# Patient Record
Sex: Female | Born: 1950
Health system: Southern US, Community
[De-identification: ages and names within clinical notes are randomized; demographics above are authoritative.]

## PROBLEM LIST (undated history)

## (undated) DIAGNOSIS — M1990 Unspecified osteoarthritis, unspecified site: Secondary | ICD-10-CM

## (undated) DIAGNOSIS — I2510 Atherosclerotic heart disease of native coronary artery without angina pectoris: Secondary | ICD-10-CM

## (undated) DIAGNOSIS — K219 Gastro-esophageal reflux disease without esophagitis: Secondary | ICD-10-CM

## (undated) DIAGNOSIS — E119 Type 2 diabetes mellitus without complications: Secondary | ICD-10-CM

## (undated) DIAGNOSIS — Z9889 Other specified postprocedural states: Secondary | ICD-10-CM

## (undated) DIAGNOSIS — E782 Mixed hyperlipidemia: Secondary | ICD-10-CM

## (undated) DIAGNOSIS — G4700 Insomnia, unspecified: Secondary | ICD-10-CM

## (undated) DIAGNOSIS — R51 Headache: Secondary | ICD-10-CM

## (undated) DIAGNOSIS — R112 Nausea with vomiting, unspecified: Secondary | ICD-10-CM

## (undated) DIAGNOSIS — R519 Headache, unspecified: Secondary | ICD-10-CM

## (undated) DIAGNOSIS — I499 Cardiac arrhythmia, unspecified: Secondary | ICD-10-CM

## (undated) DIAGNOSIS — Z8659 Personal history of other mental and behavioral disorders: Secondary | ICD-10-CM

## (undated) DIAGNOSIS — J189 Pneumonia, unspecified organism: Secondary | ICD-10-CM

## (undated) DIAGNOSIS — T8859XA Other complications of anesthesia, initial encounter: Secondary | ICD-10-CM

## (undated) DIAGNOSIS — T4145XA Adverse effect of unspecified anesthetic, initial encounter: Secondary | ICD-10-CM

## (undated) DIAGNOSIS — K7581 Nonalcoholic steatohepatitis (NASH): Secondary | ICD-10-CM

## (undated) DIAGNOSIS — I6529 Occlusion and stenosis of unspecified carotid artery: Secondary | ICD-10-CM

## (undated) DIAGNOSIS — I10 Essential (primary) hypertension: Secondary | ICD-10-CM

## (undated) HISTORY — PX: BUNIONECTOMY: SHX129

## (undated) HISTORY — PX: CHOLECYSTECTOMY: SHX55

## (undated) HISTORY — PX: CARDIAC CATHETERIZATION: SHX172

## (undated) HISTORY — PX: TUBAL LIGATION: SHX77

## (undated) HISTORY — PX: GANGLION CYST EXCISION: SHX1691

## (undated) HISTORY — PX: SHOULDER ARTHROSCOPY W/ ROTATOR CUFF REPAIR: SHX2400

## (undated) HISTORY — PX: CARPAL TUNNEL RELEASE: SHX101

## (undated) HISTORY — PX: MYELOGRAM: SHX5347

## (undated) HISTORY — PX: BACK SURGERY: SHX140

---

## 2014-01-30 ENCOUNTER — Other Ambulatory Visit: Payer: Self-pay

## 2014-01-30 DIAGNOSIS — Z1231 Encounter for screening mammogram for malignant neoplasm of breast: Secondary | ICD-10-CM

## 2014-03-02 ENCOUNTER — Ambulatory Visit
Admission: RE | Admit: 2014-03-02 | Discharge: 2014-03-02 | Disposition: A | Discharge status: Home / Self Care | Payer: BC Managed Care – PPO | Source: Ambulatory Visit

## 2014-03-02 ENCOUNTER — Other Ambulatory Visit: Payer: Self-pay | Admitting: Neurosurgery

## 2014-03-02 DIAGNOSIS — Z1231 Encounter for screening mammogram for malignant neoplasm of breast: Secondary | ICD-10-CM

## 2014-03-02 DIAGNOSIS — M545 Low back pain: Secondary | ICD-10-CM

## 2014-03-02 DIAGNOSIS — M549 Dorsalgia, unspecified: Secondary | ICD-10-CM

## 2014-03-13 ENCOUNTER — Ambulatory Visit
Admission: RE | Admit: 2014-03-13 | Discharge: 2014-03-13 | Disposition: A | Discharge status: Home / Self Care | Payer: BC Managed Care – PPO | Source: Ambulatory Visit | Attending: Neurosurgery | Admitting: Neurosurgery

## 2014-03-13 VITALS — BP 146/66

## 2014-03-13 DIAGNOSIS — M545 Low back pain: Secondary | ICD-10-CM

## 2014-03-13 DIAGNOSIS — M549 Dorsalgia, unspecified: Secondary | ICD-10-CM

## 2014-03-13 MED ORDER — IOHEXOL 300 MG/ML  SOLN
10.0000 mL | Freq: Once | INTRAMUSCULAR | Status: AC | PRN
  Administered 2014-03-13: 10 mL via INTRATHECAL

## 2014-03-13 MED ORDER — MEPERIDINE HCL 100 MG/ML IJ SOLN
100.0000 mg | Freq: Once | INTRAMUSCULAR | Status: AC
  Administered 2014-03-13: 100 mg via INTRAMUSCULAR

## 2014-03-13 MED ORDER — DIAZEPAM 5 MG PO TABS
10.0000 mg | ORAL_TABLET | Freq: Once | ORAL | Status: AC
  Administered 2014-03-13: 10 mg via ORAL

## 2014-03-13 MED ORDER — ONDANSETRON HCL 4 MG/2ML IJ SOLN
4.0000 mg | Freq: Once | INTRAMUSCULAR | Status: AC
  Administered 2014-03-13: 4 mg via INTRAMUSCULAR

## 2014-03-13 NOTE — Progress Notes (Signed)
Pt states she has been off Sertraline for the past several days.

## 2014-03-13 NOTE — Discharge Instructions (Signed)
Myelogram Discharge Instructions  1. Go home and rest quietly for the next 24 hours.  It is important to lie flat for the next 24 hours.  Get up only to go to the restroom.  You may lie in the bed or on a couch on your back, your stomach, your left side or your right side.  You may have one pillow under your head.  You may have pillows between your knees while you are on your side or under your knees while you are on your back.  2. DO NOT drive today.  Recline the seat as far back as it will go, while still wearing your seat belt, on the way home.  3. You may get up to go to the bathroom as needed.  You may sit up for 10 minutes to eat.  You may resume your normal diet and medications unless otherwise indicated.  Drink lots of extra fluids today and tomorrow.  4. The incidence of headache, nausea, or vomiting is about 5% (one in 20 patients).  If you develop a headache, lie flat and drink plenty of fluids until the headache goes away.  Caffeinated beverages may be helpful.  If you develop severe nausea and vomiting or a headache that does not go away with flat bed rest, call [phone].  5. You may resume normal activities after your 24 hours of bed rest is over; however, do not exert yourself strongly or do any heavy lifting tomorrow. If when you get up you have a headache when standing, go back to bed and force fluids for another 24 hours.  6. Call your physician for a follow-up appointment.  The results of your myelogram will be sent directly to your physician by the following day.  7. If you have any questions or if complications develop after you arrive home, please call [phone].  Discharge instructions have been explained to the patient.  The patient, or the person responsible for the patient, fully understands these instructions.    Myelogram Discharge Instructions  8. Go home and rest quietly for the next 24 hours.  It is important to lie flat for the next 24 hours.  Get up only to  go to the restroom.  You may lie in the bed or on a couch on your back, your stomach, your left side or your right side.  You may have one pillow under your head.  You may have pillows between your knees while you are on your side or under your knees while you are on your back.  9. DO NOT drive today.  Recline the seat as far back as it will go, while still wearing your seat belt, on the way home.  10. You may get up to go to the bathroom as needed.  You may sit up for 10 minutes to eat.  You may resume your normal diet and medications unless otherwise indicated.  Drink lots of extra fluids today and tomorrow.  11. The incidence of headache, nausea, or vomiting is about 5% (one in 20 patients).  If you develop a headache, lie flat and drink plenty of fluids until the headache goes away.  Caffeinated beverages may be helpful.  If you develop severe nausea and vomiting or a headache that does not go away with flat bed rest, call [phone].  12. You may resume normal activities after your 24 hours of bed rest is over; however, do not exert yourself strongly or do any heavy lifting tomorrow. If  when you get up you have a headache when standing, go back to bed and force fluids for another 24 hours.  13. Call your physician for a follow-up appointment.  The results of your myelogram will be sent directly to your physician by the following day.  14. If you have any questions or if complications develop after you arrive home, please call [phone].  Discharge instructions have been explained to the patient.  The patient, or the person responsible for the patient, fully understands these instructions.    Myelogram Discharge Instructions  15. Go home and rest quietly for the next 24 hours.  It is important to lie flat for the next 24 hours.  Get up only to go to the restroom.  You may lie in the bed or on a couch on your back, your stomach, your left side or your right side.  You may have one pillow  under your head.  You may have pillows between your knees while you are on your side or under your knees while you are on your back.  16. DO NOT drive today.  Recline the seat as far back as it will go, while still wearing your seat belt, on the way home.  17. You may get up to go to the bathroom as needed.  You may sit up for 10 minutes to eat.  You may resume your normal diet and medications unless otherwise indicated.  Drink lots of extra fluids today and tomorrow.  18. The incidence of headache, nausea, or vomiting is about 5% (one in 20 patients).  If you develop a headache, lie flat and drink plenty of fluids until the headache goes away.  Caffeinated beverages may be helpful.  If you develop severe nausea and vomiting or a headache that does not go away with flat bed rest, call [phone].  19. You may resume normal activities after your 24 hours of bed rest is over; however, do not exert yourself strongly or do any heavy lifting tomorrow. If when you get up you have a headache when standing, go back to bed and force fluids for another 24 hours.  20. Call your physician for a follow-up appointment.  The results of your myelogram will be sent directly to your physician by the following day.  21. If you have any questions or if complications develop after you arrive home, please call [phone].  Discharge instructions have been explained to the patient.  The patient, or the person responsible for the patient, fully understands these instructions.      May resume Sertraline on Sept. 30, 2015, after 11:00 am.

## 2014-03-20 DIAGNOSIS — M5416 Radiculopathy, lumbar region: Secondary | ICD-10-CM | POA: Insufficient documentation

## 2014-03-21 ENCOUNTER — Other Ambulatory Visit (HOSPITAL_COMMUNITY): Payer: Self-pay | Admitting: Neurosurgery

## 2014-03-23 ENCOUNTER — Encounter (HOSPITAL_COMMUNITY): Payer: Self-pay | Admitting: Pharmacy Technician

## 2014-03-29 NOTE — Pre-Procedure Instructions (Signed)
Elaine Roach  03/29/2014   Your procedure is scheduled on:  Wednesday, Oct. 21st   Report to [facility] Admitting at  10:00 AM, (this is per your Surgeon's request.)               Call this number if you have problems the morning of surgery: [phone]   Remember:   Do not eat food or drink liquids after midnight Tuesday.     Take these medicines the morning of surgery with A SIP OF WATER: Prilosec, Zoloft.              DO NOT TAKE ANY DIABETES MEDICATION THE MORNING OF SURGERY.   Do not wear jewelry, make-up or nail polish.  Do not wear lotions, powders, or perfumes. You may not wear deodorant the morning of surgery.  Do not shave underarms & legs 48 hours prior to surgery.    Do not bring valuables to the hospital.  [facility] is not responsible for any belongings or valuables.               Contacts, dentures or bridgework may not be worn into surgery.  Leave suitcase in the car. After surgery it may be brought to your room.  For patients admitted to the hospital, discharge time is determined by your treatment team.               Name and phone number of your driver:    Special Instructions: "Preparing for Surgery" instruction sheet.   Please read over the following fact sheets that you were given: Pain Booklet, Coughing and Deep Breathing, Blood Transfusion Information, MRSA Information and Surgical Site Infection Prevention

## 2014-03-30 ENCOUNTER — Encounter (HOSPITAL_COMMUNITY)
Admission: RE | Admit: 2014-03-30 | Discharge: 2014-03-30 | Disposition: A | Discharge status: Home / Self Care | Payer: BC Managed Care – PPO | Source: Ambulatory Visit | Attending: Neurosurgery | Admitting: Neurosurgery

## 2014-03-30 ENCOUNTER — Ambulatory Visit (HOSPITAL_COMMUNITY)
Admission: RE | Admit: 2014-03-30 | Discharge: 2014-03-30 | Disposition: A | Discharge status: Home / Self Care | Payer: BC Managed Care – PPO | Source: Ambulatory Visit | Attending: Anesthesiology | Admitting: Anesthesiology

## 2014-03-30 ENCOUNTER — Encounter (HOSPITAL_COMMUNITY): Payer: Self-pay

## 2014-03-30 DIAGNOSIS — M5126 Other intervertebral disc displacement, lumbar region: Secondary | ICD-10-CM | POA: Diagnosis not present

## 2014-03-30 DIAGNOSIS — Z01811 Encounter for preprocedural respiratory examination: Secondary | ICD-10-CM | POA: Diagnosis present

## 2014-03-30 HISTORY — DX: Adverse effect of unspecified anesthetic, initial encounter: T41.45XA

## 2014-03-30 HISTORY — DX: Nausea with vomiting, unspecified: R11.2

## 2014-03-30 HISTORY — DX: Essential (primary) hypertension: I10

## 2014-03-30 HISTORY — DX: Cardiac arrhythmia, unspecified: I49.9

## 2014-03-30 HISTORY — DX: Gastro-esophageal reflux disease without esophagitis: K21.9

## 2014-03-30 HISTORY — DX: Unspecified osteoarthritis, unspecified site: M19.90

## 2014-03-30 HISTORY — DX: Type 2 diabetes mellitus without complications: E11.9

## 2014-03-30 HISTORY — DX: Other complications of anesthesia, initial encounter: T88.59XA

## 2014-03-30 HISTORY — DX: Personal history of other mental and behavioral disorders: Z86.59

## 2014-03-30 HISTORY — DX: Nonalcoholic steatohepatitis (NASH): K75.81

## 2014-03-30 HISTORY — DX: Other specified postprocedural states: Z98.890

## 2014-03-30 LAB — CBC
HCT: 40.7 % (ref 36.0–46.0)
Hemoglobin: 13.3 g/dL (ref 12.0–15.0)
MCH: 28.1 pg (ref 26.0–34.0)
MCHC: 32.7 g/dL (ref 30.0–36.0)
MCV: 86 fL (ref 78.0–100.0)
Platelets: 277 10*3/uL (ref 150–400)
RBC: 4.73 MIL/uL (ref 3.87–5.11)
RDW: 14 % (ref 11.5–15.5)
WBC: 5.5 10*3/uL (ref 4.0–10.5)

## 2014-03-30 LAB — SURGICAL PCR SCREEN
MRSA, PCR: NEGATIVE
Staphylococcus aureus: NEGATIVE

## 2014-03-30 LAB — BASIC METABOLIC PANEL
Anion gap: 15 (ref 5–15)
BUN: 22 mg/dL (ref 6–23)
CO2: 23 mEq/L (ref 19–32)
Calcium: 10.4 mg/dL (ref 8.4–10.5)
Chloride: 100 mEq/L (ref 96–112)
Creatinine, Ser: 0.81 mg/dL (ref 0.50–1.10)
GFR calc Af Amer: 88 mL/min — ABNORMAL LOW (ref >90)
GFR calc non Af Amer: 76 mL/min — ABNORMAL LOW (ref >90)
Glucose, Bld: 109 mg/dL — ABNORMAL HIGH (ref 70–99)
Potassium: 4.3 mEq/L (ref 3.7–5.3)
Sodium: 138 mEq/L (ref 137–147)

## 2014-03-30 LAB — ABO/RH: ABO/RH(D): A POS

## 2014-03-30 LAB — TYPE AND SCREEN
ABO/RH(D): A POS
Antibody Screen: NEGATIVE

## 2014-03-30 NOTE — Progress Notes (Signed)
Anesthesia Chart Review:   Pt is 63 year old female scheduled for L1-2 PLIF with interbody prosthesis, posterior lateral arthrodesis and posterior segmental instrumentation with exploration of fusion on 04/04/2014 with Dr. Arnoldo Morale.   PMH: HTN, DM, nonalcoholic steatohepatitis, dysrhythmia (PVCs), GERD, depression. Heavy smoker.   Medications include: lisinopril/hctz, metformin, pravastatin, prilosec, zoloft  Preoperative labs reviewed.    Chest x-ray reviewed. No acute abnormalities.   EKG: NSR. Cannot rule out anterior infarct, age undetermined.   No sx documented at PAT.  If no changes, I anticipate pt can proceed with surgery as scheduled.   Willeen Cass, FNP-BC [facility]/Anesthesiology Phone: [phone] 03/30/2014 3:55 PM

## 2014-03-30 NOTE — Progress Notes (Addendum)
Patient was having some "beats" was tested 15-18 yrs ago in [place]...wore a holter monitor and it showed benign PVC's.....Marland Kitchenstill has them every now and then.   Gets 'flutters'  2-3 times a week.....Marland Kitchenthen they go away.   DA Today's EKG 'abnormal'.Marland KitchenMarland KitchenShe did not have an old one to compare.  DA

## 2014-04-03 MED ORDER — CEFAZOLIN SODIUM-DEXTROSE 2-3 GM-% IV SOLR
2.0000 g | INTRAVENOUS | Status: AC
  Administered 2014-04-04: 2 g via INTRAVENOUS
  Filled 2014-04-03: qty 50

## 2014-04-04 ENCOUNTER — Inpatient Hospital Stay (HOSPITAL_COMMUNITY)
Admission: RE | Admit: 2014-04-04 | Discharge: 2014-04-05 | DRG: 460 | Disposition: A | Discharge status: Home / Self Care | Payer: BC Managed Care – PPO | Source: Ambulatory Visit | Attending: Neurosurgery | Admitting: Neurosurgery

## 2014-04-04 ENCOUNTER — Encounter (HOSPITAL_COMMUNITY): Payer: BC Managed Care – PPO | Admitting: Emergency Medicine

## 2014-04-04 ENCOUNTER — Inpatient Hospital Stay (HOSPITAL_COMMUNITY): Payer: BC Managed Care – PPO | Admitting: Anesthesiology

## 2014-04-04 ENCOUNTER — Encounter (HOSPITAL_COMMUNITY): Payer: Self-pay | Admitting: *Deleted

## 2014-04-04 ENCOUNTER — Encounter (HOSPITAL_COMMUNITY)
Admission: RE | Disposition: A | Discharge status: Home / Self Care | Payer: Self-pay | Source: Ambulatory Visit | Attending: Neurosurgery

## 2014-04-04 ENCOUNTER — Inpatient Hospital Stay (HOSPITAL_COMMUNITY): Payer: BC Managed Care – PPO

## 2014-04-04 DIAGNOSIS — M4806 Spinal stenosis, lumbar region: Secondary | ICD-10-CM | POA: Diagnosis present

## 2014-04-04 DIAGNOSIS — M5136 Other intervertebral disc degeneration, lumbar region: Secondary | ICD-10-CM | POA: Diagnosis present

## 2014-04-04 DIAGNOSIS — M129 Arthropathy, unspecified: Secondary | ICD-10-CM | POA: Diagnosis present

## 2014-04-04 DIAGNOSIS — M5416 Radiculopathy, lumbar region: Secondary | ICD-10-CM | POA: Diagnosis present

## 2014-04-04 DIAGNOSIS — M48062 Spinal stenosis, lumbar region with neurogenic claudication: Secondary | ICD-10-CM | POA: Diagnosis present

## 2014-04-04 DIAGNOSIS — Z981 Arthrodesis status: Secondary | ICD-10-CM | POA: Diagnosis not present

## 2014-04-04 DIAGNOSIS — K219 Gastro-esophageal reflux disease without esophagitis: Secondary | ICD-10-CM | POA: Diagnosis present

## 2014-04-04 DIAGNOSIS — M1990 Unspecified osteoarthritis, unspecified site: Secondary | ICD-10-CM | POA: Diagnosis present

## 2014-04-04 DIAGNOSIS — E669 Obesity, unspecified: Secondary | ICD-10-CM | POA: Diagnosis present

## 2014-04-04 DIAGNOSIS — M48061 Spinal stenosis, lumbar region without neurogenic claudication: Secondary | ICD-10-CM

## 2014-04-04 DIAGNOSIS — F42 Obsessive-compulsive disorder: Secondary | ICD-10-CM | POA: Diagnosis present

## 2014-04-04 DIAGNOSIS — F17210 Nicotine dependence, cigarettes, uncomplicated: Secondary | ICD-10-CM | POA: Diagnosis present

## 2014-04-04 DIAGNOSIS — F329 Major depressive disorder, single episode, unspecified: Secondary | ICD-10-CM | POA: Diagnosis present

## 2014-04-04 DIAGNOSIS — K7581 Nonalcoholic steatohepatitis (NASH): Secondary | ICD-10-CM | POA: Diagnosis present

## 2014-04-04 DIAGNOSIS — I10 Essential (primary) hypertension: Secondary | ICD-10-CM | POA: Diagnosis present

## 2014-04-04 DIAGNOSIS — E119 Type 2 diabetes mellitus without complications: Secondary | ICD-10-CM | POA: Diagnosis present

## 2014-04-04 LAB — GLUCOSE, CAPILLARY
Glucose-Capillary: 118 mg/dL — ABNORMAL HIGH (ref 70–99)
Glucose-Capillary: 153 mg/dL — ABNORMAL HIGH (ref 70–99)
Glucose-Capillary: 185 mg/dL — ABNORMAL HIGH (ref 70–99)

## 2014-04-04 SURGERY — POSTERIOR LUMBAR FUSION 1 WITH HARDWARE REMOVAL
Anesthesia: General

## 2014-04-04 MED ORDER — FENTANYL CITRATE 0.05 MG/ML IJ SOLN
INTRAMUSCULAR | Status: AC
  Filled 2014-04-04: qty 5

## 2014-04-04 MED ORDER — DIPHENHYDRAMINE HCL 50 MG/ML IJ SOLN: 10.0000 mg | Freq: Once | INTRAMUSCULAR | Status: DC

## 2014-04-04 MED ORDER — ONDANSETRON HCL 4 MG/2ML IJ SOLN
INTRAMUSCULAR | Status: DC | PRN
  Administered 2014-04-04: 4 mg via INTRAVENOUS

## 2014-04-04 MED ORDER — ROCURONIUM BROMIDE 50 MG/5ML IV SOLN
INTRAVENOUS | Status: AC
  Filled 2014-04-04: qty 1

## 2014-04-04 MED ORDER — ALUM & MAG HYDROXIDE-SIMETH 200-200-20 MG/5ML PO SUSP: 30.0000 mL | Freq: Four times a day (QID) | ORAL | Status: DC | PRN

## 2014-04-04 MED ORDER — HYDROCHLOROTHIAZIDE 12.5 MG PO CAPS
12.5000 mg | ORAL_CAPSULE | Freq: Every day | ORAL | Status: DC
  Administered 2014-04-05: 12.5 mg via ORAL
  Filled 2014-04-04: qty 1

## 2014-04-04 MED ORDER — OXYCODONE HCL 5 MG/5ML PO SOLN: 5.0000 mg | Freq: Once | ORAL | Status: AC | PRN

## 2014-04-04 MED ORDER — PROPOFOL 10 MG/ML IV BOLUS
INTRAVENOUS | Status: DC | PRN
  Administered 2014-04-04: 100 mg via INTRAVENOUS

## 2014-04-04 MED ORDER — BUPIVACAINE LIPOSOME 1.3 % IJ SUSP
20.0000 mL | INTRAMUSCULAR | Status: AC
  Filled 2014-04-04: qty 20

## 2014-04-04 MED ORDER — GLYCOPYRROLATE 0.2 MG/ML IJ SOLN
INTRAMUSCULAR | Status: AC
  Filled 2014-04-04: qty 3

## 2014-04-04 MED ORDER — SODIUM CHLORIDE 0.9 % IR SOLN
Status: DC | PRN
  Administered 2014-04-04: 15:00:00

## 2014-04-04 MED ORDER — PANTOPRAZOLE SODIUM 40 MG PO TBEC
80.0000 mg | DELAYED_RELEASE_TABLET | Freq: Every day | ORAL | Status: DC
  Administered 2014-04-05: 80 mg via ORAL
  Filled 2014-04-04: qty 2

## 2014-04-04 MED ORDER — DEXAMETHASONE SODIUM PHOSPHATE 4 MG/ML IJ SOLN
INTRAMUSCULAR | Status: AC
  Filled 2014-04-04: qty 2

## 2014-04-04 MED ORDER — METFORMIN HCL 500 MG PO TABS
500.0000 mg | ORAL_TABLET | Freq: Two times a day (BID) | ORAL | Status: DC
  Administered 2014-04-05: 500 mg via ORAL
  Filled 2014-04-04 (×3): qty 1

## 2014-04-04 MED ORDER — DEXAMETHASONE SODIUM PHOSPHATE 4 MG/ML IJ SOLN
INTRAMUSCULAR | Status: DC | PRN
  Administered 2014-04-04: 8 mg via INTRAVENOUS

## 2014-04-04 MED ORDER — BUPIVACAINE-EPINEPHRINE (PF) 0.5% -1:200000 IJ SOLN
INTRAMUSCULAR | Status: DC | PRN
  Administered 2014-04-04: 10 mL via PERINEURAL

## 2014-04-04 MED ORDER — LACTATED RINGERS IV SOLN: INTRAVENOUS | Status: DC

## 2014-04-04 MED ORDER — BUPIVACAINE LIPOSOME 1.3 % IJ SUSP
INTRAMUSCULAR | Status: DC | PRN
  Administered 2014-04-04: 20 mL

## 2014-04-04 MED ORDER — MIDAZOLAM HCL 2 MG/2ML IJ SOLN: 0.5000 mg | Freq: Once | INTRAMUSCULAR | Status: DC | PRN

## 2014-04-04 MED ORDER — INSULIN ASPART 100 UNIT/ML ~~LOC~~ SOLN
0.0000 [IU] | SUBCUTANEOUS | Status: DC
Start: 2014-04-04 — End: 2014-04-04

## 2014-04-04 MED ORDER — NEOSTIGMINE METHYLSULFATE 10 MG/10ML IV SOLN
INTRAVENOUS | Status: AC
  Filled 2014-04-04: qty 1

## 2014-04-04 MED ORDER — ACETAMINOPHEN 325 MG PO TABS: 650.0000 mg | ORAL_TABLET | ORAL | Status: DC | PRN

## 2014-04-04 MED ORDER — ONDANSETRON HCL 4 MG/2ML IJ SOLN
INTRAMUSCULAR | Status: AC
  Filled 2014-04-04: qty 2

## 2014-04-04 MED ORDER — LIDOCAINE HCL (CARDIAC) 20 MG/ML IV SOLN
INTRAVENOUS | Status: AC
  Filled 2014-04-04: qty 5

## 2014-04-04 MED ORDER — THROMBIN 20000 UNITS EX SOLR
CUTANEOUS | Status: DC | PRN
  Administered 2014-04-04: 15:00:00 via TOPICAL

## 2014-04-04 MED ORDER — PROPOFOL 10 MG/ML IV BOLUS
INTRAVENOUS | Status: AC
  Filled 2014-04-04: qty 20

## 2014-04-04 MED ORDER — MENTHOL 3 MG MT LOZG: 1.0000 | LOZENGE | OROMUCOSAL | Status: DC | PRN

## 2014-04-04 MED ORDER — INSULIN ASPART 100 UNIT/ML ~~LOC~~ SOLN: 0.0000 [IU] | Freq: Every day | SUBCUTANEOUS | Status: DC

## 2014-04-04 MED ORDER — ARTIFICIAL TEARS OP OINT
TOPICAL_OINTMENT | OPHTHALMIC | Status: DC | PRN
  Administered 2014-04-04: 1 via OPHTHALMIC

## 2014-04-04 MED ORDER — CEFAZOLIN SODIUM-DEXTROSE 2-3 GM-% IV SOLR
2.0000 g | Freq: Three times a day (TID) | INTRAVENOUS | Status: AC
Start: 2014-04-04 — End: 2014-04-05
  Administered 2014-04-04: 2 g via INTRAVENOUS
  Filled 2014-04-04 (×2): qty 50

## 2014-04-04 MED ORDER — INSULIN ASPART 100 UNIT/ML ~~LOC~~ SOLN: 0.0000 [IU] | Freq: Three times a day (TID) | SUBCUTANEOUS | Status: DC

## 2014-04-04 MED ORDER — OXYCODONE HCL 5 MG PO TABS
5.0000 mg | ORAL_TABLET | Freq: Once | ORAL | Status: AC | PRN
  Administered 2014-04-04: 5 mg via ORAL

## 2014-04-04 MED ORDER — LACTATED RINGERS IV SOLN
INTRAVENOUS | Status: DC | PRN
  Administered 2014-04-04 (×3): via INTRAVENOUS

## 2014-04-04 MED ORDER — HYDROMORPHONE HCL 1 MG/ML IJ SOLN
INTRAMUSCULAR | Status: AC
  Filled 2014-04-04: qty 1

## 2014-04-04 MED ORDER — BACITRACIN ZINC 500 UNIT/GM EX OINT
TOPICAL_OINTMENT | CUTANEOUS | Status: DC | PRN
  Administered 2014-04-04: 1 via TOPICAL

## 2014-04-04 MED ORDER — MORPHINE SULFATE 2 MG/ML IJ SOLN: 1.0000 mg | INTRAMUSCULAR | Status: DC | PRN

## 2014-04-04 MED ORDER — ROCURONIUM BROMIDE 100 MG/10ML IV SOLN
INTRAVENOUS | Status: DC | PRN
  Administered 2014-04-04: 10 mg via INTRAVENOUS
  Administered 2014-04-04: 40 mg via INTRAVENOUS
  Administered 2014-04-04: 20 mg via INTRAVENOUS

## 2014-04-04 MED ORDER — 0.9 % SODIUM CHLORIDE (POUR BTL) OPTIME
TOPICAL | Status: DC | PRN
  Administered 2014-04-04: 1000 mL

## 2014-04-04 MED ORDER — INFLUENZA VAC SPLIT QUAD 0.5 ML IM SUSY
0.5000 mL | PREFILLED_SYRINGE | INTRAMUSCULAR | Status: AC
  Administered 2014-04-05: 0.5 mL via INTRAMUSCULAR
  Filled 2014-04-04: qty 0.5

## 2014-04-04 MED ORDER — OXYCODONE-ACETAMINOPHEN 5-325 MG PO TABS
1.0000 | ORAL_TABLET | ORAL | Status: DC | PRN
  Administered 2014-04-04 – 2014-04-05 (×3): 2 via ORAL
  Filled 2014-04-04 (×3): qty 2

## 2014-04-04 MED ORDER — PROMETHAZINE HCL 25 MG/ML IJ SOLN: 6.2500 mg | INTRAMUSCULAR | Status: DC | PRN

## 2014-04-04 MED ORDER — PHENOL 1.4 % MT LIQD: 1.0000 | OROMUCOSAL | Status: DC | PRN

## 2014-04-04 MED ORDER — HYDROMORPHONE HCL 1 MG/ML IJ SOLN
0.2500 mg | INTRAMUSCULAR | Status: DC | PRN
Start: 2014-04-04 — End: 2014-04-04
  Administered 2014-04-04 (×4): 0.5 mg via INTRAVENOUS

## 2014-04-04 MED ORDER — EPHEDRINE SULFATE 50 MG/ML IJ SOLN
INTRAMUSCULAR | Status: DC | PRN
  Administered 2014-04-04: 5 mg via INTRAVENOUS

## 2014-04-04 MED ORDER — SCOPOLAMINE 1 MG/3DAYS TD PT72: 1.0000 | MEDICATED_PATCH | Freq: Once | TRANSDERMAL | Status: DC

## 2014-04-04 MED ORDER — SERTRALINE HCL 100 MG PO TABS
100.0000 mg | ORAL_TABLET | Freq: Every day | ORAL | Status: DC
  Administered 2014-04-05: 100 mg via ORAL
  Filled 2014-04-04: qty 1

## 2014-04-04 MED ORDER — DIPHENHYDRAMINE HCL 50 MG/ML IJ SOLN
INTRAMUSCULAR | Status: DC | PRN
  Administered 2014-04-04: 12.5 mg via INTRAVENOUS

## 2014-04-04 MED ORDER — PHENYLEPHRINE HCL 10 MG/ML IJ SOLN
INTRAMUSCULAR | Status: DC | PRN
  Administered 2014-04-04: 40 ug via INTRAVENOUS

## 2014-04-04 MED ORDER — MEPERIDINE HCL 25 MG/ML IJ SOLN: 6.2500 mg | INTRAMUSCULAR | Status: DC | PRN

## 2014-04-04 MED ORDER — PRAVASTATIN SODIUM 20 MG PO TABS
20.0000 mg | ORAL_TABLET | Freq: Every day | ORAL | Status: DC
  Administered 2014-04-04: 20 mg via ORAL
  Filled 2014-04-04 (×2): qty 1

## 2014-04-04 MED ORDER — ADULT MULTIVITAMIN W/MINERALS CH
1.0000 | ORAL_TABLET | Freq: Every day | ORAL | Status: DC
  Administered 2014-04-05: 1 via ORAL
  Filled 2014-04-04: qty 1

## 2014-04-04 MED ORDER — DIPHENHYDRAMINE HCL 50 MG/ML IJ SOLN
INTRAMUSCULAR | Status: AC
  Filled 2014-04-04: qty 1

## 2014-04-04 MED ORDER — LISINOPRIL-HYDROCHLOROTHIAZIDE 20-12.5 MG PO TABS: 1.0000 | ORAL_TABLET | Freq: Every day | ORAL | Status: DC

## 2014-04-04 MED ORDER — MIDAZOLAM HCL 2 MG/2ML IJ SOLN
INTRAMUSCULAR | Status: AC
  Filled 2014-04-04: qty 2

## 2014-04-04 MED ORDER — LISINOPRIL 20 MG PO TABS
20.0000 mg | ORAL_TABLET | Freq: Every day | ORAL | Status: DC
  Administered 2014-04-05: 20 mg via ORAL
  Filled 2014-04-04: qty 1

## 2014-04-04 MED ORDER — ONDANSETRON HCL 4 MG/2ML IJ SOLN: 4.0000 mg | INTRAMUSCULAR | Status: DC | PRN

## 2014-04-04 MED ORDER — LACTATED RINGERS IV SOLN
INTRAVENOUS | Status: DC
  Administered 2014-04-04: 12:00:00 via INTRAVENOUS

## 2014-04-04 MED ORDER — SUCCINYLCHOLINE CHLORIDE 20 MG/ML IJ SOLN
INTRAMUSCULAR | Status: AC
  Filled 2014-04-04: qty 1

## 2014-04-04 MED ORDER — DOCUSATE SODIUM 100 MG PO CAPS
100.0000 mg | ORAL_CAPSULE | Freq: Two times a day (BID) | ORAL | Status: DC
  Administered 2014-04-04 – 2014-04-05 (×2): 100 mg via ORAL
  Filled 2014-04-04 (×3): qty 1

## 2014-04-04 MED ORDER — FENTANYL CITRATE 0.05 MG/ML IJ SOLN
INTRAMUSCULAR | Status: DC | PRN
  Administered 2014-04-04: 50 ug via INTRAVENOUS
  Administered 2014-04-04: 25 ug via INTRAVENOUS
  Administered 2014-04-04: 50 ug via INTRAVENOUS
  Administered 2014-04-04: 25 ug via INTRAVENOUS
  Administered 2014-04-04: 50 ug via INTRAVENOUS
  Administered 2014-04-04: 200 ug via INTRAVENOUS
  Administered 2014-04-04 (×2): 50 ug via INTRAVENOUS

## 2014-04-04 MED ORDER — SCOPOLAMINE 1 MG/3DAYS TD PT72
MEDICATED_PATCH | TRANSDERMAL | Status: AC
  Filled 2014-04-04: qty 1

## 2014-04-04 MED ORDER — HYDROCODONE-ACETAMINOPHEN 5-325 MG PO TABS: 1.0000 | ORAL_TABLET | ORAL | Status: DC | PRN

## 2014-04-04 MED ORDER — ACETAMINOPHEN 650 MG RE SUPP: 650.0000 mg | RECTAL | Status: DC | PRN

## 2014-04-04 MED ORDER — MIDAZOLAM HCL 5 MG/5ML IJ SOLN
INTRAMUSCULAR | Status: DC | PRN
  Administered 2014-04-04: 2 mg via INTRAVENOUS

## 2014-04-04 MED ORDER — DIAZEPAM 5 MG PO TABS
ORAL_TABLET | ORAL | Status: AC
  Filled 2014-04-04: qty 1

## 2014-04-04 MED ORDER — DIAZEPAM 5 MG PO TABS
5.0000 mg | ORAL_TABLET | Freq: Four times a day (QID) | ORAL | Status: DC | PRN
  Administered 2014-04-04: 5 mg via ORAL

## 2014-04-04 MED ORDER — OXYCODONE HCL 5 MG PO TABS
ORAL_TABLET | ORAL | Status: AC
  Filled 2014-04-04: qty 1

## 2014-04-04 SURGICAL SUPPLY — 70 items
BAG DECANTER FOR FLEXI CONT (MISCELLANEOUS) ×2 IMPLANT
BENZOIN TINCTURE PRP APPL 2/3 (GAUZE/BANDAGES/DRESSINGS) ×2 IMPLANT
BLADE CLIPPER SURG (BLADE) IMPLANT
BRUSH SCRUB EZ PLAIN DRY (MISCELLANEOUS) ×2 IMPLANT
BUR MATCHSTICK NEURO 3.0 LAGG (BURR) ×2 IMPLANT
BUR PRECISION FLUTE 6.0 (BURR) ×2 IMPLANT
CANISTER SUCT 3000ML (MISCELLANEOUS) ×2 IMPLANT
CAP LOCKING REVERE (Cap) ×12 IMPLANT
CONT SPEC 4OZ CLIKSEAL STRL BL (MISCELLANEOUS) ×2 IMPLANT
COVER BACK TABLE 24X17X13 BIG (DRAPES) IMPLANT
DRAPE C-ARM 42X72 X-RAY (DRAPES) ×4 IMPLANT
DRAPE LAPAROTOMY 100X72X124 (DRAPES) ×2 IMPLANT
DRAPE POUCH INSTRU U-SHP 10X18 (DRAPES) ×2 IMPLANT
DRAPE PROXIMA HALF (DRAPES) ×2 IMPLANT
DRAPE SURG 17X23 STRL (DRAPES) ×8 IMPLANT
ELECT BLADE 4.0 EZ CLEAN MEGAD (MISCELLANEOUS) ×2
ELECT REM PT RETURN 9FT ADLT (ELECTROSURGICAL) ×2
ELECTRODE BLDE 4.0 EZ CLN MEGD (MISCELLANEOUS) ×1 IMPLANT
ELECTRODE REM PT RTRN 9FT ADLT (ELECTROSURGICAL) ×1 IMPLANT
EVACUATOR 1/8 PVC DRAIN (DRAIN) ×2 IMPLANT
GAUZE SPONGE 4X4 12PLY STRL (GAUZE/BANDAGES/DRESSINGS) ×2 IMPLANT
GAUZE SPONGE 4X4 16PLY XRAY LF (GAUZE/BANDAGES/DRESSINGS) ×2 IMPLANT
GLOVE BIO SURGEON STRL SZ8 (GLOVE) ×2 IMPLANT
GLOVE BIO SURGEON STRL SZ8.5 (GLOVE) ×4 IMPLANT
GLOVE BIOGEL M 8.0 STRL (GLOVE) ×2 IMPLANT
GLOVE BIOGEL PI IND STRL 7.5 (GLOVE) ×3 IMPLANT
GLOVE BIOGEL PI INDICATOR 7.5 (GLOVE) ×3
GLOVE ECLIPSE 7.5 STRL STRAW (GLOVE) ×8 IMPLANT
GLOVE ECLIPSE 8.0 STRL XLNG CF (GLOVE) ×2 IMPLANT
GLOVE EXAM NITRILE LRG STRL (GLOVE) IMPLANT
GLOVE EXAM NITRILE MD LF STRL (GLOVE) IMPLANT
GLOVE EXAM NITRILE XL STR (GLOVE) IMPLANT
GLOVE EXAM NITRILE XS STR PU (GLOVE) IMPLANT
GLOVE INDICATOR 8.5 STRL (GLOVE) ×2 IMPLANT
GLOVE SS BIOGEL STRL SZ 8 (GLOVE) ×2 IMPLANT
GLOVE SUPERSENSE BIOGEL SZ 8 (GLOVE) ×2
GOWN STRL REUS W/ TWL LRG LVL3 (GOWN DISPOSABLE) IMPLANT
GOWN STRL REUS W/ TWL XL LVL3 (GOWN DISPOSABLE) ×8 IMPLANT
GOWN STRL REUS W/TWL 2XL LVL3 (GOWN DISPOSABLE) IMPLANT
GOWN STRL REUS W/TWL LRG LVL3 (GOWN DISPOSABLE)
GOWN STRL REUS W/TWL XL LVL3 (GOWN DISPOSABLE) ×8
KIT BASIN OR (CUSTOM PROCEDURE TRAY) ×2 IMPLANT
KIT ROOM TURNOVER OR (KITS) ×2 IMPLANT
NEEDLE HYPO 21X1.5 SAFETY (NEEDLE) ×2 IMPLANT
NEEDLE HYPO 22GX1.5 SAFETY (NEEDLE) ×2 IMPLANT
NS IRRIG 1000ML POUR BTL (IV SOLUTION) ×2 IMPLANT
PACK LAMINECTOMY NEURO (CUSTOM PROCEDURE TRAY) ×2 IMPLANT
PAD ARMBOARD 7.5X6 YLW CONV (MISCELLANEOUS) ×6 IMPLANT
PATTIES SURGICAL .5 X.5 (GAUZE/BANDAGES/DRESSINGS) ×2 IMPLANT
PATTIES SURGICAL .5 X1 (DISPOSABLE) IMPLANT
ROD CURVED 5.5MMX75MM (Rod) ×4 IMPLANT
SCREW PEDICLE 6.5MMX50MM (Screw) ×4 IMPLANT
SPACER SUSTAIN O 10X10X26 (Screw) ×4 IMPLANT
SPONGE LAP 4X18 X RAY DECT (DISPOSABLE) IMPLANT
SPONGE NEURO XRAY DETECT 1X3 (DISPOSABLE) IMPLANT
SPONGE SURGIFOAM ABS GEL 100 (HEMOSTASIS) ×2 IMPLANT
STRIP BIOACTIVE 10CC 25X100X4 (Miscellaneous) ×2 IMPLANT
STRIP BIOACTIVE 5CC 25X50X4MM (Miscellaneous) ×4 IMPLANT
STRIP CLOSURE SKIN 1/2X4 (GAUZE/BANDAGES/DRESSINGS) ×2 IMPLANT
SUT VIC AB 1 CT1 18XBRD ANBCTR (SUTURE) ×3 IMPLANT
SUT VIC AB 1 CT1 8-18 (SUTURE) ×3
SUT VIC AB 2-0 CP2 18 (SUTURE) ×4 IMPLANT
SYR 20CC LL (SYRINGE) ×2 IMPLANT
SYR 20ML ECCENTRIC (SYRINGE) ×2 IMPLANT
T CONNECTOR ADJ 48MM-61MM (Connector) ×2 IMPLANT
TAPE CLOTH SURG 4X10 WHT LF (GAUZE/BANDAGES/DRESSINGS) ×2 IMPLANT
TOWEL OR 17X24 6PK STRL BLUE (TOWEL DISPOSABLE) ×2 IMPLANT
TOWEL OR 17X26 10 PK STRL BLUE (TOWEL DISPOSABLE) ×2 IMPLANT
TRAY FOLEY CATH 14FRSI W/METER (CATHETERS) ×2 IMPLANT
WATER STERILE IRR 1000ML POUR (IV SOLUTION) ×2 IMPLANT

## 2014-04-04 NOTE — Anesthesia Preprocedure Evaluation (Addendum)
Anesthesia Evaluation  Patient identified by MRN, date of birth, ID band Patient awake    Reviewed: Allergy & Precautions, H&P , NPO status , Patient's Chart, lab work & pertinent test results  History of Anesthesia Complications (+) PONV and history of anesthetic complications  Airway Mallampati: II TM Distance: >3 FB Neck ROM: Full    Dental  (+) Caps, Dental Advisory Given, Poor Dentition Lower front teeth worn down:   Pulmonary Current Smoker, former smoker (quit 30 years),  breath sounds clear to auscultation        Cardiovascular hypertension, Pt. on medications - anginaRhythm:Regular Rate:Normal     Neuro/Psych Anxiety Chronic back pain    GI/Hepatic Neg liver ROS, GERD-  Medicated and Controlled,  Endo/Other  diabetes (glu 118), Oral Hypoglycemic Agents  Renal/GU negative Renal ROS     Musculoskeletal  (+) Arthritis -,   Abdominal (+) + obese,   Peds  Hematology negative hematology ROS (+)   Anesthesia Other Findings   Reproductive/Obstetrics                         Anesthesia Physical Anesthesia Plan  ASA: II  Anesthesia Plan: General   Post-op Pain Management:    Induction: Intravenous  Airway Management Planned: Oral ETT  Additional Equipment:   Intra-op Plan:   Post-operative Plan: Extubation in OR  Informed Consent: I have reviewed the patients History and Physical, chart, labs and discussed the procedure including the risks, benefits and alternatives for the proposed anesthesia with the patient or authorized representative who has indicated his/her understanding and acceptance.   Dental advisory given  Plan Discussed with: CRNA and Surgeon  Anesthesia Plan Comments: (Plan routine monitors, GETA)        Anesthesia Quick Evaluation

## 2014-04-04 NOTE — Anesthesia Postprocedure Evaluation (Signed)
  Anesthesia Post-op Note  Patient: Elaine Roach  Procedure(s) Performed: Procedure(s) with comments: Lumbar one-two posterior lumbar interbody fusion with interbody prosthesis posterior lateral arthrodesis and posterior segmental instrumentation with exploration of fusion (N/A) - Lumbar one-two posterior lumbar interbody fusion with interbody prosthesis posterior lateral arthrodesis and posterior segmental instrumentation with exploration of fusion  Patient Location: PACU  Anesthesia Type:General  Level of Consciousness: awake, alert  and oriented  Airway and Oxygen Therapy: Patient Spontanous Breathing  Post-op Pain: mild  Post-op Assessment: Post-op Vital signs reviewed  Post-op Vital Signs: Reviewed  Last Vitals:  Filed Vitals:   04/04/14 1935  BP:   Pulse: 93  Temp:   Resp: 11    Complications: No apparent anesthesia complications

## 2014-04-04 NOTE — Op Note (Signed)
Brief history: The patient is a 63 year old white female who has had several back surgeries [place]. She has developed recurrent back and leg pain consistent with neurogenic claudication. She has failed medical management and was worked up with a lumbar x-rays, a lumbar MRI, and a lumbar myelo CT. These studies demonstrated the patient had severe stenosis at L1-2. I discussed the various treatment options with the patient including surgery. She has weighed the risks, benefits, and alternatives surgery and decided proceed with a L1-2 decompression, instrumentation, and fusion as well as an exploration of her lumbar fusion..  Preoperative diagnosis: L1-2 Degenerative disc disease, spinal stenosis compressing both the L1 and the L2 nerve roots; lumbago; lumbar radiculopathy,  Postoperative diagnosis: The same and a L2-3 pseudoarthrosis  Procedure: Bilateral L1 Laminotomy/foraminotomies to decompress the bilateral L1 and L2 nerve roots(the work required to do this was in addition to the work required to do the posterior lumbar interbody fusion because of the patient's spinal stenosis, facet arthropathy. Etc. requiring a wide decompression of the nerve roots.); L1 to posterior lumbar interbody fusion with local morselized autograft bone and Kinnex graft extender; insertion of interbody prosthesis at L1 to (globus peek interbody prosthesis); posterior segmental instrumentation from L1 to L3 with globus titanium pedicle screws and rods; posterior lateral arthrodesis at L1-2 and L2-3 with local morselized autograft bone and Kinnex bone graft extender, exploration of lumbar fusion  Surgeon: Dr. Earle Gell  Asst.: Dr. Dominica Severin cram  Anesthesia: Gen. endotracheal  Estimated blood loss: 250 cc  Drains: One medium Hemovac  Complications: None  Description of procedure: The patient was brought to the operating room by the anesthesia team. General endotracheal anesthesia was induced.  The patient was turned to the prone position on the Wilson frame. The patient's lumbosacral region was then prepared with Betadine scrub and Betadine solution. Sterile drapes were applied.  I then injected the area to be incised with Marcaine with epinephrine solution. I then used the scalpel to make a linear midline incision over the L1-2 and L2-3 interspace. I then used electrocautery to perform a bilateral subperiosteal dissection exposing the spinous process and lamina of L1, L2 and the old hardware at L2-3.  We then inserted the Verstrac retractor to provide exposure. I explored the fusion by removing the caps from the screws at L2 and L3. I attempted to move the screws independently. The arthrodesis did not seem solid indicative of an pseudoarthrosis.  I began the decompression by using the high speed drill to perform laminotomies at L1. We then used the Kerrison punches to widen the laminotomy and removed the ligamentum flavum at L1 -2. We used the Kerrison punches to remove the medial facets at L1-2. We performed wide foraminotomies about the bilateral L1 and L2 nerve roots completing the decompression.  We now turned our attention to the posterior lumbar interbody fusion. I used a scalpel to incise the intervertebral disc at L1-2. I then performed a partial intervertebral discectomy at L1-2 using the pituitary forceps. We prepared the vertebral endplates at I6-9 for the fusion by removing the soft tissues with the curettes. We then used the trial spacers to pick the appropriate sized interbody prosthesis. We prefilled his prosthesis with a combination of local morselized autograft bone that we obtained during the decompression as well as Kinnex bone graft extender. We inserted the prefilled prosthesis into the interspace at L1-2. There was a good snug fit of the prosthesis in the interspace. We then filled and  the remainder of the intervertebral disc space with local morselized autograft bone and  Kinnex. This completed the posterior lumbar interbody arthrodesis.  We now turned attention to the instrumentation. Under fluoroscopic guidance we cannulated the bilateral L1 pedicles with the bone probe. We then removed the bone probe. We then tapped the pedicle with a 5.5 millimeter tap. We then removed the tap. We probed inside the tapped pedicle with a ball probe to rule out cortical breaches. We then inserted a 6.5 x 50 millimeter pedicle screw into the L1 pedicles bilaterally under fluoroscopic guidance. We then palpated along the medial aspect of the pedicles to rule out cortical breaches. There were none. The nerve roots were not injured. We then connected the unilateral pedicle screws from L1-L3 with a lordotic rod. We compressed the construct and secured the rod in place with the caps. We then tightened the caps appropriately. He placed a cross connector between the rods .This completed the instrumentation from L1-L3.  We now turned our attention to the posterior lateral arthrodesis at L1-2 and L2-3. We used the high-speed drill to decorticate the remainder of the facets, pars, transverse process at L1-2 and L2-3. We then applied a combination of local morselized autograft bone and Vitoss bone graft extender over these decorticated posterior lateral structures. This completed the posterior lateral arthrodesis.  We then obtained hemostasis using bipolar electrocautery. We irrigated the wound out with bacitracin solution. We inspected the thecal sac and nerve roots and noted they were well decompressed. We then removed the retractor. We placed a medium Hemovac drain in the epidural space and tunneled out through separate stab wound. We reapproximated patient's thoracolumbar fascia with interrupted #1 Vicryl suture. We reapproximated patient's subcutaneous tissue with interrupted 2-0 Vicryl suture. The reapproximated patient's skin with Steri-Strips and benzoin. The wound was then coated with bacitracin  ointment. A sterile dressing was applied. The drapes were removed. The patient was subsequently returned to the supine position where they were extubated by the anesthesia team. He was then transported to the post anesthesia care unit in stable condition. All sponge instrument and needle counts were reportedly correct at the end of this case.

## 2014-04-04 NOTE — Progress Notes (Signed)
Patient ID: Elaine Roach, female   DOB: September 16, 1950, 63 y.o.   MRN: [id number] Subjective:  The patient is somnolent but easily arousable. She looks well. She is in no apparent distress.  Objective: Vital signs in last 24 hours: Temp:  [97.8 F (36.6 C)] 97.8 F (36.6 C) (10/21 1017) Pulse Rate:  [77] 77 (10/21 1017) Resp:  [18] 18 (10/21 1017) BP: (138)/(63) 138/63 mmHg (10/21 1017) SpO2:  [99 %] 99 % (10/21 1017) Weight:  [76.204 kg (168 lb)] 76.204 kg (168 lb) (10/21 1017)  Intake/Output from previous day:   Intake/Output this shift: Total I/O In: 2000 [I.V.:2000] Out: 325 [Urine:125; Blood:200]  Physical exam the patient is somnolent but easily arousable. She is moving her lower extremities well.  Lab Results: No results found for this basename: WBC, HGB, HCT, PLT,  in the last 72 hours BMET No results found for this basename: NA, K, CL, CO2, GLUCOSE, BUN, CREATININE, CALCIUM,  in the last 72 hours  Studies/Results: Dg Lumbar Spine 2-3 Views  04/04/2014   CLINICAL DATA:  63 year old female undergoing lumbar surgery. Initial encounter.  EXAM: DG C-ARM 61-120 MIN;  LUMBAR SPINE 2-3 VIEWS  TECHNIQUE: Two intraoperative fluoroscopic views of the lumbar spine.  CONTRAST:  None.  FLUOROSCOPY TIME:  0 min 30 seconds  COMPARISON:  Lumbar myelogram 03/13/2014.  FINDINGS: Same numbering system used as on 03/13/2014. These images demonstrate transpedicular hardware placement at L1 and interbody implant placement at L1-L2.  IMPRESSION: Cephalad extension of lumbar fusion to L1-L2.   Electronically Signed   By: Lars Pinks M.D.   On: 04/04/2014 18:02   Dg C-arm 1-60 Min  04/04/2014   CLINICAL DATA:  63 year old female undergoing lumbar surgery. Initial encounter.  EXAM: DG C-ARM 61-120 MIN;  LUMBAR SPINE 2-3 VIEWS  TECHNIQUE: Two intraoperative fluoroscopic views of the lumbar spine.  CONTRAST:  None.  FLUOROSCOPY TIME:  0 min 30 seconds  COMPARISON:  Lumbar myelogram 03/13/2014.  FINDINGS:  Same numbering system used as on 03/13/2014. These images demonstrate transpedicular hardware placement at L1 and interbody implant placement at L1-L2.  IMPRESSION: Cephalad extension of lumbar fusion to L1-L2.   Electronically Signed   By: Lars Pinks M.D.   On: 04/04/2014 18:02    Assessment/Plan: The patient is doing well. I spoke with her sister.  LOS: 0 days     Elaine Roach D 04/04/2014, 6:47 PM

## 2014-04-04 NOTE — H&P (Signed)
Subjective: The patient is a 63 year old white female who's had multiple prior back surgeries by [place]. He has had worsening back and leg pain consistent with neurogenic claudication. She has failed medical management was worked up a lumbar x-rays a lumbar MRI. This demonstrated significant spinal stenosis at L1-2. I discussed the various treatment options with the patient including surgery. She has weighed the risks, benefits, and alternatives surgery and decided proceed with an L1/L2 decompression, instrumentation, and fusion with an exploration of her old fusion .  Past Medical History  Diagnosis Date  . Complication of anesthesia   . PONV (postoperative nausea and vomiting)   . GERD (gastroesophageal reflux disease)   . Diabetes mellitus without complication     type 2    dx 10 yrs ago  . Nonalcoholic steatohepatitis (NASH)     DX IN 1995  . Depression   . History of OCD (obsessive compulsive disorder)     TO A DEGREE  . Hypertension   . Dysrhythmia     BENIGN PVC'S  . Arthritis   . Osteoarthritis     Past Surgical History  Procedure Laterality Date  . Shoulder arthroscopy w/ rotator cuff repair      2 SCOPES.Marland KitchenMarland KitchenALL ON THE RIGHT  . Ganglion cyst excision    . Carpal tunnel release      RIGHT  . Cholecystectomy    . Tubal ligation    . Bunionectomy      RIGHT  . Back surgery      No Known Allergies  History  Substance Use Topics  . Smoking status: Heavy Tobacco Smoker -- 2.00 packs/day for 17 years    Types: Cigarettes  . Smokeless tobacco: Never Used     Comment: quit when she was 13  . Alcohol Use: Yes     Comment: OCCASIONAL  WINE    No family history on file. Prior to Admission medications   Medication Sig Start Date End Date Taking? Authorizing Provider  lisinopril-hydrochlorothiazide (PRINZIDE,ZESTORETIC) 20-12.5 MG per tablet Take 1 tablet by mouth daily.   Yes Historical Provider, MD  metFORMIN (GLUCOPHAGE) 500 MG tablet Take  500 mg by mouth 2 (two) times daily with a meal.   Yes Historical Provider, MD  Multiple Vitamin (MULTIVITAMIN WITH MINERALS) TABS tablet Take 1 tablet by mouth daily.   Yes Historical Provider, MD  omeprazole (PRILOSEC) 40 MG capsule Take 40 mg by mouth daily.   Yes Historical Provider, MD  pravastatin (PRAVACHOL) 20 MG tablet Take 20 mg by mouth at bedtime.   Yes Historical Provider, MD  sertraline (ZOLOFT) 100 MG tablet Take 100 mg by mouth daily.   Yes Historical Provider, MD     Review of Systems  Positive ROS: As above  All other systems have been reviewed and were otherwise negative with the exception of those mentioned in the HPI and as above.  Objective: Vital signs in last 24 hours: Temp:  [97.8 F (36.6 C)] 97.8 F (36.6 C) (10/21 1017) Pulse Rate:  [77] 77 (10/21 1017) Resp:  [18] 18 (10/21 1017) BP: (138)/(63) 138/63 mmHg (10/21 1017) SpO2:  [99 %] 99 % (10/21 1017) Weight:  [76.204 kg (168 lb)] 76.204 kg (168 lb) (10/21 1017)  General Appearance: Alert, cooperative, no distress, Head: Normocephalic, without obvious abnormality, atraumatic Eyes: PERRL, conjunctiva/corneas clear, EOM's intact,    Ears: Normal  Throat: Normal  Neck: Supple, symmetrical, trachea midline, no adenopathy; thyroid: No enlargement/tenderness/nodules; no carotid bruit or JVD  Back: Symmetric, no curvature, ROM normal, no CVA tenderness. The patient's lumbar incision is well-healed Lungs: Clear to auscultation bilaterally, respirations unlabored Heart: Regular rate and rhythm, no murmur, rub or gallop Abdomen: Soft, non-tender,, no masses, no organomegaly Extremities: Extremities normal, atraumatic, no cyanosis or edema Pulses: 2+ and symmetric all extremities Skin: Skin color, texture, turgor normal, no rashes or lesions  NEUROLOGIC:   Mental status: alert and oriented, no aphasia, good attention span, Fund of knowledge/ memory ok Motor Exam - grossly normal Sensory Exam - grossly  normal Reflexes:  Coordination - grossly normal Gait - grossly normal Balance - grossly normal Cranial Nerves: I: smell Not tested  II: visual acuity  OS: Normal  OD: Normal   II: visual fields Full to confrontation  II: pupils Equal, round, reactive to light  III,VII: ptosis None  III,IV,VI: extraocular muscles  Full ROM  V: mastication Normal  V: facial light touch sensation  Normal  V,VII: corneal reflex  Present  VII: facial muscle function - upper  Normal  VII: facial muscle function - lower Normal  VIII: hearing Not tested  IX: soft palate elevation  Normal  IX,X: gag reflex Present  XI: trapezius strength  5/5  XI: sternocleidomastoid strength 5/5  XI: neck flexion strength  5/5  XII: tongue strength  Normal    Data Review Lab Results  Component Value Date   WBC 5.5 03/30/2014   HGB 13.3 03/30/2014   HCT 40.7 03/30/2014   MCV 86.0 03/30/2014   PLT 277 03/30/2014   Lab Results  Component Value Date   NA 138 03/30/2014   K 4.3 03/30/2014   CL 100 03/30/2014   CO2 23 03/30/2014   BUN 22 03/30/2014   CREATININE 0.81 03/30/2014   GLUCOSE 109* 03/30/2014   No results found for this basename: INR, PROTIME    Assessment/Plan: L1-2 disc degeneration, spinal stenosis, lumbago, lumbar radiculopathy, neurogenic claudication: I have discussed situation with the patient. I have reviewed her imaging studies with her and pointed out the abnormalities. We have discussed the various treatment options including an exploration of her lumbar fusion with an L1-2 decompression, instrumentation, and fusion. I have described the surgery to her. I have shown her surgical models. We have discussed the risks, benefits, alternatives, and likelihood of achieving our goals with surgery. I have answered all the patient's questions. She has decided proceed with surgery.   Elaine Roach D 04/04/2014 2:30 PM

## 2014-04-04 NOTE — Transfer of Care (Signed)
Immediate Anesthesia Transfer of Care Note  Patient: Asiana Benninger  Procedure(s) Performed: Procedure(s) with comments: Lumbar one-two posterior lumbar interbody fusion with interbody prosthesis posterior lateral arthrodesis and posterior segmental instrumentation with exploration of fusion (N/A) - Lumbar one-two posterior lumbar interbody fusion with interbody prosthesis posterior lateral arthrodesis and posterior segmental instrumentation with exploration of fusion  Patient Location: PACU  Anesthesia Type:General  Level of Consciousness: sedated  Airway & Oxygen Therapy: Patient Spontanous Breathing and Patient connected to face mask oxygen  Post-op Assessment: Report given to PACU RN and Post -op Vital signs reviewed and stable  Post vital signs: Reviewed and stable  Complications: No apparent anesthesia complications

## 2014-04-05 LAB — CBC
HCT: 30.9 % — ABNORMAL LOW (ref 36.0–46.0)
Hemoglobin: 10.1 g/dL — ABNORMAL LOW (ref 12.0–15.0)
MCH: 27.5 pg (ref 26.0–34.0)
MCHC: 32.7 g/dL (ref 30.0–36.0)
MCV: 84.2 fL (ref 78.0–100.0)
Platelets: 238 10*3/uL (ref 150–400)
RBC: 3.67 MIL/uL — ABNORMAL LOW (ref 3.87–5.11)
RDW: 14.1 % (ref 11.5–15.5)
WBC: 7.3 10*3/uL (ref 4.0–10.5)

## 2014-04-05 LAB — GLUCOSE, CAPILLARY
Glucose-Capillary: 107 mg/dL — ABNORMAL HIGH (ref 70–99)
Glucose-Capillary: 94 mg/dL (ref 70–99)

## 2014-04-05 LAB — BASIC METABOLIC PANEL
Anion gap: 12 (ref 5–15)
BUN: 16 mg/dL (ref 6–23)
CO2: 26 mEq/L (ref 19–32)
Calcium: 9.2 mg/dL (ref 8.4–10.5)
Chloride: 103 mEq/L (ref 96–112)
Creatinine, Ser: 0.9 mg/dL (ref 0.50–1.10)
GFR calc Af Amer: 77 mL/min — ABNORMAL LOW (ref >90)
GFR calc non Af Amer: 67 mL/min — ABNORMAL LOW (ref >90)
Glucose, Bld: 130 mg/dL — ABNORMAL HIGH (ref 70–99)
Potassium: 5.4 mEq/L — ABNORMAL HIGH (ref 3.7–5.3)
Sodium: 141 mEq/L (ref 137–147)

## 2014-04-05 MED ORDER — DIAZEPAM 5 MG PO TABS: 5.0000 mg | ORAL_TABLET | Freq: Four times a day (QID) | ORAL | Status: DC | PRN

## 2014-04-05 MED ORDER — DSS 100 MG PO CAPS: 100.0000 mg | ORAL_CAPSULE | Freq: Two times a day (BID) | ORAL | Status: DC

## 2014-04-05 MED ORDER — OXYCODONE-ACETAMINOPHEN 10-325 MG PO TABS: 1.0000 | ORAL_TABLET | ORAL | Status: DC | PRN

## 2014-04-05 MED FILL — Heparin Sodium (Porcine) Inj 1000 Unit/ML: INTRAMUSCULAR | Qty: 30 | Status: AC

## 2014-04-05 MED FILL — Sodium Chloride IV Soln 0.9%: INTRAVENOUS | Qty: 1000 | Status: AC

## 2014-04-05 NOTE — Progress Notes (Signed)
Patient alert and oriented, mae's well, voiding adequate amount of urine, swallowing without difficulty, no c/o pain. Patient discharged home with family. Script and discharged instructions given to patient. Patient and family stated understanding of d/c instructions given and has an appointment with MD. Aisha Roberto Hlavaty RN 

## 2014-04-05 NOTE — Evaluation (Signed)
Physical Therapy Evaluation and Discharge Patient Details Name: Elaine Roach MRN: [id number] DOB: 1951/01/13 Today's Date: 04/05/2014   History of Present Illness  63 y.o. female s/p Lumbar one-two posterior lumbar interbody fusion  Clinical Impression  Patient evaluated by Physical Therapy with no further acute PT needs identified. All education has been completed and the patient has no further questions. Ambulates up to 290 feet without an assistive device; due to history of falls prior to surgery I have recommended use of cane temporarily for increased stability and pt agrees. Safely completed stair training. Pt reports sisters plan to check in on her several times per day at d/c. See below for any follow-up Physial Therapy or equipment needs. PT is signing off. Thank you for this referral.     Follow Up Recommendations No PT follow up;Supervision - Intermittent    Equipment Recommendations  Cane    Recommendations for Other Services       Precautions / Restrictions Precautions Precautions: Back Precaution Booklet Issued: Yes (comment) Precaution Comments: Reviewed back precautions with patient Required Braces or Orthoses: Spinal Brace Spinal Brace: Lumbar corset Restrictions Weight Bearing Restrictions: No      Mobility  Bed Mobility Overal bed mobility: Modified Independent                Transfers Overall transfer level: Modified independent Equipment used: None             General transfer comment: Supervision for safety. No loss of balance or physical assist required. Performed from lowest bed setting.  Ambulation/Gait Ambulation/Gait assistance: Supervision Ambulation Distance (Feet): 290 Feet Assistive device: None Gait Pattern/deviations: Step-through pattern;Decreased stride length;Wide base of support   Gait velocity interpretation: Below normal speed for age/gender General Gait Details: Supervision for safety. No instances of buckling (Hx  of falls due to buckling). Pt reports great improvement in LE symptoms. No loss of balance noted during bout  Stairs Stairs: Yes Stairs assistance: Supervision Stair Management: Step to pattern;Forwards;One rail Left Number of Stairs: 13 General stair comments: VC for sequencing and use of rail similar to home environment. Safely completed this task and reports she has no further questions.  Wheelchair Mobility    Modified Rankin (Stroke Patients Only)       Balance Overall balance assessment: History of Falls;Modified Independent                                           Pertinent Vitals/Pain Pain Assessment: 0-10 Pain Score: 4  Pain Location: back Pain Descriptors / Indicators:  ("surgical") Pain Intervention(s): Monitored during session;Repositioned    Home Living Family/patient expects to be discharged to:: Private residence Living Arrangements: Alone Available Help at Discharge: Available PRN/intermittently;Family (sisters) Type of Home: House Home Access: Stairs to enter Entrance Stairs-Rails: Left Entrance Stairs-Number of Steps: 3 Home Layout: One level Home Equipment: None      Prior Function Level of Independence: Independent               Hand Dominance        Extremity/Trunk Assessment   Upper Extremity Assessment: Defer to OT evaluation           Lower Extremity Assessment: Overall WFL for tasks assessed         Communication   Communication: No difficulties  Cognition Arousal/Alertness: Awake/alert Behavior During Therapy: WFL for tasks assessed/performed Overall Cognitive Status: Within  Functional Limits for tasks assessed                      General Comments General comments (skin integrity, edema, etc.): Reviewed back precautions. Pt reports she has been familiar with these from previous surgeries. Discussed safety with mobility. Practiced donning/doffing corset.    Exercises         Assessment/Plan    PT Assessment Patent does not need any further PT services  PT Diagnosis Abnormality of gait;Acute pain   PT Problem List    PT Treatment Interventions     PT Goals (Current goals can be found in the Care Plan section) Acute Rehab PT Goals Patient Stated Goal: Go home PT Goal Formulation: All assessment and education complete, DC therapy    Frequency     Barriers to discharge        Co-evaluation               End of Session Equipment Utilized During Treatment: Gait belt;Back brace Activity Tolerance: Patient tolerated treatment well Patient left: in chair;with call bell/phone within reach Nurse Communication: Mobility status         Time: 9242-6834 PT Time Calculation (min): 12 min   Charges:   PT Evaluation $Initial PT Evaluation Tier I: 1 Procedure     PT G Codes:        Elaine Roach, Elaine Roach   Elaine Roach 04/05/2014, 10:18 AM

## 2014-04-05 NOTE — Progress Notes (Signed)
Utilization review completed.  

## 2014-04-05 NOTE — Progress Notes (Signed)
[facility] Progress Note Patient Details:  Elaine Roach 11-22-1950 [id number] Confirmed order with [employer]. Patient ID: Elaine Roach, female   DOB: 1950/09/15, 63 y.o.   MRN: [id number]   Elaine Roach 04/05/2014, 9:28 AM

## 2014-04-05 NOTE — Discharge Summary (Signed)
Physician Discharge Summary  Patient ID: Elaine Roach MRN: [id number] DOB/AGE: 12-08-1950 63 y.o.  Admit date: 04/04/2014 Discharge date: 04/05/2014  Admission Diagnoses: L1-2 disc degeneration, spinal stenosis, lumbago, lumbar pseudarthrosis  Discharge Diagnoses: The same Active Problems:   Lumbar stenosis with neurogenic claudication   Discharged Condition: good  Hospital Course: I performed an exploration the patient's lumbar fusion with an L1-2 and L2-3 decompression, instrumentation, and fusion on the patient on 04/04/2014. The surgery went well.  The patient's postoperative course was unremarkable. On postop day #1 the patient requested discharge to home. The patient was given oral and written discharge instructions. All her questions were answered.  Consults: None Significant Diagnostic Studies: None Treatments: Exploration of lumbar fusion, L1-2 and L2-3 decompression, instrumentation, and fusion Discharge Exam: Blood pressure 103/59, pulse 69, temperature 98.9 F (37.2 C), temperature source Oral, resp. rate 20, weight 76.204 kg (168 lb), SpO2 98.00%. The patient is alert and pleasant. She looks well. Her dressing is clean and dry. Her strength is normal.  Disposition: Home  Discharge Instructions   Call MD for:  difficulty breathing, headache or visual disturbances    Complete by:  As directed      Call MD for:  extreme fatigue    Complete by:  As directed      Call MD for:  hives    Complete by:  As directed      Call MD for:  persistant dizziness or light-headedness    Complete by:  As directed      Call MD for:  persistant nausea and vomiting    Complete by:  As directed      Call MD for:  redness, tenderness, or signs of infection (pain, swelling, redness, odor or green/yellow discharge around incision site)    Complete by:  As directed      Call MD for:  severe uncontrolled pain    Complete by:  As directed      Call MD for:  temperature >100.4     Complete by:  As directed      Diet - low sodium heart healthy    Complete by:  As directed      Discharge instructions    Complete by:  As directed   Call [phone] for a followup appointment. Take a stool softener while you are using pain medications.     Driving Restrictions    Complete by:  As directed   Do not drive for 2 weeks.     Increase activity slowly    Complete by:  As directed      Lifting restrictions    Complete by:  As directed   Do not lift more than 5 pounds. No excessive bending or twisting.     May shower / Bathe    Complete by:  As directed   He may shower after the pain she is removed 3 days after surgery. Leave the incision alone.     Remove dressing in 48 hours    Complete by:  As directed   Your stitches are under the scan and will dissolve by themselves. The Steri-Strips will fall off after you take a few showers. Do not rub back or pick at the wound, Leave the wound alone.            Medication List         diazepam 5 MG tablet  Commonly known as:  VALIUM  Take 1 tablet (5 mg total) by mouth every  6 (six) hours as needed for muscle spasms.     DSS 100 MG Caps  Take 100 mg by mouth 2 (two) times daily.     lisinopril-hydrochlorothiazide 20-12.5 MG per tablet  Commonly known as:  PRINZIDE,ZESTORETIC  Take 1 tablet by mouth daily.     metFORMIN 500 MG tablet  Commonly known as:  GLUCOPHAGE  Take 500 mg by mouth 2 (two) times daily with a meal.     multivitamin with minerals Tabs tablet  Take 1 tablet by mouth daily.     omeprazole 40 MG capsule  Commonly known as:  PRILOSEC  Take 40 mg by mouth daily.     oxyCODONE-acetaminophen 10-325 MG per tablet  Commonly known as:  PERCOCET  Take 1 tablet by mouth every 4 (four) hours as needed for pain.     pravastatin 20 MG tablet  Commonly known as:  PRAVACHOL  Take 20 mg by mouth at bedtime.     sertraline 100 MG tablet  Commonly known as:  ZOLOFT  Take 100 mg by mouth daily.          SignedNewman Pies D 04/05/2014, 3:00 PM

## 2015-01-09 ENCOUNTER — Other Ambulatory Visit: Payer: Self-pay | Admitting: Neurosurgery

## 2015-01-09 DIAGNOSIS — M545 Low back pain, unspecified: Secondary | ICD-10-CM

## 2015-01-09 DIAGNOSIS — G8929 Other chronic pain: Secondary | ICD-10-CM

## 2015-01-09 DIAGNOSIS — M542 Cervicalgia: Secondary | ICD-10-CM

## 2015-01-14 ENCOUNTER — Ambulatory Visit
Admission: RE | Admit: 2015-01-14 | Discharge: 2015-01-14 | Disposition: A | Discharge status: Home / Self Care | Payer: 59 | Source: Ambulatory Visit | Attending: Neurosurgery | Admitting: Neurosurgery

## 2015-01-14 ENCOUNTER — Ambulatory Visit
Admission: RE | Admit: 2015-01-14 | Discharge: 2015-01-14 | Disposition: A | Discharge status: Home / Self Care | Payer: Self-pay | Source: Ambulatory Visit | Attending: Neurosurgery | Admitting: Neurosurgery

## 2015-01-14 VITALS — BP 112/54 | HR 61

## 2015-01-14 DIAGNOSIS — M545 Low back pain, unspecified: Secondary | ICD-10-CM

## 2015-01-14 DIAGNOSIS — M48062 Spinal stenosis, lumbar region with neurogenic claudication: Secondary | ICD-10-CM

## 2015-01-14 DIAGNOSIS — M542 Cervicalgia: Secondary | ICD-10-CM

## 2015-01-14 DIAGNOSIS — G8929 Other chronic pain: Secondary | ICD-10-CM

## 2015-01-14 MED ORDER — IOHEXOL 300 MG/ML  SOLN
10.0000 mL | Freq: Once | INTRAMUSCULAR | Status: AC | PRN
  Administered 2015-01-14: 10 mL via INTRATHECAL

## 2015-01-14 MED ORDER — DIAZEPAM 5 MG PO TABS
10.0000 mg | ORAL_TABLET | Freq: Once | ORAL | Status: AC
  Administered 2015-01-14: 10 mg via ORAL

## 2015-01-14 NOTE — Progress Notes (Signed)
Pt states she has been off Sertraline for the past 2 days.

## 2015-01-14 NOTE — Discharge Instructions (Signed)
Myelogram Discharge Instructions  1. Go home and rest quietly for the next 24 hours.  It is important to lie flat for the next 24 hours.  Get up only to go to the restroom.  You may lie in the bed or on a couch on your back, your stomach, your left side or your right side.  You may have one pillow under your head.  You may have pillows between your knees while you are on your side or under your knees while you are on your back.  2. DO NOT drive today.  Recline the seat as far back as it will go, while still wearing your seat belt, on the way home.  3. You may get up to go to the bathroom as needed.  You may sit up for 10 minutes to eat.  You may resume your normal diet and medications unless otherwise indicated.  Drink lots of extra fluids today and tomorrow.  4. The incidence of headache, nausea, or vomiting is about 5% (one in 20 patients).  If you develop a headache, lie flat and drink plenty of fluids until the headache goes away.  Caffeinated beverages may be helpful.  If you develop severe nausea and vomiting or a headache that does not go away with flat bed rest, call [phone].  5. You may resume normal activities after your 24 hours of bed rest is over; however, do not exert yourself strongly or do any heavy lifting tomorrow. If when you get up you have a headache when standing, go back to bed and force fluids for another 24 hours.  6. Call your physician for a follow-up appointment.  The results of your myelogram will be sent directly to your physician by the following day.  7. If you have any questions or if complications develop after you arrive home, please call [phone].  Discharge instructions have been explained to the patient.  The patient, or the person responsible for the patient, fully understands these instructions.        May resume Sertraline on Aug. 2, 2016, after 11:00 am.

## 2015-02-05 ENCOUNTER — Other Ambulatory Visit: Payer: Self-pay

## 2015-02-05 DIAGNOSIS — Z1231 Encounter for screening mammogram for malignant neoplasm of breast: Secondary | ICD-10-CM

## 2015-03-05 ENCOUNTER — Ambulatory Visit
Admission: RE | Admit: 2015-03-05 | Discharge: 2015-03-05 | Disposition: A | Discharge status: Home / Self Care | Payer: 59 | Source: Ambulatory Visit

## 2015-03-05 DIAGNOSIS — Z1231 Encounter for screening mammogram for malignant neoplasm of breast: Secondary | ICD-10-CM

## 2015-03-19 ENCOUNTER — Encounter (HOSPITAL_COMMUNITY)
Admission: AD | Disposition: A | Discharge status: Home / Self Care | Payer: Self-pay | Source: Ambulatory Visit | Attending: Neurosurgery

## 2015-03-19 ENCOUNTER — Other Ambulatory Visit: Payer: Self-pay | Admitting: Neurosurgery

## 2015-03-19 ENCOUNTER — Inpatient Hospital Stay (HOSPITAL_COMMUNITY): Payer: 59 | Admitting: Anesthesiology

## 2015-03-19 ENCOUNTER — Encounter (HOSPITAL_COMMUNITY): Payer: Self-pay | Admitting: Certified Registered"

## 2015-03-19 ENCOUNTER — Inpatient Hospital Stay (HOSPITAL_COMMUNITY)
Admission: AD | Admit: 2015-03-19 | Discharge: 2015-03-21 | DRG: 909 | Disposition: A | Discharge status: Home / Self Care | Payer: 59 | Source: Ambulatory Visit | Attending: Neurosurgery | Admitting: Neurosurgery

## 2015-03-19 DIAGNOSIS — E119 Type 2 diabetes mellitus without complications: Secondary | ICD-10-CM | POA: Diagnosis present

## 2015-03-19 DIAGNOSIS — R1319 Other dysphagia: Secondary | ICD-10-CM | POA: Diagnosis present

## 2015-03-19 DIAGNOSIS — I10 Essential (primary) hypertension: Secondary | ICD-10-CM | POA: Diagnosis present

## 2015-03-19 DIAGNOSIS — Z7984 Long term (current) use of oral hypoglycemic drugs: Secondary | ICD-10-CM

## 2015-03-19 DIAGNOSIS — K219 Gastro-esophageal reflux disease without esophagitis: Secondary | ICD-10-CM | POA: Diagnosis present

## 2015-03-19 DIAGNOSIS — F17200 Nicotine dependence, unspecified, uncomplicated: Secondary | ICD-10-CM | POA: Diagnosis present

## 2015-03-19 DIAGNOSIS — M96840 Postprocedural hematoma of a musculoskeletal structure following a musculoskeletal system procedure: Principal | ICD-10-CM | POA: Diagnosis present

## 2015-03-19 DIAGNOSIS — M1990 Unspecified osteoarthritis, unspecified site: Secondary | ICD-10-CM | POA: Diagnosis present

## 2015-03-19 DIAGNOSIS — K7581 Nonalcoholic steatohepatitis (NASH): Secondary | ICD-10-CM | POA: Diagnosis present

## 2015-03-19 DIAGNOSIS — Y838 Other surgical procedures as the cause of abnormal reaction of the patient, or of later complication, without mention of misadventure at the time of the procedure: Secondary | ICD-10-CM | POA: Diagnosis present

## 2015-03-19 DIAGNOSIS — Z79899 Other long term (current) drug therapy: Secondary | ICD-10-CM | POA: Diagnosis not present

## 2015-03-19 DIAGNOSIS — F329 Major depressive disorder, single episode, unspecified: Secondary | ICD-10-CM | POA: Diagnosis present

## 2015-03-19 DIAGNOSIS — R1310 Dysphagia, unspecified: Secondary | ICD-10-CM

## 2015-03-19 HISTORY — PX: WOUND EXPLORATION: SHX2669

## 2015-03-19 HISTORY — PX: WOUND EXPLORATION: SHX6188

## 2015-03-19 LAB — GLUCOSE, CAPILLARY: Glucose-Capillary: 94 mg/dL (ref 65–99)

## 2015-03-19 SURGERY — WOUND EXPLORATION
Anesthesia: General | Site: Neck

## 2015-03-19 MED ORDER — PROPOFOL 10 MG/ML IV BOLUS
INTRAVENOUS | Status: DC | PRN
  Administered 2015-03-19: 100 mg via INTRAVENOUS

## 2015-03-19 MED ORDER — DEXAMETHASONE SODIUM PHOSPHATE 10 MG/ML IJ SOLN
INTRAMUSCULAR | Status: AC
  Filled 2015-03-19: qty 1

## 2015-03-19 MED ORDER — THROMBIN 5000 UNITS EX SOLR
CUTANEOUS | Status: DC | PRN
  Administered 2015-03-19 (×2): 5000 [IU] via TOPICAL

## 2015-03-19 MED ORDER — MORPHINE SULFATE (PF) 2 MG/ML IV SOLN
1.0000 mg | INTRAVENOUS | Status: DC | PRN
  Administered 2015-03-19 – 2015-03-21 (×3): 2 mg via INTRAVENOUS
  Filled 2015-03-19 (×3): qty 1

## 2015-03-19 MED ORDER — LACTATED RINGERS IV SOLN
INTRAVENOUS | Status: DC | PRN
  Administered 2015-03-19: 20:00:00 via INTRAVENOUS

## 2015-03-19 MED ORDER — DIAZEPAM 5 MG PO TABS: 5.0000 mg | ORAL_TABLET | Freq: Four times a day (QID) | ORAL | Status: DC | PRN

## 2015-03-19 MED ORDER — SODIUM CHLORIDE 0.9 % IR SOLN
Status: DC | PRN
  Administered 2015-03-19: 500 mL

## 2015-03-19 MED ORDER — PHENOL 1.4 % MT LIQD: 1.0000 | OROMUCOSAL | Status: DC | PRN

## 2015-03-19 MED ORDER — DIAZEPAM 5 MG/ML IJ SOLN: 5.0000 mg | Freq: Four times a day (QID) | INTRAMUSCULAR | Status: DC | PRN

## 2015-03-19 MED ORDER — PRAVASTATIN SODIUM 20 MG PO TABS: 20.0000 mg | ORAL_TABLET | Freq: Every day | ORAL | Status: DC

## 2015-03-19 MED ORDER — ACETAMINOPHEN 650 MG RE SUPP: 650.0000 mg | RECTAL | Status: DC | PRN

## 2015-03-19 MED ORDER — ONDANSETRON HCL 4 MG/2ML IJ SOLN: 4.0000 mg | Freq: Four times a day (QID) | INTRAMUSCULAR | Status: DC | PRN

## 2015-03-19 MED ORDER — CEFAZOLIN SODIUM-DEXTROSE 2-3 GM-% IV SOLR
INTRAVENOUS | Status: DC | PRN
  Administered 2015-03-19: 2 g via INTRAVENOUS

## 2015-03-19 MED ORDER — DEXAMETHASONE SODIUM PHOSPHATE 4 MG/ML IJ SOLN
INTRAMUSCULAR | Status: DC | PRN
  Administered 2015-03-19: 10 mg via INTRAVENOUS

## 2015-03-19 MED ORDER — LIDOCAINE HCL (CARDIAC) 20 MG/ML IV SOLN
INTRAVENOUS | Status: DC | PRN
  Administered 2015-03-19: 100 mg via INTRAVENOUS

## 2015-03-19 MED ORDER — LIDOCAINE HCL (CARDIAC) 20 MG/ML IV SOLN
INTRAVENOUS | Status: AC
  Filled 2015-03-19: qty 5

## 2015-03-19 MED ORDER — LISINOPRIL 10 MG PO TABS: 10.0000 mg | ORAL_TABLET | Freq: Every day | ORAL | Status: DC

## 2015-03-19 MED ORDER — SERTRALINE HCL 100 MG PO TABS
100.0000 mg | ORAL_TABLET | Freq: Every day | ORAL | Status: DC
  Administered 2015-03-20 – 2015-03-21 (×2): 100 mg via ORAL
  Filled 2015-03-19 (×2): qty 1

## 2015-03-19 MED ORDER — SODIUM CHLORIDE 0.9 % IJ SOLN
INTRAMUSCULAR | Status: AC
  Filled 2015-03-19: qty 10

## 2015-03-19 MED ORDER — THROMBIN 5000 UNITS EX SOLR
OROMUCOSAL | Status: DC | PRN
  Administered 2015-03-19: 5 mL via TOPICAL

## 2015-03-19 MED ORDER — SUFENTANIL CITRATE 50 MCG/ML IV SOLN
INTRAVENOUS | Status: DC | PRN
  Administered 2015-03-19: 15 ug via INTRAVENOUS
  Administered 2015-03-19: 5 ug via INTRAVENOUS
  Administered 2015-03-19: 20 ug via INTRAVENOUS
  Administered 2015-03-19: 10 ug via INTRAVENOUS

## 2015-03-19 MED ORDER — METFORMIN HCL 500 MG PO TABS
500.0000 mg | ORAL_TABLET | Freq: Every day | ORAL | Status: DC
  Administered 2015-03-20: 500 mg via ORAL
  Filled 2015-03-19: qty 1

## 2015-03-19 MED ORDER — DEXAMETHASONE 4 MG PO TABS
4.0000 mg | ORAL_TABLET | Freq: Four times a day (QID) | ORAL | Status: AC
  Administered 2015-03-19 – 2015-03-20 (×2): 4 mg via ORAL
  Filled 2015-03-19 (×2): qty 1

## 2015-03-19 MED ORDER — CEFAZOLIN SODIUM-DEXTROSE 2-3 GM-% IV SOLR
2.0000 g | Freq: Three times a day (TID) | INTRAVENOUS | Status: AC
  Administered 2015-03-20 (×2): 2 g via INTRAVENOUS
  Filled 2015-03-19 (×2): qty 50

## 2015-03-19 MED ORDER — MIDAZOLAM HCL 5 MG/5ML IJ SOLN
INTRAMUSCULAR | Status: DC | PRN
  Administered 2015-03-19: 2 mg via INTRAVENOUS

## 2015-03-19 MED ORDER — DEXAMETHASONE SODIUM PHOSPHATE 4 MG/ML IJ SOLN
4.0000 mg | Freq: Four times a day (QID) | INTRAMUSCULAR | Status: AC
  Administered 2015-03-20: 4 mg via INTRAVENOUS
  Filled 2015-03-19: qty 1

## 2015-03-19 MED ORDER — OXYCODONE-ACETAMINOPHEN 5-325 MG PO TABS: 1.0000 | ORAL_TABLET | ORAL | Status: DC | PRN

## 2015-03-19 MED ORDER — MENTHOL 3 MG MT LOZG: 1.0000 | LOZENGE | OROMUCOSAL | Status: DC | PRN

## 2015-03-19 MED ORDER — HEMOSTATIC AGENTS (NO CHARGE) OPTIME
TOPICAL | Status: DC | PRN
  Administered 2015-03-19: 1 via TOPICAL

## 2015-03-19 MED ORDER — CEFAZOLIN SODIUM-DEXTROSE 2-3 GM-% IV SOLR
INTRAVENOUS | Status: AC
  Filled 2015-03-19: qty 50

## 2015-03-19 MED ORDER — ALUM & MAG HYDROXIDE-SIMETH 200-200-20 MG/5ML PO SUSP: 30.0000 mL | Freq: Four times a day (QID) | ORAL | Status: DC | PRN

## 2015-03-19 MED ORDER — PANTOPRAZOLE SODIUM 40 MG IV SOLR
40.0000 mg | Freq: Two times a day (BID) | INTRAVENOUS | Status: DC
  Administered 2015-03-19 – 2015-03-21 (×3): 40 mg via INTRAVENOUS
  Filled 2015-03-19 (×3): qty 40

## 2015-03-19 MED ORDER — SCOPOLAMINE 1 MG/3DAYS TD PT72
MEDICATED_PATCH | TRANSDERMAL | Status: DC | PRN
  Administered 2015-03-19: 1 via TRANSDERMAL

## 2015-03-19 MED ORDER — SUFENTANIL CITRATE 50 MCG/ML IV SOLN
INTRAVENOUS | Status: AC
  Filled 2015-03-19: qty 1

## 2015-03-19 MED ORDER — MORPHINE SULFATE (PF) 2 MG/ML IV SOLN
2.0000 mg | INTRAVENOUS | Status: DC | PRN
  Administered 2015-03-19: 2 mg via INTRAVENOUS
  Filled 2015-03-19: qty 1

## 2015-03-19 MED ORDER — HYDROCODONE-ACETAMINOPHEN 5-325 MG PO TABS
1.0000 | ORAL_TABLET | ORAL | Status: DC | PRN
  Administered 2015-03-20: 2 via ORAL
  Filled 2015-03-19: qty 1
  Filled 2015-03-19: qty 2

## 2015-03-19 MED ORDER — SUCCINYLCHOLINE CHLORIDE 20 MG/ML IJ SOLN
INTRAMUSCULAR | Status: DC | PRN
  Administered 2015-03-19: 100 mg via INTRAVENOUS

## 2015-03-19 MED ORDER — LACTATED RINGERS IV SOLN
INTRAVENOUS | Status: DC
  Administered 2015-03-19 – 2015-03-20 (×2): via INTRAVENOUS

## 2015-03-19 MED ORDER — ROCURONIUM BROMIDE 50 MG/5ML IV SOLN
INTRAVENOUS | Status: AC
  Filled 2015-03-19: qty 1

## 2015-03-19 MED ORDER — SUCCINYLCHOLINE CHLORIDE 20 MG/ML IJ SOLN
INTRAMUSCULAR | Status: AC
  Filled 2015-03-19: qty 1

## 2015-03-19 MED ORDER — LISINOPRIL-HYDROCHLOROTHIAZIDE 20-12.5 MG PO TABS: 1.0000 | ORAL_TABLET | Freq: Every day | ORAL | Status: DC

## 2015-03-19 MED ORDER — ONDANSETRON HCL 4 MG/2ML IJ SOLN: 4.0000 mg | INTRAMUSCULAR | Status: DC | PRN

## 2015-03-19 MED ORDER — PROPOFOL 10 MG/ML IV BOLUS
INTRAVENOUS | Status: AC
  Filled 2015-03-19: qty 20

## 2015-03-19 MED ORDER — ACETAMINOPHEN 325 MG PO TABS: 650.0000 mg | ORAL_TABLET | ORAL | Status: DC | PRN

## 2015-03-19 MED ORDER — LACTATED RINGERS IV SOLN
INTRAVENOUS | Status: DC
  Administered 2015-03-19: 19:00:00 via INTRAVENOUS

## 2015-03-19 MED ORDER — MIDAZOLAM HCL 2 MG/2ML IJ SOLN
INTRAMUSCULAR | Status: AC
  Filled 2015-03-19: qty 4

## 2015-03-19 MED ORDER — HYDROMORPHONE HCL 1 MG/ML IJ SOLN: 0.2500 mg | INTRAMUSCULAR | Status: DC | PRN

## 2015-03-19 MED ORDER — 0.9 % SODIUM CHLORIDE (POUR BTL) OPTIME
TOPICAL | Status: DC | PRN
  Administered 2015-03-19: 1000 mL

## 2015-03-19 SURGICAL SUPPLY — 48 items
BAG DECANTER FOR FLEXI CONT (MISCELLANEOUS) ×2 IMPLANT
BENZOIN TINCTURE PRP APPL 2/3 (GAUZE/BANDAGES/DRESSINGS) ×2 IMPLANT
BLADE CLIPPER SURG (BLADE) IMPLANT
CANISTER SUCT 3000ML PPV (MISCELLANEOUS) ×2 IMPLANT
DERMABOND ADVANCED (GAUZE/BANDAGES/DRESSINGS) ×3
DERMABOND ADVANCED .7 DNX12 (GAUZE/BANDAGES/DRESSINGS) ×3 IMPLANT
DRAPE LAPAROTOMY 100X72 PEDS (DRAPES) IMPLANT
DRAPE LAPAROTOMY 100X72X124 (DRAPES) IMPLANT
DRAPE POUCH INSTRU U-SHP 10X18 (DRAPES) ×2 IMPLANT
DRAPE SURG 17X23 STRL (DRAPES) ×8 IMPLANT
ELECT REM PT RETURN 9FT ADLT (ELECTROSURGICAL) ×2
ELECTRODE REM PT RTRN 9FT ADLT (ELECTROSURGICAL) ×1 IMPLANT
EVACUATOR SILICONE 100CC (DRAIN) ×2 IMPLANT
GAUZE SPONGE 4X4 12PLY STRL (GAUZE/BANDAGES/DRESSINGS) ×2 IMPLANT
GAUZE SPONGE 4X4 16PLY XRAY LF (GAUZE/BANDAGES/DRESSINGS) IMPLANT
GLOVE BIO SURGEON STRL SZ 6.5 (GLOVE) ×4 IMPLANT
GLOVE BIO SURGEON STRL SZ8 (GLOVE) ×4 IMPLANT
GLOVE BIO SURGEON STRL SZ8.5 (GLOVE) ×2 IMPLANT
GLOVE BIOGEL PI IND STRL 6.5 (GLOVE) ×1 IMPLANT
GLOVE BIOGEL PI INDICATOR 6.5 (GLOVE) ×1
GLOVE EXAM NITRILE LRG STRL (GLOVE) IMPLANT
GLOVE EXAM NITRILE MD LF STRL (GLOVE) IMPLANT
GLOVE EXAM NITRILE XL STR (GLOVE) IMPLANT
GLOVE EXAM NITRILE XS STR PU (GLOVE) IMPLANT
GLOVE SS BIOGEL STRL SZ 7 (GLOVE) ×1 IMPLANT
GLOVE SUPERSENSE BIOGEL SZ 7 (GLOVE) ×1
GOWN STRL REUS W/ TWL LRG LVL3 (GOWN DISPOSABLE) IMPLANT
GOWN STRL REUS W/ TWL XL LVL3 (GOWN DISPOSABLE) IMPLANT
GOWN STRL REUS W/TWL LRG LVL3 (GOWN DISPOSABLE)
GOWN STRL REUS W/TWL XL LVL3 (GOWN DISPOSABLE)
KIT BASIN OR (CUSTOM PROCEDURE TRAY) ×2 IMPLANT
KIT ROOM TURNOVER OR (KITS) ×2 IMPLANT
NEEDLE HYPO 22GX1.5 SAFETY (NEEDLE) IMPLANT
NS IRRIG 1000ML POUR BTL (IV SOLUTION) ×2 IMPLANT
PACK LAMINECTOMY NEURO (CUSTOM PROCEDURE TRAY) ×2 IMPLANT
PAD ARMBOARD 7.5X6 YLW CONV (MISCELLANEOUS) ×6 IMPLANT
STAPLER SKIN PROX WIDE 3.9 (STAPLE) ×2 IMPLANT
STRIP CLOSURE SKIN 1/2X4 (GAUZE/BANDAGES/DRESSINGS) ×2 IMPLANT
SUT ETHILON 2 0 FS 18 (SUTURE) ×2 IMPLANT
SUT VIC AB 1 CT1 18XBRD ANBCTR (SUTURE) IMPLANT
SUT VIC AB 1 CT1 8-18 (SUTURE)
SUT VIC AB 2-0 CP2 18 (SUTURE) IMPLANT
SUT VIC AB 3-0 SH 8-18 (SUTURE) ×2 IMPLANT
SWAB COLLECTION DEVICE MRSA (MISCELLANEOUS) IMPLANT
TOWEL OR 17X24 6PK STRL BLUE (TOWEL DISPOSABLE) ×2 IMPLANT
TOWEL OR 17X26 10 PK STRL BLUE (TOWEL DISPOSABLE) ×2 IMPLANT
TUBE ANAEROBIC SPECIMEN COL (MISCELLANEOUS) IMPLANT
WATER STERILE IRR 1000ML POUR (IV SOLUTION) ×2 IMPLANT

## 2015-03-19 NOTE — Transfer of Care (Signed)
Immediate Anesthesia Transfer of Care Note  Patient: Elaine Roach  Procedure(s) Performed: Procedure(s) with comments: CERVICAL WOUND EXPLORATION, EVACUATION OF CERVICAL HEMATOMA (N/A) - cervical wound exploration, evacuation of cervical hematoma  Patient Location: PACU  Anesthesia Type:General  Level of Consciousness: alert , oriented, patient cooperative and responds to stimulation  Airway & Oxygen Therapy: Patient Spontanous Breathing and Patient connected to nasal cannula oxygen  Post-op Assessment: Report given to RN, Post -op Vital signs reviewed and stable and Patient moving all extremities X 4  Post vital signs: Reviewed and stable  Last Vitals:  Filed Vitals:   03/19/15 1742  BP: 153/75  Pulse: 110  Temp:   Resp:     Complications: No apparent anesthesia complications

## 2015-03-19 NOTE — Progress Notes (Signed)
Pt came back from PACU to 5M06. Alert and oriented. Denies any pain. Some soreness. Dressing clean and intact.

## 2015-03-19 NOTE — Anesthesia Preprocedure Evaluation (Addendum)
Anesthesia Evaluation  Patient identified by MRN, date of birth, ID band Patient awake    Reviewed: Allergy & Precautions, H&P , NPO status , Patient's Chart, lab work & pertinent test results  History of Anesthesia Complications (+) PONV  Airway Mallampati: III  TM Distance: >3 FB Neck ROM: Limited    Dental no notable dental hx. (+) Teeth Intact, Dental Advisory Given   Pulmonary Current Smoker,    Pulmonary exam normal breath sounds clear to auscultation       Cardiovascular hypertension, Pt. on medications  Rhythm:Regular Rate:Normal     Neuro/Psych Depression negative neurological ROS     GI/Hepatic Neg liver ROS, GERD  Medicated and Controlled,  Endo/Other  diabetes, Type 2, Oral Hypoglycemic Agents  Renal/GU negative Renal ROS  negative genitourinary   Musculoskeletal  (+) Arthritis , Osteoarthritis,    Abdominal   Peds  Hematology negative hematology ROS (+)   Anesthesia Other Findings   Reproductive/Obstetrics negative OB ROS                            Anesthesia Physical Anesthesia Plan  ASA: II  Anesthesia Plan: General   Post-op Pain Management:    Induction: Intravenous  Airway Management Planned: Oral ETT and Video Laryngoscope Planned  Additional Equipment:   Intra-op Plan:   Post-operative Plan: Extubation in OR  Informed Consent: I have reviewed the patients History and Physical, chart, labs and discussed the procedure including the risks, benefits and alternatives for the proposed anesthesia with the patient or authorized representative who has indicated his/her understanding and acceptance.   Dental advisory given  Plan Discussed with: CRNA  Anesthesia Plan Comments:        Anesthesia Quick Evaluation

## 2015-03-19 NOTE — H&P (Signed)
Subjective: The patient is a 64 year old white female on whom I performed a 2 level anterior cervical discectomy, fusion and plating on 6 days ago. She has developed progressive dysphagia. X-rays demonstrate prevertebral soft tissue swelling consistent with a hematoma. I discussed the situation with the patient. We discussed observation versus surgery. The patient has decided to proceed with surgery.  Past Medical History  Diagnosis Date  . Complication of anesthesia   . PONV (postoperative nausea and vomiting)   . GERD (gastroesophageal reflux disease)   . Diabetes mellitus without complication ([place])     type 2    dx 10 yrs ago  . Nonalcoholic steatohepatitis ([facility])     DX IN 1995  . Depression   . History of OCD (obsessive compulsive disorder)     TO A DEGREE  . Hypertension   . Dysrhythmia     BENIGN PVC'S  . Arthritis   . Osteoarthritis     Past Surgical History  Procedure Laterality Date  . Shoulder arthroscopy w/ rotator cuff repair      2 SCOPES.Marland KitchenMarland KitchenALL ON THE RIGHT  . Ganglion cyst excision    . Carpal tunnel release      RIGHT  . Cholecystectomy    . Tubal ligation    . Bunionectomy      RIGHT  . Back surgery      No Known Allergies  Social History  Substance Use Topics  . Smoking status: Heavy Tobacco Smoker -- 2.00 packs/day for 17 years    Types: Cigarettes  . Smokeless tobacco: Never Used     Comment: quit when she was 54  . Alcohol Use: Yes     Comment: OCCASIONAL  WINE    History reviewed. No pertinent family history. Prior to Admission medications   Medication Sig Start Date End Date Taking? Authorizing Provider  diazepam (VALIUM) 5 MG tablet Take 1 tablet (5 mg total) by mouth every 6 (six) hours as needed for muscle spasms. 04/05/14   Newman Pies, MD  docusate sodium 100 MG CAPS Take 100 mg by mouth 2 (two) times daily. 04/05/14   Newman Pies, MD  lisinopril (PRINIVIL,ZESTRIL) 10 MG tablet Take 10 mg by mouth.    Historical Provider, MD   lisinopril-hydrochlorothiazide (PRINZIDE,ZESTORETIC) 20-12.5 MG per tablet Take 1 tablet by mouth daily.    Historical Provider, MD  metFORMIN (GLUCOPHAGE) 500 MG tablet Take 500 mg by mouth 2 (two) times daily with a meal.    Historical Provider, MD  Multiple Vitamin (MULTIVITAMIN WITH MINERALS) TABS tablet Take 1 tablet by mouth daily.    Historical Provider, MD  omeprazole (PRILOSEC) 40 MG capsule Take 40 mg by mouth daily.    Historical Provider, MD  oxyCODONE-acetaminophen (PERCOCET) 10-325 MG per tablet Take 1 tablet by mouth every 4 (four) hours as needed for pain. 04/05/14   Newman Pies, MD  pravastatin (PRAVACHOL) 20 MG tablet Take 20 mg by mouth at bedtime.    Historical Provider, MD  sertraline (ZOLOFT) 100 MG tablet Take 100 mg by mouth daily.    Historical Provider, MD     Review of Systems  Positive ROS: As above  All other systems have been reviewed and were otherwise negative with the exception of those mentioned in the HPI and as above.  Objective: Vital signs in last 24 hours: Temp:  [99.3 F (37.4 C)-100.4 F (38 C)] 99.3 F (37.4 C) (10/04 1739) Pulse Rate:  [102-110] 110 (10/04 1742) Resp:  [20] 20 (10/04 1739) BP: (  153-178)/(71-78) 153/75 mmHg (10/04 1742) SpO2:  [96 %-99 %] 99 % (10/04 1742) Weight:  [77.111 kg (170 lb)] 77.111 kg (170 lb) (10/04 2007)  General Appearance: Alert, cooperative, no distress, Head: Normocephalic, without obvious abnormality, atraumatic Eyes: PERRL, conjunctiva/corneas clear, EOM's intact,    Ears: Normal  Throat: Normal  Neck: The patient has erythema around her incision from the adhesive on the Steri-Strips. She has moderate swelling with mild midline shift. Back: Symmetric, no curvature, ROM normal, no CVA tenderness Lungs: Clear to auscultation bilaterally, respirations unlabored Heart: Regular rate and rhythm, no murmur, rub or gallop Abdomen: Soft, non-tender,, no masses, no organomegaly Extremities: Extremities  normal, atraumatic, no cyanosis or edema Pulses: 2+ and symmetric all extremities Skin: Skin color, texture, turgor normal, no rashes or lesions  NEUROLOGIC:   Mental status: alert and oriented, no aphasia, good attention span, Fund of knowledge/ memory ok Motor Exam - grossly normal Sensory Exam - grossly normal Reflexes:  Coordination - grossly normal Gait - grossly normal Balance - grossly normal Cranial Nerves: I: smell Not tested  II: visual acuity  OS: Normal  OD: Normal   II: visual fields Full to confrontation  II: pupils Equal, round, reactive to light  III,VII: ptosis None  III,IV,VI: extraocular muscles  Full ROM  V: mastication Normal  V: facial light touch sensation  Normal  V,VII: corneal reflex  Present  VII: facial muscle function - upper  Normal  VII: facial muscle function - lower Normal  VIII: hearing Not tested  IX: soft palate elevation  Normal  IX,X: gag reflex Present  XI: trapezius strength  5/5  XI: sternocleidomastoid strength 5/5  XI: neck flexion strength  5/5  XII: tongue strength  Normal    Data Review Lab Results  Component Value Date   WBC 7.3 04/05/2014   HGB 10.1* 04/05/2014   HCT 30.9* 04/05/2014   MCV 84.2 04/05/2014   PLT 238 04/05/2014   Lab Results  Component Value Date   NA 141 04/05/2014   K 5.4* 04/05/2014   CL 103 04/05/2014   CO2 26 04/05/2014   BUN 16 04/05/2014   CREATININE 0.90 04/05/2014   GLUCOSE 130* 04/05/2014   No results found for: INR, PROTIME  Assessment/Plan: Cervical hematoma, dysphagia: I have discussed the situation with the patient. We have discussed the various treatment options including observation versus surgery. I have described the surgical treatment option of an exploration of her wound with removal of her presumed cervical hematoma. I described the surgery to her. We discussed the risks including risks of anesthesia, hemorrhage, infection, injury to the various Neck structures, medical risk,  etc. I have answered all her questions. She has decided to proceed with surgery   Hiedi Touchton D 03/19/2015 8:36 PM

## 2015-03-19 NOTE — Progress Notes (Signed)
Pt arrived to the unit as a direct admit from MD office at 1430. Pt A&O x4; vitals taken, pt oriented to the unit and room including call light; fall prevention education completed with pt and pt voices understanding. Dr. Arnoldo Morale notified of pt arrival to the unit and vitals. Pt skin intact except for left neck closed incision. Pt in bed with call light within reach. Will closely monitor. Francis Gaines Nkechi Linehan RN.

## 2015-03-19 NOTE — Progress Notes (Signed)
Subjective:  The patient is alert and pleasant. She looks and feels much better.  Objective: Vital signs in last 24 hours: Temp:  [98.7 F (37.1 C)-100.4 F (38 C)] 98.7 F (37.1 C) (10/04 2201) Pulse Rate:  [102-110] 110 (10/04 1742) Resp:  [20] 20 (10/04 1739) BP: (153-178)/(70-78) 162/70 mmHg (10/04 2159) SpO2:  [96 %-99 %] 99 % (10/04 1742) Weight:  [77.111 kg (170 lb)] 77.111 kg (170 lb) (10/04 2007)  Intake/Output from previous day:   Intake/Output this shift: Total I/O In: 900 [I.V.:900] Out: -   Physical exam the patient is alert and pleasant. She is moving all 4 extremities well. Her dressing is clean and dry.  Lab Results: No results for input(s): WBC, HGB, HCT, PLT in the last 72 hours. BMET No results for input(s): NA, K, CL, CO2, GLUCOSE, BUN, CREATININE, CALCIUM in the last 72 hours.  Studies/Results: No results found.  Assessment/Plan: The patient is doing well.  LOS: 0 days     Espen Bethel D 03/19/2015, 10:12 PM

## 2015-03-19 NOTE — Op Note (Signed)
Brief history: The patient is a 64 year old white female on whom I performed a 2 level anterior cervical discectomy, fusion, and plating 6 days ago. She has developed progressive dysphagia. I saw her in the office today and obtain an x-ray which demonstrated prevertebral soft tissue swelling consistent with a hematoma. I discussed the various treatment options with the patient including surgery. She has decided to proceed with a evacuation of her cervical hematoma.  Preop diagnosis: Cervical hematoma, dysphagia  Postoperative diagnosis: The same  Procedure: Evacuation of cervical hematoma  Surgeon: Dr. Earle Gell  Assistant: None  Anesthesia: Gen. endotracheal  Specimens: None  Complications: None  Drains: One 10 mm flat Jackson-Pratt drain in the prevertebral space.  Description of procedure: The patient was brought to the operating room by the anesthesia team. General endotracheal anesthesia was induced. The patient remained in the supine position. A roll was placed under her shoulders to keep her neck in a neutral position. The patient's anterior cervical region was then prepared with Betadine scrub and Betadine solution. Sterile drapes were applied. I then incised the patient's fresh surgical incision with the scalpel. I used the mass mom scissors to divide the sutures and the platysmal muscle. I then dissected medial to the sternocleidomastoid muscle and jugular vein and carotid artery and encountered a semi-liquefied hematoma. I evacuated the hematoma with suction and irrigation. I did not see any active bleeding points. I inspected the esophagus. It appeared normal. I then placed a 10 mm flat Jackson-Pratt drain in the prevertebral space. I tunneled it out through a separate stab wound. I then reapproximated patient's platysmal muscle with interrupted 30 Vicryls suture. I reapproximated the patient's subcutaneous tissue with interrupted 30 Vicryls suture. I then reapproximated the  patient's skin with Dermabond. I placed a figure-of-eight 2-0 nylon suture around the drain exit site as we were having some difficulty maintaining suction on the drain. The wound was then coated with bacitracin ointment. A sterile dressing was applied. The drapes were removed. By report all sponge, instrument, and needle counts were correct at the end of this case.

## 2015-03-19 NOTE — Anesthesia Procedure Notes (Signed)
Procedure Name: Intubation Date/Time: 03/19/2015 9:41 PM Performed by: Elaine Roach Pre-anesthesia Checklist: Patient identified, Emergency Drugs available, Suction available, Patient being monitored and Timeout performed Patient Re-evaluated:Patient Re-evaluated prior to inductionOxygen Delivery Method: Circle system utilized Preoxygenation: Pre-oxygenation with 100% oxygen Intubation Type: IV induction, Rapid sequence and Cricoid Pressure applied Ventilation: Mask ventilation without difficulty Laryngoscope Size: Mac and 3 Grade View: Grade III Tube type: Oral Tube size: 8.0 mm Number of attempts: 1 Airway Equipment and Method: Stylet Placement Confirmation: ETT inserted through vocal cords under direct vision,  positive ETCO2 and breath sounds checked- equal and bilateral Secured at: 24 cm Tube secured with: Tape Dental Injury: Teeth and Oropharynx as per pre-operative assessment

## 2015-03-20 ENCOUNTER — Encounter (HOSPITAL_COMMUNITY): Payer: Self-pay | Admitting: General Practice

## 2015-03-20 LAB — GLUCOSE, CAPILLARY
Glucose-Capillary: 113 mg/dL — ABNORMAL HIGH (ref 65–99)
Glucose-Capillary: 182 mg/dL — ABNORMAL HIGH (ref 65–99)
Glucose-Capillary: 166 mg/dL — ABNORMAL HIGH (ref 65–99)
Glucose-Capillary: 202 mg/dL — ABNORMAL HIGH (ref 65–99)
Glucose-Capillary: 124 mg/dL — ABNORMAL HIGH (ref 65–99)

## 2015-03-20 MED ORDER — INFLUENZA VAC SPLIT QUAD 0.5 ML IM SUSY
0.5000 mL | PREFILLED_SYRINGE | INTRAMUSCULAR | Status: AC
  Administered 2015-03-21: 0.5 mL via INTRAMUSCULAR
  Filled 2015-03-20 (×2): qty 0.5

## 2015-03-20 NOTE — Anesthesia Postprocedure Evaluation (Signed)
  Anesthesia Post-op Note  Patient: Elaine Roach  Procedure(s) Performed: Procedure(s) with comments: CERVICAL WOUND EXPLORATION, EVACUATION OF CERVICAL HEMATOMA (N/A) - cervical wound exploration, evacuation of cervical hematoma  Patient Location: PACU  Anesthesia Type:General  Level of Consciousness: awake and alert   Airway and Oxygen Therapy: Patient Spontanous Breathing  Post-op Pain: Controlled  Post-op Assessment: Post-op Vital signs reviewed, Patient's Cardiovascular Status Stable and Respiratory Function Stable  Post-op Vital Signs: Reviewed  Filed Vitals:   03/19/15 2218  BP:   Pulse:   Temp: 36.5 C  Resp:     Complications: No apparent anesthesia complications

## 2015-03-20 NOTE — Progress Notes (Signed)
Patient ID: Elaine Roach, female   DOB: July 17, 1950, 64 y.o.   MRN: [id number] Subjective:  The patient is alert and pleasant. She looks and feels much better. She has mild dysphagia.  Objective: Vital signs in last 24 hours: Temp:  [97.7 F (36.5 C)-100.4 F (38 C)] 98.2 F (36.8 C) (10/05 0617) Pulse Rate:  [94-110] 94 (10/05 0617) Resp:  [18-20] 18 (10/05 0617) BP: (135-178)/(57-78) 136/72 mmHg (10/05 0617) SpO2:  [95 %-99 %] 98 % (10/05 0617) Weight:  [77.111 kg (170 lb)] 77.111 kg (170 lb) (10/04 2007)  Intake/Output from previous day: 10/04 0701 - 10/05 0700 In: 900 [I.V.:900] Out: -  Intake/Output this shift: Total I/O In: 900 [I.V.:900] Out: -   Physical exam the patient is alert and oriented. She is moving all 4 extremities well. Her wound is healing well. There is no hematoma or shift. The drain had a leak and could not keep suction so I removed it.  Lab Results: No results for input(s): WBC, HGB, HCT, PLT in the last 72 hours. BMET No results for input(s): NA, K, CL, CO2, GLUCOSE, BUN, CREATININE, CALCIUM in the last 72 hours.  Studies/Results: No results found.  Assessment/Plan: Postop day #1 status post removal of cervical hematoma. The patient is doing much better. She will likely go home tomorrow.  LOS: 1 day     Poetry Cerro D 03/20/2015, 6:43 AM

## 2015-03-21 LAB — GLUCOSE, CAPILLARY: Glucose-Capillary: 115 mg/dL — ABNORMAL HIGH (ref 65–99)

## 2015-03-21 NOTE — Progress Notes (Signed)
Patient discharged home. Patient refused wheelchair escort to car. Patient denies any pain, neuro assessment unchanged, new incision covered with gauze clean, dry, intact. Previous incision left open to air, per patient by MD request. Incision is clean, dry, edges approximated, no drainage noted. Patient states she has "very slight" issues with swallowing, patient has been tolerating a regular diet, patient educated on techniques to assist with swallowing including small bites, tucking chin, swallowing twice after each bite. Patient vocalized understanding. RN educated patient on follow up appointment, s/sx infection, symptoms to report to MD. IV removed, patient took belongings.

## 2015-03-21 NOTE — Discharge Summary (Signed)
Physician Discharge Summary  Patient ID: Elaine Roach MRN: [id number] DOB/AGE: March 09, 1951 64 y.o.  Admit date: 03/19/2015 Discharge date: 03/21/2015  Admission Diagnoses: Cervical hematoma, dysphagia  Discharge Diagnoses: The same Active Problems:   Dysphagia   Discharged Condition: good  Hospital Course: I evacuated a cervical hematoma on the patient on 03/19/2015. The patient did well. On 03/21/2015 the patient requested discharge to home. She was given oral and written discharge instructions. All her questions were answered.  Consults: None Significant Diagnostic Studies: None Treatments: Evacuation of cervical hematoma Discharge Exam: Blood pressure 146/56, pulse 77, temperature 98.6 F (37 C), temperature source Oral, resp. rate 19, height 5' 3"  (1.6 m), weight 77.111 kg (170 lb), SpO2 98 %. The patient is alert and pleasant. She looks and feels well. Her dressing is clean and dry. There is no evidence of hematoma or shift. Her strength is normal.  Disposition: Home  Discharge Instructions    Call MD for:  difficulty breathing, headache or visual disturbances    Complete by:  As directed      Call MD for:  extreme fatigue    Complete by:  As directed      Call MD for:  hives    Complete by:  As directed      Call MD for:  persistant dizziness or light-headedness    Complete by:  As directed      Call MD for:  persistant nausea and vomiting    Complete by:  As directed      Call MD for:  redness, tenderness, or signs of infection (pain, swelling, redness, odor or green/yellow discharge around incision site)    Complete by:  As directed      Call MD for:  severe uncontrolled pain    Complete by:  As directed      Call MD for:  temperature >100.4    Complete by:  As directed      Diet - low sodium heart healthy    Complete by:  As directed      Discharge instructions    Complete by:  As directed   Call [phone] for a followup appointment. Take a stool softener  while you are using pain medications.     Driving Restrictions    Complete by:  As directed   Do not drive for 2 weeks.     Increase activity slowly    Complete by:  As directed      Lifting restrictions    Complete by:  As directed   Do not lift more than 5 pounds. No excessive bending or twisting.     May shower / Bathe    Complete by:  As directed   He may shower after the pain she is removed 3 days after surgery. Leave the incision alone.     Remove dressing in 24 hours    Complete by:  As directed             Medication List    TAKE these medications        atorvastatin 20 MG tablet  Commonly known as:  LIPITOR  Take 20 mg by mouth daily.     cetirizine 10 MG tablet  Commonly known as:  ZYRTEC  Take 10 mg by mouth daily.     losartan-hydrochlorothiazide 100-12.5 MG tablet  Commonly known as:  HYZAAR  Take 1 tablet by mouth daily.     metFORMIN 500 MG tablet  Commonly known as:  GLUCOPHAGE  Take 500 mg by mouth 2 (two) times daily with a meal.     multivitamin with minerals Tabs tablet  Take 1 tablet by mouth daily.     omeprazole 40 MG capsule  Commonly known as:  PRILOSEC  Take 40 mg by mouth daily.     sertraline 100 MG tablet  Commonly known as:  ZOLOFT  Take 100 mg by mouth daily.         SignedNewman Pies D 03/21/2015, 7:57 AM

## 2015-03-21 NOTE — Discharge Instructions (Signed)
Anterior Cervical Diskectomy and Fusion Anterior cervical diskectomy and fusion is a surgery that is done on the neck (cervical spine) to take pressure off of the nerves or the spinal cord. It is performed through the front (anterior) part of the neck. During this surgery, the damaged disk that is causing pain, numbness, or weakness is removed. The area where the disk was removed is filled with a plastic spacer implant, a bone graft, or both. These implants and bone grafts take pressure off of the nerves and spinal cord by making more room for the nerves to leave the spine. LET [facility] CARE PROVIDER KNOW ABOUT:  Any allergies you have.  All medicines you are taking, including vitamins, herbs, eye drops, creams, and over-the-counter medicines.  Previous problems you or members of your family have had with the use of anesthetics.  Any blood disorders you have.  Previous surgeries you have had.  Any medical conditions you may have. RISKS AND COMPLICATIONS Generally, this is a safe procedure. However, problems may occur, including:  Infection.  Bleeding with the possible need for blood transfusion.  Injury to surrounding structures, including nerves.  Leakage of fluid from the brain or spinal cord (cerebrospinal fluid).  Blood clots.  Temporary breathing difficulties after surgery. BEFORE THE PROCEDURE  Follow your health care provider's instructions about eating or drinking restrictions.  Ask your health care provider about:  Changing or stopping your regular medicines. This is especially important if you are taking diabetes medicines or blood thinners.  Taking medicines such as aspirin and ibuprofen. These medicines can thin your blood. Do not take these medicines before your procedure if your health care provider instructs you not to.  You may be given antibiotic medicines to help prevent infection.  Your incision site may be marked on your neck. PROCEDURE  An IV tube  will be inserted into one of your veins.  You will be given one or more of the following:  A medicine that helps you relax (sedative).  A medicine that makes you fall asleep (general anesthetic).  A breathing tube will be placed.  Your neck will be cleaned with a germ-killing solution (antiseptic).  Your surgeon will make an incision on the front of your neck, usually within a skinfold line.  Your neck muscles will be spread apart, and the damaged disk and bone spurs will be removed.  The area where the disk was removed will be filled with a small plastic spacer implant, a bone graft, or both.  Your surgeon may put metal plates and screws (hardware) in your neck. This helps to stabilize the surgical site and keep implants and bone grafts in place. The hardware reduces motion at the surgical site so your bones can grow together (fuse). This provides extra support to your neck.  The incision will be closed with stitches (sutures).  A bandage (dressing) will be applied to cover the incision. The procedure may vary among health care providers and hospitals. AFTER THE PROCEDURE  Your blood pressure, heart rate, breathing rate, and blood oxygen level will be monitored often until the medicines you were given have worn off.  You will be monitored for any signs of complications from the procedure, such as:  Too much bleeding from the incision site.  A buildup of blood under your skin at the surgical site.  Difficulty breathing.  You may continue to receive antibiotics.  You can start to eat as soon as you feel comfortable.  You may be given  a neck brace to wear after surgery. This brace limits your neck movement while your bones are fusing together. Follow your health care provider's instructions about how often and how long you need to wear this.   This information is not intended to replace advice given to you by your health care provider. Make sure you discuss any questions you  have with your health care provider.   Document Released: 05/20/2009 Document Revised: 06/22/2014 Document Reviewed: 05/20/2009 Elsevier Interactive Patient Education [employer].

## 2015-07-25 ENCOUNTER — Other Ambulatory Visit: Payer: Self-pay | Admitting: Neurosurgery

## 2015-07-25 DIAGNOSIS — M545 Low back pain: Secondary | ICD-10-CM

## 2015-08-09 ENCOUNTER — Ambulatory Visit
Admission: RE | Admit: 2015-08-09 | Discharge: 2015-08-09 | Disposition: A | Discharge status: Home / Self Care | Payer: BLUE CROSS/BLUE SHIELD | Source: Ambulatory Visit | Attending: Neurosurgery | Admitting: Neurosurgery

## 2015-08-09 DIAGNOSIS — M545 Low back pain: Secondary | ICD-10-CM

## 2015-08-09 MED ORDER — GADOBENATE DIMEGLUMINE 529 MG/ML IV SOLN
17.0000 mL | Freq: Once | INTRAVENOUS | Status: AC | PRN
  Administered 2015-08-09: 17 mL via INTRAVENOUS

## 2015-08-25 DIAGNOSIS — E119 Type 2 diabetes mellitus without complications: Secondary | ICD-10-CM | POA: Insufficient documentation

## 2015-08-25 DIAGNOSIS — I2510 Atherosclerotic heart disease of native coronary artery without angina pectoris: Secondary | ICD-10-CM | POA: Insufficient documentation

## 2015-08-25 DIAGNOSIS — I10 Essential (primary) hypertension: Secondary | ICD-10-CM | POA: Insufficient documentation

## 2015-08-25 DIAGNOSIS — F5101 Primary insomnia: Secondary | ICD-10-CM | POA: Insufficient documentation

## 2015-08-25 DIAGNOSIS — K7581 Nonalcoholic steatohepatitis (NASH): Secondary | ICD-10-CM | POA: Insufficient documentation

## 2015-08-25 DIAGNOSIS — G479 Sleep disorder, unspecified: Secondary | ICD-10-CM | POA: Insufficient documentation

## 2015-08-25 DIAGNOSIS — K219 Gastro-esophageal reflux disease without esophagitis: Secondary | ICD-10-CM | POA: Insufficient documentation

## 2015-08-25 DIAGNOSIS — E782 Mixed hyperlipidemia: Secondary | ICD-10-CM | POA: Insufficient documentation

## 2015-08-25 DIAGNOSIS — F419 Anxiety disorder, unspecified: Secondary | ICD-10-CM | POA: Insufficient documentation

## 2015-08-26 DIAGNOSIS — G894 Chronic pain syndrome: Secondary | ICD-10-CM | POA: Insufficient documentation

## 2015-08-26 DIAGNOSIS — J309 Allergic rhinitis, unspecified: Secondary | ICD-10-CM | POA: Insufficient documentation

## 2015-08-26 DIAGNOSIS — J019 Acute sinusitis, unspecified: Secondary | ICD-10-CM | POA: Insufficient documentation

## 2015-11-13 ENCOUNTER — Other Ambulatory Visit: Payer: Self-pay | Admitting: Neurosurgery

## 2015-11-13 DIAGNOSIS — M545 Low back pain, unspecified: Secondary | ICD-10-CM

## 2015-11-13 DIAGNOSIS — M542 Cervicalgia: Secondary | ICD-10-CM

## 2015-11-13 DIAGNOSIS — G8929 Other chronic pain: Secondary | ICD-10-CM

## 2015-11-20 ENCOUNTER — Ambulatory Visit
Admission: RE | Admit: 2015-11-20 | Discharge: 2015-11-20 | Disposition: A | Discharge status: Home / Self Care | Payer: BLUE CROSS/BLUE SHIELD | Source: Ambulatory Visit | Attending: Neurosurgery | Admitting: Neurosurgery

## 2015-11-20 VITALS — BP 129/72 | HR 87

## 2015-11-20 DIAGNOSIS — M545 Low back pain, unspecified: Secondary | ICD-10-CM

## 2015-11-20 DIAGNOSIS — G8929 Other chronic pain: Secondary | ICD-10-CM

## 2015-11-20 DIAGNOSIS — M542 Cervicalgia: Secondary | ICD-10-CM

## 2015-11-20 DIAGNOSIS — M48061 Spinal stenosis, lumbar region without neurogenic claudication: Secondary | ICD-10-CM

## 2015-11-20 DIAGNOSIS — M48062 Spinal stenosis, lumbar region with neurogenic claudication: Secondary | ICD-10-CM

## 2015-11-20 MED ORDER — MEPERIDINE HCL 100 MG/ML IJ SOLN
75.0000 mg | Freq: Once | INTRAMUSCULAR | Status: AC
  Administered 2015-11-20: 75 mg via INTRAMUSCULAR

## 2015-11-20 MED ORDER — ONDANSETRON HCL 4 MG/2ML IJ SOLN
4.0000 mg | Freq: Once | INTRAMUSCULAR | Status: AC
  Administered 2015-11-20: 4 mg via INTRAMUSCULAR

## 2015-11-20 MED ORDER — IOPAMIDOL (ISOVUE-M 300) INJECTION 61%
10.0000 mL | Freq: Once | INTRAMUSCULAR | Status: AC | PRN
  Administered 2015-11-20: 10 mL via INTRATHECAL

## 2015-11-20 MED ORDER — DIAZEPAM 5 MG PO TABS
10.0000 mg | ORAL_TABLET | Freq: Once | ORAL | Status: AC
  Administered 2015-11-20: 10 mg via ORAL

## 2015-11-20 NOTE — Progress Notes (Signed)
Patient states she has been off Zoloft for at least the past two days.  jkl

## 2015-11-20 NOTE — Discharge Instructions (Signed)
Myelogram Discharge Instructions  1. Go home and rest quietly for the next 24 hours.  It is important to lie flat for the next 24 hours.  Get up only to go to the restroom.  You may lie in the bed or on a couch on your back, your stomach, your left side or your right side.  You may have one pillow under your head.  You may have pillows between your knees while you are on your side or under your knees while you are on your back.  2. DO NOT drive today.  Recline the seat as far back as it will go, while still wearing your seat belt, on the way home.  3. You may get up to go to the bathroom as needed.  You may sit up for 10 minutes to eat.  You may resume your normal diet and medications unless otherwise indicated.  Drink lots of extra fluids today and tomorrow.  4. The incidence of headache, nausea, or vomiting is about 5% (one in 20 patients).  If you develop a headache, lie flat and drink plenty of fluids until the headache goes away.  Caffeinated beverages may be helpful.  If you develop severe nausea and vomiting or a headache that does not go away with flat bed rest, call [phone].  5. You may resume normal activities after your 24 hours of bed rest is over; however, do not exert yourself strongly or do any heavy lifting tomorrow. If when you get up you have a headache when standing, go back to bed and force fluids for another 24 hours.  6. Call your physician for a follow-up appointment.  The results of your myelogram will be sent directly to your physician by the following day.  7. If you have any questions or if complications develop after you arrive home, please call [phone].  Discharge instructions have been explained to the patient.  The patient, or the person responsible for the patient, fully understands these instructions.       May resume Zoloft on November 21, 2015, after 1:00 pm.

## 2016-01-28 ENCOUNTER — Other Ambulatory Visit: Payer: Self-pay | Admitting: Family Medicine

## 2016-01-28 DIAGNOSIS — Z1231 Encounter for screening mammogram for malignant neoplasm of breast: Secondary | ICD-10-CM

## 2016-02-18 DIAGNOSIS — I10 Essential (primary) hypertension: Secondary | ICD-10-CM | POA: Diagnosis not present

## 2016-02-18 DIAGNOSIS — Z6832 Body mass index (BMI) 32.0-32.9, adult: Secondary | ICD-10-CM | POA: Diagnosis not present

## 2016-02-18 DIAGNOSIS — M1288 Other specific arthropathies, not elsewhere classified, other specified site: Secondary | ICD-10-CM | POA: Diagnosis not present

## 2016-02-18 DIAGNOSIS — M542 Cervicalgia: Secondary | ICD-10-CM | POA: Diagnosis not present

## 2016-02-23 DIAGNOSIS — N879 Dysplasia of cervix uteri, unspecified: Secondary | ICD-10-CM | POA: Insufficient documentation

## 2016-02-23 DIAGNOSIS — IMO0001 Reserved for inherently not codable concepts without codable children: Secondary | ICD-10-CM | POA: Insufficient documentation

## 2016-02-24 DIAGNOSIS — K219 Gastro-esophageal reflux disease without esophagitis: Secondary | ICD-10-CM | POA: Diagnosis not present

## 2016-02-24 DIAGNOSIS — K7581 Nonalcoholic steatohepatitis (NASH): Secondary | ICD-10-CM | POA: Diagnosis not present

## 2016-02-24 DIAGNOSIS — F5101 Primary insomnia: Secondary | ICD-10-CM | POA: Diagnosis not present

## 2016-02-24 DIAGNOSIS — F419 Anxiety disorder, unspecified: Secondary | ICD-10-CM | POA: Diagnosis not present

## 2016-02-24 DIAGNOSIS — E119 Type 2 diabetes mellitus without complications: Secondary | ICD-10-CM | POA: Diagnosis not present

## 2016-02-24 DIAGNOSIS — E782 Mixed hyperlipidemia: Secondary | ICD-10-CM | POA: Diagnosis not present

## 2016-02-24 DIAGNOSIS — I2510 Atherosclerotic heart disease of native coronary artery without angina pectoris: Secondary | ICD-10-CM | POA: Diagnosis not present

## 2016-02-24 DIAGNOSIS — I10 Essential (primary) hypertension: Secondary | ICD-10-CM | POA: Diagnosis not present

## 2016-02-28 ENCOUNTER — Ambulatory Visit: Payer: BLUE CROSS/BLUE SHIELD

## 2016-03-06 ENCOUNTER — Ambulatory Visit
Admission: RE | Admit: 2016-03-06 | Discharge: 2016-03-06 | Disposition: A | Discharge status: Home / Self Care | Payer: PPO | Source: Ambulatory Visit | Attending: Family Medicine | Admitting: Family Medicine

## 2016-03-06 DIAGNOSIS — Z1231 Encounter for screening mammogram for malignant neoplasm of breast: Secondary | ICD-10-CM

## 2016-03-10 DIAGNOSIS — M542 Cervicalgia: Secondary | ICD-10-CM | POA: Diagnosis not present

## 2016-03-10 DIAGNOSIS — M47812 Spondylosis without myelopathy or radiculopathy, cervical region: Secondary | ICD-10-CM | POA: Diagnosis not present

## 2016-03-11 ENCOUNTER — Other Ambulatory Visit: Payer: Self-pay | Admitting: Family Medicine

## 2016-03-11 DIAGNOSIS — R928 Other abnormal and inconclusive findings on diagnostic imaging of breast: Secondary | ICD-10-CM

## 2016-03-16 ENCOUNTER — Ambulatory Visit
Admission: RE | Admit: 2016-03-16 | Discharge: 2016-03-16 | Disposition: A | Discharge status: Home / Self Care | Payer: PPO | Source: Ambulatory Visit | Attending: Family Medicine | Admitting: Family Medicine

## 2016-03-16 DIAGNOSIS — R928 Other abnormal and inconclusive findings on diagnostic imaging of breast: Secondary | ICD-10-CM

## 2016-04-06 DIAGNOSIS — M47812 Spondylosis without myelopathy or radiculopathy, cervical region: Secondary | ICD-10-CM | POA: Diagnosis not present

## 2016-04-06 DIAGNOSIS — M542 Cervicalgia: Secondary | ICD-10-CM | POA: Diagnosis not present

## 2016-06-04 DIAGNOSIS — M25511 Pain in right shoulder: Secondary | ICD-10-CM | POA: Diagnosis not present

## 2016-06-06 DIAGNOSIS — M25511 Pain in right shoulder: Secondary | ICD-10-CM | POA: Diagnosis not present

## 2016-06-06 DIAGNOSIS — M19011 Primary osteoarthritis, right shoulder: Secondary | ICD-10-CM | POA: Diagnosis not present

## 2016-06-06 DIAGNOSIS — S4991XA Unspecified injury of right shoulder and upper arm, initial encounter: Secondary | ICD-10-CM | POA: Diagnosis not present

## 2016-06-06 DIAGNOSIS — M7551 Bursitis of right shoulder: Secondary | ICD-10-CM | POA: Diagnosis not present

## 2016-06-18 DIAGNOSIS — M25511 Pain in right shoulder: Secondary | ICD-10-CM | POA: Diagnosis not present

## 2016-06-18 DIAGNOSIS — M19011 Primary osteoarthritis, right shoulder: Secondary | ICD-10-CM | POA: Diagnosis not present

## 2016-06-18 DIAGNOSIS — M67911 Unspecified disorder of synovium and tendon, right shoulder: Secondary | ICD-10-CM | POA: Diagnosis not present

## 2016-07-06 DIAGNOSIS — R87612 Low grade squamous intraepithelial lesion on cytologic smear of cervix (LGSIL): Secondary | ICD-10-CM | POA: Diagnosis not present

## 2016-07-06 DIAGNOSIS — Z124 Encounter for screening for malignant neoplasm of cervix: Secondary | ICD-10-CM | POA: Diagnosis not present

## 2016-07-06 DIAGNOSIS — N879 Dysplasia of cervix uteri, unspecified: Secondary | ICD-10-CM | POA: Diagnosis not present

## 2016-07-09 DIAGNOSIS — M7551 Bursitis of right shoulder: Secondary | ICD-10-CM | POA: Diagnosis not present

## 2016-07-09 DIAGNOSIS — M67911 Unspecified disorder of synovium and tendon, right shoulder: Secondary | ICD-10-CM | POA: Diagnosis not present

## 2016-07-09 DIAGNOSIS — M25511 Pain in right shoulder: Secondary | ICD-10-CM | POA: Diagnosis not present

## 2016-07-09 DIAGNOSIS — M19011 Primary osteoarthritis, right shoulder: Secondary | ICD-10-CM | POA: Diagnosis not present

## 2016-08-05 DIAGNOSIS — H43393 Other vitreous opacities, bilateral: Secondary | ICD-10-CM | POA: Diagnosis not present

## 2016-08-05 DIAGNOSIS — H2513 Age-related nuclear cataract, bilateral: Secondary | ICD-10-CM | POA: Diagnosis not present

## 2016-08-05 DIAGNOSIS — Z7984 Long term (current) use of oral hypoglycemic drugs: Secondary | ICD-10-CM | POA: Diagnosis not present

## 2016-08-05 DIAGNOSIS — E119 Type 2 diabetes mellitus without complications: Secondary | ICD-10-CM | POA: Diagnosis not present

## 2016-08-11 DIAGNOSIS — E119 Type 2 diabetes mellitus without complications: Secondary | ICD-10-CM | POA: Diagnosis not present

## 2016-08-11 DIAGNOSIS — E782 Mixed hyperlipidemia: Secondary | ICD-10-CM | POA: Diagnosis not present

## 2016-08-11 DIAGNOSIS — I10 Essential (primary) hypertension: Secondary | ICD-10-CM | POA: Diagnosis not present

## 2016-08-25 DIAGNOSIS — I10 Essential (primary) hypertension: Secondary | ICD-10-CM | POA: Diagnosis not present

## 2016-08-25 DIAGNOSIS — F5101 Primary insomnia: Secondary | ICD-10-CM | POA: Diagnosis not present

## 2016-08-25 DIAGNOSIS — E782 Mixed hyperlipidemia: Secondary | ICD-10-CM | POA: Diagnosis not present

## 2016-08-25 DIAGNOSIS — I2510 Atherosclerotic heart disease of native coronary artery without angina pectoris: Secondary | ICD-10-CM | POA: Diagnosis not present

## 2016-08-25 DIAGNOSIS — F419 Anxiety disorder, unspecified: Secondary | ICD-10-CM | POA: Diagnosis not present

## 2016-08-25 DIAGNOSIS — K219 Gastro-esophageal reflux disease without esophagitis: Secondary | ICD-10-CM | POA: Diagnosis not present

## 2016-08-25 DIAGNOSIS — E119 Type 2 diabetes mellitus without complications: Secondary | ICD-10-CM | POA: Diagnosis not present

## 2016-11-13 DIAGNOSIS — H2511 Age-related nuclear cataract, right eye: Secondary | ICD-10-CM | POA: Diagnosis not present

## 2016-12-08 DIAGNOSIS — K219 Gastro-esophageal reflux disease without esophagitis: Secondary | ICD-10-CM | POA: Diagnosis not present

## 2016-12-08 DIAGNOSIS — H2511 Age-related nuclear cataract, right eye: Secondary | ICD-10-CM | POA: Diagnosis not present

## 2016-12-08 DIAGNOSIS — Z7984 Long term (current) use of oral hypoglycemic drugs: Secondary | ICD-10-CM | POA: Diagnosis not present

## 2016-12-08 DIAGNOSIS — I10 Essential (primary) hypertension: Secondary | ICD-10-CM | POA: Diagnosis not present

## 2016-12-08 DIAGNOSIS — E119 Type 2 diabetes mellitus without complications: Secondary | ICD-10-CM | POA: Diagnosis not present

## 2016-12-08 DIAGNOSIS — Z79899 Other long term (current) drug therapy: Secondary | ICD-10-CM | POA: Diagnosis not present

## 2016-12-08 DIAGNOSIS — H259 Unspecified age-related cataract: Secondary | ICD-10-CM | POA: Diagnosis not present

## 2017-01-12 DIAGNOSIS — I10 Essential (primary) hypertension: Secondary | ICD-10-CM | POA: Diagnosis not present

## 2017-01-12 DIAGNOSIS — E785 Hyperlipidemia, unspecified: Secondary | ICD-10-CM | POA: Diagnosis not present

## 2017-01-12 DIAGNOSIS — F429 Obsessive-compulsive disorder, unspecified: Secondary | ICD-10-CM | POA: Diagnosis not present

## 2017-01-12 DIAGNOSIS — Z79899 Other long term (current) drug therapy: Secondary | ICD-10-CM | POA: Diagnosis not present

## 2017-01-12 DIAGNOSIS — H2512 Age-related nuclear cataract, left eye: Secondary | ICD-10-CM | POA: Diagnosis not present

## 2017-01-12 DIAGNOSIS — E119 Type 2 diabetes mellitus without complications: Secondary | ICD-10-CM | POA: Diagnosis not present

## 2017-01-12 DIAGNOSIS — H259 Unspecified age-related cataract: Secondary | ICD-10-CM | POA: Diagnosis not present

## 2017-01-12 DIAGNOSIS — E1136 Type 2 diabetes mellitus with diabetic cataract: Secondary | ICD-10-CM | POA: Diagnosis not present

## 2017-01-12 DIAGNOSIS — Z7984 Long term (current) use of oral hypoglycemic drugs: Secondary | ICD-10-CM | POA: Diagnosis not present

## 2017-01-12 DIAGNOSIS — K219 Gastro-esophageal reflux disease without esophagitis: Secondary | ICD-10-CM | POA: Diagnosis not present

## 2017-01-13 DIAGNOSIS — M4692 Unspecified inflammatory spondylopathy, cervical region: Secondary | ICD-10-CM | POA: Diagnosis not present

## 2017-01-13 DIAGNOSIS — M542 Cervicalgia: Secondary | ICD-10-CM | POA: Diagnosis not present

## 2017-01-21 ENCOUNTER — Other Ambulatory Visit: Payer: Self-pay | Admitting: Neurosurgery

## 2017-01-21 DIAGNOSIS — C44311 Basal cell carcinoma of skin of nose: Secondary | ICD-10-CM | POA: Diagnosis not present

## 2017-01-21 DIAGNOSIS — D485 Neoplasm of uncertain behavior of skin: Secondary | ICD-10-CM | POA: Diagnosis not present

## 2017-01-21 DIAGNOSIS — M542 Cervicalgia: Principal | ICD-10-CM

## 2017-01-21 DIAGNOSIS — G8929 Other chronic pain: Secondary | ICD-10-CM

## 2017-01-25 ENCOUNTER — Other Ambulatory Visit: Payer: Self-pay | Admitting: Family Medicine

## 2017-01-25 DIAGNOSIS — Z1231 Encounter for screening mammogram for malignant neoplasm of breast: Secondary | ICD-10-CM

## 2017-02-03 ENCOUNTER — Ambulatory Visit
Admission: RE | Admit: 2017-02-03 | Discharge: 2017-02-03 | Disposition: A | Discharge status: Home / Self Care | Payer: PPO | Source: Ambulatory Visit | Attending: Neurosurgery | Admitting: Neurosurgery

## 2017-02-03 DIAGNOSIS — G8929 Other chronic pain: Secondary | ICD-10-CM

## 2017-02-03 DIAGNOSIS — M4802 Spinal stenosis, cervical region: Secondary | ICD-10-CM | POA: Diagnosis not present

## 2017-02-03 DIAGNOSIS — M542 Cervicalgia: Principal | ICD-10-CM

## 2017-02-17 DIAGNOSIS — J40 Bronchitis, not specified as acute or chronic: Secondary | ICD-10-CM | POA: Diagnosis not present

## 2017-02-19 DIAGNOSIS — I10 Essential (primary) hypertension: Secondary | ICD-10-CM | POA: Diagnosis not present

## 2017-02-19 DIAGNOSIS — Z6831 Body mass index (BMI) 31.0-31.9, adult: Secondary | ICD-10-CM | POA: Diagnosis not present

## 2017-02-19 DIAGNOSIS — M542 Cervicalgia: Secondary | ICD-10-CM | POA: Diagnosis not present

## 2017-03-01 DIAGNOSIS — E782 Mixed hyperlipidemia: Secondary | ICD-10-CM | POA: Diagnosis not present

## 2017-03-01 DIAGNOSIS — I10 Essential (primary) hypertension: Secondary | ICD-10-CM | POA: Diagnosis not present

## 2017-03-01 DIAGNOSIS — E119 Type 2 diabetes mellitus without complications: Secondary | ICD-10-CM | POA: Diagnosis not present

## 2017-03-01 DIAGNOSIS — I2510 Atherosclerotic heart disease of native coronary artery without angina pectoris: Secondary | ICD-10-CM | POA: Diagnosis not present

## 2017-03-04 ENCOUNTER — Other Ambulatory Visit: Payer: Self-pay | Admitting: Neurosurgery

## 2017-03-04 DIAGNOSIS — M542 Cervicalgia: Principal | ICD-10-CM

## 2017-03-04 DIAGNOSIS — R5382 Chronic fatigue, unspecified: Secondary | ICD-10-CM | POA: Insufficient documentation

## 2017-03-04 DIAGNOSIS — M545 Low back pain, unspecified: Secondary | ICD-10-CM | POA: Insufficient documentation

## 2017-03-04 DIAGNOSIS — R252 Cramp and spasm: Secondary | ICD-10-CM | POA: Insufficient documentation

## 2017-03-04 DIAGNOSIS — G8929 Other chronic pain: Secondary | ICD-10-CM

## 2017-03-04 DIAGNOSIS — R5383 Other fatigue: Secondary | ICD-10-CM | POA: Insufficient documentation

## 2017-03-04 DIAGNOSIS — F429 Obsessive-compulsive disorder, unspecified: Secondary | ICD-10-CM | POA: Insufficient documentation

## 2017-03-08 ENCOUNTER — Ambulatory Visit
Admission: RE | Admit: 2017-03-08 | Discharge: 2017-03-08 | Disposition: A | Discharge status: Home / Self Care | Payer: PPO | Source: Ambulatory Visit | Attending: Family Medicine | Admitting: Family Medicine

## 2017-03-08 DIAGNOSIS — Z1231 Encounter for screening mammogram for malignant neoplasm of breast: Secondary | ICD-10-CM

## 2017-03-17 ENCOUNTER — Ambulatory Visit
Admission: RE | Admit: 2017-03-17 | Discharge: 2017-03-17 | Disposition: A | Discharge status: Home / Self Care | Payer: PPO | Source: Ambulatory Visit | Attending: Neurosurgery | Admitting: Neurosurgery

## 2017-03-17 DIAGNOSIS — G8929 Other chronic pain: Secondary | ICD-10-CM

## 2017-03-17 DIAGNOSIS — M4313 Spondylolisthesis, cervicothoracic region: Secondary | ICD-10-CM | POA: Diagnosis not present

## 2017-03-17 DIAGNOSIS — M542 Cervicalgia: Principal | ICD-10-CM

## 2017-03-17 MED ORDER — HYDROXYZINE HCL 50 MG/ML IM SOLN
25.0000 mg | Freq: Once | INTRAMUSCULAR | Status: AC
  Administered 2017-03-17: 25 mg via INTRAMUSCULAR

## 2017-03-17 MED ORDER — HYDROMORPHONE HCL 1 MG/ML IJ SOLN
1.0000 mg | Freq: Once | INTRAMUSCULAR | Status: AC
  Administered 2017-03-17: 1 mg via INTRAMUSCULAR

## 2017-03-17 MED ORDER — DIAZEPAM 5 MG PO TABS
5.0000 mg | ORAL_TABLET | Freq: Once | ORAL | Status: AC
  Administered 2017-03-17: 10 mg via ORAL

## 2017-03-17 MED ORDER — IOPAMIDOL (ISOVUE-M 300) INJECTION 61%
10.0000 mL | Freq: Once | INTRAMUSCULAR | Status: AC | PRN
  Administered 2017-03-17: 10 mL via INTRATHECAL

## 2017-03-17 MED ORDER — HYDROMORPHONE HCL 2 MG PO TABS
2.0000 mg | ORAL_TABLET | Freq: Once | ORAL | Status: AC
  Administered 2017-03-17: 2 mg via ORAL

## 2017-03-17 MED ORDER — DIAZEPAM 5 MG PO TABS
5.0000 mg | ORAL_TABLET | Freq: Once | ORAL | Status: AC
  Administered 2017-03-17: 5 mg via ORAL

## 2017-03-17 NOTE — Discharge Instructions (Signed)

## 2017-04-16 DIAGNOSIS — H26493 Other secondary cataract, bilateral: Secondary | ICD-10-CM | POA: Diagnosis not present

## 2017-04-23 DIAGNOSIS — H524 Presbyopia: Secondary | ICD-10-CM | POA: Diagnosis not present

## 2017-05-05 DIAGNOSIS — H26493 Other secondary cataract, bilateral: Secondary | ICD-10-CM | POA: Diagnosis not present

## 2017-05-20 DIAGNOSIS — L0100 Impetigo, unspecified: Secondary | ICD-10-CM | POA: Diagnosis not present

## 2017-05-20 DIAGNOSIS — L820 Inflamed seborrheic keratosis: Secondary | ICD-10-CM | POA: Diagnosis not present

## 2017-06-03 DIAGNOSIS — H524 Presbyopia: Secondary | ICD-10-CM | POA: Diagnosis not present

## 2017-06-28 DIAGNOSIS — I10 Essential (primary) hypertension: Secondary | ICD-10-CM | POA: Diagnosis not present

## 2017-06-28 DIAGNOSIS — M4722 Other spondylosis with radiculopathy, cervical region: Secondary | ICD-10-CM | POA: Diagnosis not present

## 2017-06-28 DIAGNOSIS — Z6830 Body mass index (BMI) 30.0-30.9, adult: Secondary | ICD-10-CM | POA: Diagnosis not present

## 2017-06-28 DIAGNOSIS — M4312 Spondylolisthesis, cervical region: Secondary | ICD-10-CM | POA: Diagnosis not present

## 2017-06-30 ENCOUNTER — Other Ambulatory Visit: Payer: Self-pay | Admitting: Neurosurgery

## 2017-07-14 NOTE — Pre-Procedure Instructions (Signed)
Elaine Roach  07/14/2017      [place], [place] - [address] [place] [address] Phone: [phone] Fax: [phone]    Your procedure is scheduled on Feb. 4  Report to [facility] at 1045 A.M.  Call this number if you have problems the morning of surgery:  [phone]   Remember:  Do not eat food or drink liquids after midnight.  Take these medicines the morning of surgery with A SIP OF WATER Cetirizine (Zyrtec), omeprazole (Prilosec), ranitidine (Zantac)  Stop taking aspirin, BC's, Goody's, Herbal medications, Fish Oil, Ibuprofen, Advil, Motrin, Aleve, Vitamins    How to Manage Your Diabetes Before and After Surgery  Why is it important to control my blood sugar before and after surgery? . Improving blood sugar levels before and after surgery helps healing and can limit problems. . A way of improving blood sugar control is eating a healthy diet by: o  Eating less sugar and carbohydrates o  Increasing activity/exercise o  Talking with your doctor about reaching your blood sugar goals . High blood sugars (greater than 180 mg/dL) can raise your risk of infections and slow your recovery, so you will need to focus on controlling your diabetes during the weeks before surgery. . Make sure that the doctor who takes care of your diabetes knows about your planned surgery including the date and location.  How do I manage my blood sugar before surgery? . Check your blood sugar at least 4 times a day, starting 2 days before surgery, to make sure that the level is not too high or low. o Check your blood sugar the morning of your surgery when you wake up and every 2 hours until you get to the Short Stay unit. . If your blood sugar is less than 70 mg/dL, you will need to treat for low blood sugar: o Do not take insulin. o Treat a low blood sugar (less than 70 mg/dL) with  cup of clear juice (cranberry or  apple), 4 glucose tablets, OR glucose gel. Recheck blood sugar in 15 minutes after treatment (to make sure it is greater than 70 mg/dL). If your blood sugar is not greater than 70 mg/dL on recheck, call [phone] o  for further instructions. . Report your blood sugar to the short stay nurse when you get to Short Stay.  . If you are admitted to the hospital after surgery: o Your blood sugar will be checked by the staff and you will probably be given insulin after surgery (instead of oral diabetes medicines) to make sure you have good blood sugar levels. o The goal for blood sugar control after surgery is 80-180 mg/dL.              WHAT DO I DO ABOUT MY DIABETES MEDICATION?   Marland Kitchen Do not take oral diabetes medicines (pills) the morning of surgery. Metformin (Glucophage)   . THE NIGHT BEFORE SURGERY, take ___________ units of ___________insulin.       . THE MORNING OF SURGERY, take _____________ units of __________insulin.  . The day of surgery, do not take other diabetes injectables, including Byetta (exenatide), Bydureon (exenatide ER), Victoza (liraglutide), or Trulicity (dulaglutide).  . If your CBG is greater than 220 mg/dL, you may take  of your sliding scale (correction) dose of insulin.  Other Instructions:          Patient Signature:  Date:  Nurse Signature:  Date:   Reviewed and Endorsed by [facility] Patient Education Committee, August 2015  Do not wear jewelry, make-up or nail polish.  Do not wear lotions, powders, or perfumes, or deodorant.  Do not shave 48 hours prior to surgery.  Men may shave face and neck.  Do not bring valuables to the hospital.  [facility] is not responsible for any belongings or valuables.  Contacts, dentures or bridgework may not be worn into surgery.  Leave your suitcase in the car.  After surgery it may be brought to your room.  For patients admitted to the hospital, discharge time will be determined by your treatment  team.  Patients discharged the day of surgery will not be allowed to drive home.  Special instructions:  [facility] - Preparing for Surgery  Before surgery, you can play an important role.  Because skin is not sterile, your skin needs to be as free of germs as possible.  You can reduce the number of germs on you skin by washing with CHG (chlorahexidine gluconate) soap before surgery.  CHG is an antiseptic cleaner which kills germs and bonds with the skin to continue killing germs even after washing.  Please DO NOT use if you have an allergy to CHG or antibacterial soaps.  If your skin becomes reddened/irritated stop using the CHG and inform your nurse when you arrive at Short Stay.  Do not shave (including legs and underarms) for at least 48 hours prior to the first CHG shower.  You may shave your face.  Please follow these instructions carefully:   1.  Shower with CHG Soap the night before surgery and the   morning of Surgery.  2.  If you choose to wash your hair, wash your hair first as usual with your  normal shampoo.  3.  After you shampoo, rinse your hair and body thoroughly to remove the Shampoo.  4.  Use CHG as you would any other liquid soap.  You can apply chg directly   to the skin and wash gently with scrungie or a clean washcloth.  5.  Apply the CHG Soap to your body ONLY FROM THE NECK DOWN.  Do not use on open wounds or open sores.  Avoid contact with your eyes,ears, mouth and genitals (private parts).  Wash genitals (private parts) with your normal soap.  6.  Wash thoroughly, paying special attention to the area where your surgery  will be performed.  7.  Thoroughly rinse your body with warm water from the neck down.  8.  DO NOT shower/wash with your normal soap after using and rinsing off  the CHG Soap.  9.  Pat yourself dry with a clean towel.            10.  Wear clean pajamas.            11.  Place clean sheets on your bed the night of your first shower and do not  sleep  with pets.  Day of Surgery  Do not apply any lotions/deoderants the morning of surgery.  Please wear clean clothes to the hospital/surgery center.     Please read over the following fact sheets that you were given. Pain Booklet, Coughing and Deep Breathing, MRSA Information and Surgical Site Infection Prevention

## 2017-07-15 ENCOUNTER — Encounter (HOSPITAL_COMMUNITY): Payer: Self-pay

## 2017-07-15 ENCOUNTER — Ambulatory Visit (HOSPITAL_COMMUNITY)
Admission: RE | Admit: 2017-07-15 | Discharge: 2017-07-15 | Disposition: A | Discharge status: Home / Self Care | Payer: PPO | Source: Ambulatory Visit | Attending: Neurosurgery | Admitting: Neurosurgery

## 2017-07-15 DIAGNOSIS — M47022 Vertebral artery compression syndromes, cervical region: Secondary | ICD-10-CM | POA: Diagnosis not present

## 2017-07-15 DIAGNOSIS — M5013 Cervical disc disorder with radiculopathy, cervicothoracic region: Secondary | ICD-10-CM | POA: Diagnosis not present

## 2017-07-15 DIAGNOSIS — Z888 Allergy status to other drugs, medicaments and biological substances status: Secondary | ICD-10-CM | POA: Diagnosis not present

## 2017-07-15 DIAGNOSIS — F419 Anxiety disorder, unspecified: Secondary | ICD-10-CM | POA: Diagnosis not present

## 2017-07-15 DIAGNOSIS — F329 Major depressive disorder, single episode, unspecified: Secondary | ICD-10-CM | POA: Diagnosis not present

## 2017-07-15 DIAGNOSIS — M79603 Pain in arm, unspecified: Secondary | ICD-10-CM | POA: Diagnosis present

## 2017-07-15 DIAGNOSIS — M4313 Spondylolisthesis, cervicothoracic region: Secondary | ICD-10-CM | POA: Diagnosis not present

## 2017-07-15 DIAGNOSIS — M48062 Spinal stenosis, lumbar region with neurogenic claudication: Secondary | ICD-10-CM | POA: Diagnosis not present

## 2017-07-15 DIAGNOSIS — K7581 Nonalcoholic steatohepatitis (NASH): Secondary | ICD-10-CM | POA: Diagnosis not present

## 2017-07-15 DIAGNOSIS — I10 Essential (primary) hypertension: Secondary | ICD-10-CM | POA: Diagnosis not present

## 2017-07-15 DIAGNOSIS — E119 Type 2 diabetes mellitus without complications: Secondary | ICD-10-CM | POA: Diagnosis not present

## 2017-07-15 DIAGNOSIS — Z79899 Other long term (current) drug therapy: Secondary | ICD-10-CM | POA: Diagnosis not present

## 2017-07-15 DIAGNOSIS — Z87891 Personal history of nicotine dependence: Secondary | ICD-10-CM | POA: Diagnosis not present

## 2017-07-15 DIAGNOSIS — Z9049 Acquired absence of other specified parts of digestive tract: Secondary | ICD-10-CM | POA: Diagnosis not present

## 2017-07-15 DIAGNOSIS — Z7984 Long term (current) use of oral hypoglycemic drugs: Secondary | ICD-10-CM | POA: Diagnosis not present

## 2017-07-15 DIAGNOSIS — G8929 Other chronic pain: Secondary | ICD-10-CM | POA: Diagnosis not present

## 2017-07-15 DIAGNOSIS — Z803 Family history of malignant neoplasm of breast: Secondary | ICD-10-CM | POA: Diagnosis not present

## 2017-07-15 DIAGNOSIS — M4722 Other spondylosis with radiculopathy, cervical region: Secondary | ICD-10-CM | POA: Diagnosis not present

## 2017-07-15 DIAGNOSIS — K219 Gastro-esophageal reflux disease without esophagitis: Secondary | ICD-10-CM | POA: Diagnosis not present

## 2017-07-15 DIAGNOSIS — Z885 Allergy status to narcotic agent status: Secondary | ICD-10-CM | POA: Diagnosis not present

## 2017-07-15 DIAGNOSIS — M50123 Cervical disc disorder at C6-C7 level with radiculopathy: Secondary | ICD-10-CM | POA: Diagnosis not present

## 2017-07-15 DIAGNOSIS — M50323 Other cervical disc degeneration at C6-C7 level: Secondary | ICD-10-CM | POA: Diagnosis not present

## 2017-07-15 DIAGNOSIS — M4312 Spondylolisthesis, cervical region: Secondary | ICD-10-CM | POA: Diagnosis not present

## 2017-07-15 DIAGNOSIS — M4802 Spinal stenosis, cervical region: Secondary | ICD-10-CM | POA: Diagnosis not present

## 2017-07-15 DIAGNOSIS — M542 Cervicalgia: Secondary | ICD-10-CM | POA: Diagnosis present

## 2017-07-15 DIAGNOSIS — Z8701 Personal history of pneumonia (recurrent): Secondary | ICD-10-CM | POA: Diagnosis not present

## 2017-07-15 DIAGNOSIS — Z981 Arthrodesis status: Secondary | ICD-10-CM | POA: Diagnosis not present

## 2017-07-15 HISTORY — DX: Headache: R51

## 2017-07-15 HISTORY — DX: Insomnia, unspecified: G47.00

## 2017-07-15 HISTORY — DX: Pneumonia, unspecified organism: J18.9

## 2017-07-15 HISTORY — DX: Headache, unspecified: R51.9

## 2017-07-15 LAB — TYPE AND SCREEN
ABO/RH(D): A POS
Antibody Screen: NEGATIVE

## 2017-07-15 LAB — COMPREHENSIVE METABOLIC PANEL
ALT: 25 U/L (ref 14–54)
AST: 26 U/L (ref 15–41)
Albumin: 4.1 g/dL (ref 3.5–5.0)
Alkaline Phosphatase: 72 U/L (ref 38–126)
Anion gap: 12 (ref 5–15)
BUN: 16 mg/dL (ref 6–20)
CO2: 23 mmol/L (ref 22–32)
Calcium: 10 mg/dL (ref 8.9–10.3)
Chloride: 105 mmol/L (ref 101–111)
Creatinine, Ser: 0.87 mg/dL (ref 0.44–1.00)
GFR calc Af Amer: >60 mL/min (ref >60)
GFR calc non Af Amer: >60 mL/min (ref >60)
Glucose, Bld: 128 mg/dL — ABNORMAL HIGH (ref 65–99)
Potassium: 3.5 mmol/L (ref 3.5–5.1)
Sodium: 140 mmol/L (ref 135–145)
Total Bilirubin: 0.8 mg/dL (ref 0.3–1.2)
Total Protein: 6.9 g/dL (ref 6.5–8.1)

## 2017-07-15 LAB — CBC
HCT: 38.4 % (ref 36.0–46.0)
Hemoglobin: 12.4 g/dL (ref 12.0–15.0)
MCH: 27.3 pg (ref 26.0–34.0)
MCHC: 32.3 g/dL (ref 30.0–36.0)
MCV: 84.6 fL (ref 78.0–100.0)
Platelets: 275 10*3/uL (ref 150–400)
RBC: 4.54 MIL/uL (ref 3.87–5.11)
RDW: 14.9 % (ref 11.5–15.5)
WBC: 6.1 10*3/uL (ref 4.0–10.5)

## 2017-07-15 LAB — GLUCOSE, CAPILLARY: Glucose-Capillary: 179 mg/dL — ABNORMAL HIGH (ref 65–99)

## 2017-07-15 LAB — HEMOGLOBIN A1C
Hgb A1c MFr Bld: 6.2 % — ABNORMAL HIGH (ref 4.8–5.6)
Mean Plasma Glucose: 131.24 mg/dL

## 2017-07-15 LAB — SURGICAL PCR SCREEN
MRSA, PCR: NEGATIVE
Staphylococcus aureus: NEGATIVE

## 2017-07-15 NOTE — Progress Notes (Signed)
PCP: Dr. [facility] Cardiologist: denies  Fasting sugars [phone]

## 2017-07-15 NOTE — Pre-Procedure Instructions (Signed)
Elaine Roach  07/15/2017      [place], [place] - [address] [place] [address] Phone: [phone] Fax: [phone]    Your procedure is scheduled on Feb. 4  Report to [facility] at 1045 A.M.  Call this number if you have problems the morning of surgery:  [phone]   Remember:  Do not eat food or drink liquids after midnight.  Take these medicines the morning of surgery with A SIP OF WATER Cetirizine (Zyrtec), omeprazole (Prilosec), ranitidine (Zantac)  Stop taking aspirin, BC's, Goody's, Herbal medications, Fish Oil, Ibuprofen, Advil, Motrin, Aleve, Vitamins    How to Manage Your Diabetes Before and After Surgery  Why is it important to control my blood sugar before and after surgery? . Improving blood sugar levels before and after surgery helps healing and can limit problems. . A way of improving blood sugar control is eating a healthy diet by: o  Eating less sugar and carbohydrates o  Increasing activity/exercise o  Talking with your doctor about reaching your blood sugar goals . High blood sugars (greater than 180 mg/dL) can raise your risk of infections and slow your recovery, so you will need to focus on controlling your diabetes during the weeks before surgery. . Make sure that the doctor who takes care of your diabetes knows about your planned surgery including the date and location.  How do I manage my blood sugar before surgery? . Check your blood sugar at least 4 times a day, starting 2 days before surgery, to make sure that the level is not too high or low. o Check your blood sugar the morning of your surgery when you wake up and every 2 hours until you get to the Short Stay unit. . If your blood sugar is less than 70 mg/dL, you will need to treat for low blood sugar: o Do not take insulin. o Treat a low blood sugar (less than 70 mg/dL) with  cup of clear juice (cranberry or  apple), 4 glucose tablets, OR glucose gel. Recheck blood sugar in 15 minutes after treatment (to make sure it is greater than 70 mg/dL). If your blood sugar is not greater than 70 mg/dL on recheck, call [phone] o  for further instructions. . Report your blood sugar to the short stay nurse when you get to Short Stay.  . If you are admitted to the hospital after surgery: o Your blood sugar will be checked by the staff and you will probably be given insulin after surgery (instead of oral diabetes medicines) to make sure you have good blood sugar levels. o The goal for blood sugar control after surgery is 80-180 mg/dL.      WHAT DO I DO ABOUT MY DIABETES MEDICATION?   Marland Kitchen Do not take oral diabetes medicines (pills) the morning of surgery. Metformin (Glucophage)       Do not wear jewelry, make-up or nail polish.  Do not wear lotions, powders, or perfumes, or deodorant.  Do not shave 48 hours prior to surgery.  Men may shave face and neck.  Do not bring valuables to the hospital.  [facility] is not responsible for any belongings or valuables.  Contacts, dentures or bridgework may not be worn into surgery.  Leave your suitcase in the car.  After surgery it may be brought to your room.  For patients admitted to the hospital, discharge time will  be determined by your treatment team.  Patients discharged the day of surgery will not be allowed to drive home.  Special instructions:  [name] - Preparing for Surgery  Before surgery, you can play an important role.  Because skin is not sterile, your skin needs to be as free of germs as possible.  You can reduce the number of germs on you skin by washing with CHG (chlorahexidine gluconate) soap before surgery.  CHG is an antiseptic cleaner which kills germs and bonds with the skin to continue killing germs even after washing.  Please DO NOT use if you have an allergy to CHG or antibacterial soaps.  If your skin becomes reddened/irritated  stop using the CHG and inform your nurse when you arrive at Short Stay.  Do not shave (including legs and underarms) for at least 48 hours prior to the first CHG shower.  You may shave your face.  Please follow these instructions carefully:   1.  Shower with CHG Soap the night before surgery and the   morning of Surgery.  2.  If you choose to wash your hair, wash your hair first as usual with your  normal shampoo.  3.  After you shampoo, rinse your hair and body thoroughly to remove the Shampoo.  4.  Use CHG as you would any other liquid soap.  You can apply chg directly   to the skin and wash gently with scrungie or a clean washcloth.  5.  Apply the CHG Soap to your body ONLY FROM THE NECK DOWN.  Do not use on open wounds or open sores.  Avoid contact with your eyes,ears, mouth and genitals (private parts).  Wash genitals (private parts) with your normal soap.  6.  Wash thoroughly, paying special attention to the area where your surgery  will be performed.  7.  Thoroughly rinse your body with warm water from the neck down.  8.  DO NOT shower/wash with your normal soap after using and rinsing off  the CHG Soap.  9.  Pat yourself dry with a clean towel.            10.  Wear clean pajamas.            11.  Place clean sheets on your bed the night of your first shower and do not  sleep with pets.  Day of Surgery  Do not apply any lotions/deoderants the morning of surgery.  Please wear clean clothes to the hospital/surgery center.     Please read over the following fact sheets that you were given. Pain Booklet, Coughing and Deep Breathing, MRSA Information and Surgical Site Infection Prevention

## 2017-07-19 ENCOUNTER — Ambulatory Visit (HOSPITAL_COMMUNITY): Payer: PPO | Admitting: Certified Registered"

## 2017-07-19 ENCOUNTER — Ambulatory Visit (HOSPITAL_COMMUNITY): Payer: PPO

## 2017-07-19 ENCOUNTER — Inpatient Hospital Stay (HOSPITAL_COMMUNITY)
Admission: RE | Admit: 2017-07-19 | Discharge: 2017-07-20 | DRG: 473 | Disposition: A | Discharge status: Home / Self Care | Payer: PPO | Source: Ambulatory Visit | Attending: Neurosurgery | Admitting: Neurosurgery

## 2017-07-19 ENCOUNTER — Encounter (HOSPITAL_COMMUNITY)
Admission: RE | Disposition: A | Discharge status: Home / Self Care | Payer: Self-pay | Source: Ambulatory Visit | Attending: Neurosurgery

## 2017-07-19 ENCOUNTER — Encounter (HOSPITAL_COMMUNITY): Payer: Self-pay | Admitting: *Deleted

## 2017-07-19 DIAGNOSIS — M50123 Cervical disc disorder at C6-C7 level with radiculopathy: Principal | ICD-10-CM | POA: Diagnosis present

## 2017-07-19 DIAGNOSIS — Z981 Arthrodesis status: Secondary | ICD-10-CM

## 2017-07-19 DIAGNOSIS — E119 Type 2 diabetes mellitus without complications: Secondary | ICD-10-CM | POA: Diagnosis not present

## 2017-07-19 DIAGNOSIS — F329 Major depressive disorder, single episode, unspecified: Secondary | ICD-10-CM | POA: Diagnosis present

## 2017-07-19 DIAGNOSIS — M4313 Spondylolisthesis, cervicothoracic region: Secondary | ICD-10-CM | POA: Diagnosis present

## 2017-07-19 DIAGNOSIS — Z888 Allergy status to other drugs, medicaments and biological substances status: Secondary | ICD-10-CM

## 2017-07-19 DIAGNOSIS — M4802 Spinal stenosis, cervical region: Secondary | ICD-10-CM

## 2017-07-19 DIAGNOSIS — M5013 Cervical disc disorder with radiculopathy, cervicothoracic region: Secondary | ICD-10-CM | POA: Diagnosis present

## 2017-07-19 DIAGNOSIS — G8929 Other chronic pain: Secondary | ICD-10-CM | POA: Diagnosis present

## 2017-07-19 DIAGNOSIS — Z7984 Long term (current) use of oral hypoglycemic drugs: Secondary | ICD-10-CM

## 2017-07-19 DIAGNOSIS — Z8701 Personal history of pneumonia (recurrent): Secondary | ICD-10-CM

## 2017-07-19 DIAGNOSIS — Z9049 Acquired absence of other specified parts of digestive tract: Secondary | ICD-10-CM

## 2017-07-19 DIAGNOSIS — Z885 Allergy status to narcotic agent status: Secondary | ICD-10-CM

## 2017-07-19 DIAGNOSIS — M79603 Pain in arm, unspecified: Secondary | ICD-10-CM | POA: Diagnosis present

## 2017-07-19 DIAGNOSIS — K219 Gastro-esophageal reflux disease without esophagitis: Secondary | ICD-10-CM | POA: Diagnosis present

## 2017-07-19 DIAGNOSIS — Z803 Family history of malignant neoplasm of breast: Secondary | ICD-10-CM

## 2017-07-19 DIAGNOSIS — I10 Essential (primary) hypertension: Secondary | ICD-10-CM | POA: Diagnosis present

## 2017-07-19 DIAGNOSIS — Z79899 Other long term (current) drug therapy: Secondary | ICD-10-CM

## 2017-07-19 DIAGNOSIS — K7581 Nonalcoholic steatohepatitis (NASH): Secondary | ICD-10-CM | POA: Diagnosis present

## 2017-07-19 DIAGNOSIS — F419 Anxiety disorder, unspecified: Secondary | ICD-10-CM | POA: Diagnosis present

## 2017-07-19 DIAGNOSIS — Z87891 Personal history of nicotine dependence: Secondary | ICD-10-CM

## 2017-07-19 DIAGNOSIS — M48062 Spinal stenosis, lumbar region with neurogenic claudication: Secondary | ICD-10-CM | POA: Diagnosis not present

## 2017-07-19 DIAGNOSIS — M4722 Other spondylosis with radiculopathy, cervical region: Secondary | ICD-10-CM | POA: Diagnosis present

## 2017-07-19 HISTORY — PX: ANTERIOR CERVICAL DECOMP/DISCECTOMY FUSION: SHX1161

## 2017-07-19 LAB — GLUCOSE, CAPILLARY
Glucose-Capillary: 109 mg/dL — ABNORMAL HIGH (ref 65–99)
Glucose-Capillary: 162 mg/dL — ABNORMAL HIGH (ref 65–99)
Glucose-Capillary: 243 mg/dL — ABNORMAL HIGH (ref 65–99)

## 2017-07-19 SURGERY — ANTERIOR CERVICAL DECOMPRESSION/DISCECTOMY FUSION 2 LEVELS
Anesthesia: General | Site: Spine Cervical

## 2017-07-19 MED ORDER — IRBESARTAN-HYDROCHLOROTHIAZIDE 300-12.5 MG PO TABS: 1.0000 | ORAL_TABLET | Freq: Every day | ORAL | Status: DC

## 2017-07-19 MED ORDER — METFORMIN HCL 500 MG PO TABS
500.0000 mg | ORAL_TABLET | Freq: Two times a day (BID) | ORAL | Status: DC
  Administered 2017-07-19 – 2017-07-20 (×2): 500 mg via ORAL
  Filled 2017-07-19 (×2): qty 1

## 2017-07-19 MED ORDER — INSULIN ASPART 100 UNIT/ML ~~LOC~~ SOLN
0.0000 [IU] | Freq: Every day | SUBCUTANEOUS | Status: DC
  Administered 2017-07-19: 2 [IU] via SUBCUTANEOUS

## 2017-07-19 MED ORDER — FENTANYL CITRATE (PF) 100 MCG/2ML IJ SOLN
INTRAMUSCULAR | Status: DC | PRN
  Administered 2017-07-19: 100 ug via INTRAVENOUS
  Administered 2017-07-19 (×3): 50 ug via INTRAVENOUS

## 2017-07-19 MED ORDER — HYDROCHLOROTHIAZIDE 12.5 MG PO CAPS
12.5000 mg | ORAL_CAPSULE | Freq: Every day | ORAL | Status: DC
  Filled 2017-07-19: qty 1

## 2017-07-19 MED ORDER — DEXAMETHASONE SODIUM PHOSPHATE 10 MG/ML IJ SOLN
INTRAMUSCULAR | Status: AC
  Filled 2017-07-19: qty 1

## 2017-07-19 MED ORDER — IRBESARTAN 300 MG PO TABS
300.0000 mg | ORAL_TABLET | Freq: Every day | ORAL | Status: DC
  Filled 2017-07-19: qty 1

## 2017-07-19 MED ORDER — ALUM & MAG HYDROXIDE-SIMETH 200-200-20 MG/5ML PO SUSP: 30.0000 mL | Freq: Four times a day (QID) | ORAL | Status: DC | PRN

## 2017-07-19 MED ORDER — LACTATED RINGERS IV SOLN: INTRAVENOUS | Status: DC

## 2017-07-19 MED ORDER — PROMETHAZINE HCL 25 MG/ML IJ SOLN: 6.2500 mg | INTRAMUSCULAR | Status: DC | PRN

## 2017-07-19 MED ORDER — HYDROMORPHONE HCL 2 MG PO TABS
4.0000 mg | ORAL_TABLET | ORAL | Status: DC | PRN
  Administered 2017-07-19 – 2017-07-20 (×2): 4 mg via ORAL
  Filled 2017-07-19 (×2): qty 2

## 2017-07-19 MED ORDER — CHLORHEXIDINE GLUCONATE CLOTH 2 % EX PADS: 6.0000 | MEDICATED_PAD | Freq: Once | CUTANEOUS | Status: DC

## 2017-07-19 MED ORDER — LORATADINE 10 MG PO TABS
10.0000 mg | ORAL_TABLET | Freq: Every day | ORAL | Status: DC
  Filled 2017-07-19: qty 1

## 2017-07-19 MED ORDER — ONDANSETRON HCL 4 MG/2ML IJ SOLN
INTRAMUSCULAR | Status: DC | PRN
  Administered 2017-07-19: 4 mg via INTRAVENOUS

## 2017-07-19 MED ORDER — ROCURONIUM BROMIDE 10 MG/ML (PF) SYRINGE
PREFILLED_SYRINGE | INTRAVENOUS | Status: AC
  Filled 2017-07-19: qty 5

## 2017-07-19 MED ORDER — LIDOCAINE 2% (20 MG/ML) 5 ML SYRINGE
INTRAMUSCULAR | Status: DC | PRN
  Administered 2017-07-19: 100 mg via INTRAVENOUS

## 2017-07-19 MED ORDER — DOCUSATE SODIUM 100 MG PO CAPS
100.0000 mg | ORAL_CAPSULE | Freq: Two times a day (BID) | ORAL | Status: DC
  Administered 2017-07-19: 100 mg via ORAL
  Filled 2017-07-19 (×2): qty 1

## 2017-07-19 MED ORDER — SODIUM CHLORIDE 0.9 % IR SOLN
Status: DC | PRN
  Administered 2017-07-19: 500 mL

## 2017-07-19 MED ORDER — POLYMYXIN B-TRIMETHOPRIM 10000-0.1 UNIT/ML-% OP SOLN
1.0000 [drp] | Freq: Three times a day (TID) | OPHTHALMIC | Status: DC
  Filled 2017-07-19: qty 10

## 2017-07-19 MED ORDER — HYDROMORPHONE HCL 1 MG/ML IJ SOLN
0.2500 mg | INTRAMUSCULAR | Status: DC | PRN
  Administered 2017-07-19 (×3): 0.5 mg via INTRAVENOUS

## 2017-07-19 MED ORDER — ACETAMINOPHEN 325 MG PO TABS
650.0000 mg | ORAL_TABLET | ORAL | Status: DC | PRN
Start: 2017-07-19 — End: 2017-07-20

## 2017-07-19 MED ORDER — KETOROLAC TROMETHAMINE 0.5 % OP SOLN
1.0000 [drp] | Freq: Four times a day (QID) | OPHTHALMIC | Status: DC | PRN
  Filled 2017-07-19: qty 3

## 2017-07-19 MED ORDER — ZOLPIDEM TARTRATE 5 MG PO TABS
5.0000 mg | ORAL_TABLET | Freq: Every evening | ORAL | Status: DC | PRN
  Administered 2017-07-19: 5 mg via ORAL
  Filled 2017-07-19: qty 1

## 2017-07-19 MED ORDER — HYDROMORPHONE HCL 1 MG/ML IJ SOLN
INTRAMUSCULAR | Status: AC
  Administered 2017-07-19: 0.5 mg via INTRAVENOUS
  Filled 2017-07-19: qty 1

## 2017-07-19 MED ORDER — MIDAZOLAM HCL 2 MG/2ML IJ SOLN
INTRAMUSCULAR | Status: AC
  Filled 2017-07-19: qty 2

## 2017-07-19 MED ORDER — BSS PLUS IO SOLN
Freq: Once | INTRAOCULAR | Status: DC
  Filled 2017-07-19: qty 500

## 2017-07-19 MED ORDER — BACITRACIN ZINC 500 UNIT/GM EX OINT
TOPICAL_OINTMENT | CUTANEOUS | Status: AC
  Filled 2017-07-19: qty 28.35

## 2017-07-19 MED ORDER — ONDANSETRON HCL 4 MG/2ML IJ SOLN
INTRAMUSCULAR | Status: AC
  Filled 2017-07-19: qty 2

## 2017-07-19 MED ORDER — THROMBIN (RECOMBINANT) 5000 UNITS EX SOLR
OROMUCOSAL | Status: DC | PRN
  Administered 2017-07-19: 5 mL via TOPICAL

## 2017-07-19 MED ORDER — ACETAMINOPHEN 650 MG RE SUPP: 650.0000 mg | RECTAL | Status: DC | PRN

## 2017-07-19 MED ORDER — INSULIN ASPART 100 UNIT/ML ~~LOC~~ SOLN: 0.0000 [IU] | Freq: Three times a day (TID) | SUBCUTANEOUS | Status: DC

## 2017-07-19 MED ORDER — ONDANSETRON HCL 4 MG/2ML IJ SOLN: 4.0000 mg | Freq: Four times a day (QID) | INTRAMUSCULAR | Status: DC | PRN

## 2017-07-19 MED ORDER — MENTHOL 3 MG MT LOZG: 1.0000 | LOZENGE | OROMUCOSAL | Status: DC | PRN

## 2017-07-19 MED ORDER — THROMBIN 5000 UNITS EX SOLR
CUTANEOUS | Status: AC
  Filled 2017-07-19: qty 5000

## 2017-07-19 MED ORDER — DEXAMETHASONE SODIUM PHOSPHATE 4 MG/ML IJ SOLN: 4.0000 mg | Freq: Four times a day (QID) | INTRAMUSCULAR | Status: AC

## 2017-07-19 MED ORDER — SCOPOLAMINE 1 MG/3DAYS TD PT72
MEDICATED_PATCH | TRANSDERMAL | Status: AC
  Administered 2017-07-19: 1.5 mg via TRANSDERMAL
  Filled 2017-07-19: qty 1

## 2017-07-19 MED ORDER — CEFAZOLIN SODIUM-DEXTROSE 2-4 GM/100ML-% IV SOLN
2.0000 g | Freq: Three times a day (TID) | INTRAVENOUS | Status: AC
  Administered 2017-07-19: 2 g via INTRAVENOUS
  Filled 2017-07-19: qty 100

## 2017-07-19 MED ORDER — ONDANSETRON HCL 4 MG PO TABS: 4.0000 mg | ORAL_TABLET | Freq: Four times a day (QID) | ORAL | Status: DC | PRN

## 2017-07-19 MED ORDER — FENTANYL CITRATE (PF) 250 MCG/5ML IJ SOLN
INTRAMUSCULAR | Status: AC
  Filled 2017-07-19: qty 5

## 2017-07-19 MED ORDER — SUGAMMADEX SODIUM 200 MG/2ML IV SOLN
INTRAVENOUS | Status: DC | PRN
  Administered 2017-07-19: 200 mg via INTRAVENOUS

## 2017-07-19 MED ORDER — PHENOL 1.4 % MT LIQD: 1.0000 | OROMUCOSAL | Status: DC | PRN

## 2017-07-19 MED ORDER — ATORVASTATIN CALCIUM 40 MG PO TABS
40.0000 mg | ORAL_TABLET | Freq: Every day | ORAL | Status: DC
  Administered 2017-07-19: 40 mg via ORAL
  Filled 2017-07-19: qty 1
  Filled 2017-07-19: qty 2

## 2017-07-19 MED ORDER — SCOPOLAMINE 1 MG/3DAYS TD PT72
1.0000 | MEDICATED_PATCH | TRANSDERMAL | Status: DC
  Administered 2017-07-19: 1.5 mg via TRANSDERMAL

## 2017-07-19 MED ORDER — BUPIVACAINE-EPINEPHRINE (PF) 0.5% -1:200000 IJ SOLN
INTRAMUSCULAR | Status: AC
  Filled 2017-07-19: qty 30

## 2017-07-19 MED ORDER — SERTRALINE HCL 100 MG PO TABS
100.0000 mg | ORAL_TABLET | Freq: Every day | ORAL | Status: DC
  Administered 2017-07-19: 100 mg via ORAL
  Filled 2017-07-19: qty 2
  Filled 2017-07-19: qty 1

## 2017-07-19 MED ORDER — BACITRACIN ZINC 500 UNIT/GM EX OINT
TOPICAL_OINTMENT | CUTANEOUS | Status: DC | PRN
  Administered 2017-07-19: 1 via TOPICAL

## 2017-07-19 MED ORDER — MIDAZOLAM HCL 2 MG/2ML IJ SOLN
INTRAMUSCULAR | Status: DC | PRN
  Administered 2017-07-19: 2 mg via INTRAVENOUS

## 2017-07-19 MED ORDER — BUPIVACAINE-EPINEPHRINE (PF) 0.5% -1:200000 IJ SOLN
INTRAMUSCULAR | Status: DC | PRN
  Administered 2017-07-19: 10 mL

## 2017-07-19 MED ORDER — PANTOPRAZOLE SODIUM 40 MG PO TBEC: 40.0000 mg | DELAYED_RELEASE_TABLET | Freq: Every day | ORAL | Status: DC

## 2017-07-19 MED ORDER — PANTOPRAZOLE SODIUM 40 MG IV SOLR: 40.0000 mg | Freq: Every day | INTRAVENOUS | Status: DC

## 2017-07-19 MED ORDER — PROPOFOL 10 MG/ML IV BOLUS
INTRAVENOUS | Status: AC
  Filled 2017-07-19: qty 20

## 2017-07-19 MED ORDER — BSS IO SOLN
15.0000 mL | Freq: Once | INTRAOCULAR | Status: AC
  Administered 2017-07-19: 15 mL
  Filled 2017-07-19: qty 15

## 2017-07-19 MED ORDER — FENTANYL CITRATE (PF) 100 MCG/2ML IJ SOLN
100.0000 ug | Freq: Once | INTRAMUSCULAR | Status: AC
  Administered 2017-07-19: 100 ug via INTRAVENOUS
  Filled 2017-07-19: qty 2

## 2017-07-19 MED ORDER — DEXAMETHASONE SODIUM PHOSPHATE 10 MG/ML IJ SOLN
INTRAMUSCULAR | Status: DC | PRN
  Administered 2017-07-19: 5 mg via INTRAVENOUS

## 2017-07-19 MED ORDER — KETOROLAC TROMETHAMINE 0.5 % OP SOLN
1.0000 [drp] | Freq: Three times a day (TID) | OPHTHALMIC | Status: DC | PRN
  Administered 2017-07-19: 1 [drp] via OPHTHALMIC

## 2017-07-19 MED ORDER — HYDROMORPHONE HCL 1 MG/ML IJ SOLN
INTRAMUSCULAR | Status: AC
  Filled 2017-07-19: qty 1

## 2017-07-19 MED ORDER — LIDOCAINE 2% (20 MG/ML) 5 ML SYRINGE
INTRAMUSCULAR | Status: AC
  Filled 2017-07-19: qty 5

## 2017-07-19 MED ORDER — DEXAMETHASONE 4 MG PO TABS
4.0000 mg | ORAL_TABLET | Freq: Four times a day (QID) | ORAL | Status: AC
  Administered 2017-07-19 – 2017-07-20 (×2): 4 mg via ORAL
  Filled 2017-07-19 (×2): qty 1

## 2017-07-19 MED ORDER — FENTANYL CITRATE (PF) 100 MCG/2ML IJ SOLN
INTRAMUSCULAR | Status: AC
  Administered 2017-07-19: 100 ug via INTRAVENOUS
  Filled 2017-07-19: qty 2

## 2017-07-19 MED ORDER — ROCURONIUM BROMIDE 10 MG/ML (PF) SYRINGE
PREFILLED_SYRINGE | INTRAVENOUS | Status: DC | PRN
  Administered 2017-07-19: 70 mg via INTRAVENOUS
  Administered 2017-07-19 (×3): 10 mg via INTRAVENOUS

## 2017-07-19 MED ORDER — PROPOFOL 10 MG/ML IV BOLUS
INTRAVENOUS | Status: DC | PRN
  Administered 2017-07-19: 160 mg via INTRAVENOUS

## 2017-07-19 MED ORDER — SUGAMMADEX SODIUM 200 MG/2ML IV SOLN
INTRAVENOUS | Status: AC
  Filled 2017-07-19: qty 2

## 2017-07-19 MED ORDER — BISACODYL 10 MG RE SUPP: 10.0000 mg | Freq: Every day | RECTAL | Status: DC | PRN

## 2017-07-19 MED ORDER — 0.9 % SODIUM CHLORIDE (POUR BTL) OPTIME
TOPICAL | Status: DC | PRN
  Administered 2017-07-19: 1000 mL

## 2017-07-19 MED ORDER — CEFAZOLIN SODIUM-DEXTROSE 2-4 GM/100ML-% IV SOLN
INTRAVENOUS | Status: AC
  Filled 2017-07-19: qty 100

## 2017-07-19 MED ORDER — VITAMIN D 1000 UNITS PO TABS
5000.0000 [IU] | ORAL_TABLET | Freq: Every day | ORAL | Status: DC
  Filled 2017-07-19 (×2): qty 5

## 2017-07-19 MED ORDER — PHENYLEPHRINE HCL 10 MG/ML IJ SOLN
INTRAVENOUS | Status: DC | PRN
  Administered 2017-07-19: 10 ug/min via INTRAVENOUS

## 2017-07-19 MED ORDER — PANTOPRAZOLE SODIUM 40 MG PO TBEC
80.0000 mg | DELAYED_RELEASE_TABLET | Freq: Every day | ORAL | Status: DC
  Filled 2017-07-19: qty 2

## 2017-07-19 MED ORDER — LACTATED RINGERS IV SOLN
INTRAVENOUS | Status: DC
  Administered 2017-07-19 (×2): via INTRAVENOUS

## 2017-07-19 MED ORDER — ACETAMINOPHEN 500 MG PO TABS
1000.0000 mg | ORAL_TABLET | Freq: Four times a day (QID) | ORAL | Status: DC
  Administered 2017-07-19 – 2017-07-20 (×2): 1000 mg via ORAL
  Filled 2017-07-19 (×3): qty 2

## 2017-07-19 MED ORDER — CEFAZOLIN SODIUM-DEXTROSE 2-4 GM/100ML-% IV SOLN
2.0000 g | INTRAVENOUS | Status: AC
  Administered 2017-07-19: 2 g via INTRAVENOUS

## 2017-07-19 MED ORDER — CYCLOBENZAPRINE HCL 10 MG PO TABS
10.0000 mg | ORAL_TABLET | Freq: Three times a day (TID) | ORAL | Status: DC | PRN
  Administered 2017-07-19: 10 mg via ORAL
  Filled 2017-07-19: qty 1

## 2017-07-19 SURGICAL SUPPLY — 66 items
BAG DECANTER FOR FLEXI CONT (MISCELLANEOUS) ×2 IMPLANT
BENZOIN TINCTURE PRP APPL 2/3 (GAUZE/BANDAGES/DRESSINGS) ×2 IMPLANT
BIT DRILL NEURO 2X3.1 SFT TUCH (MISCELLANEOUS) ×1 IMPLANT
BLADE SURG 15 STRL LF DISP TIS (BLADE) ×1 IMPLANT
BLADE SURG 15 STRL SS (BLADE) ×1
BLADE ULTRA TIP 2M (BLADE) ×2 IMPLANT
BUR BARREL STRAIGHT FLUTE 4.0 (BURR) ×2 IMPLANT
BUR MATCHSTICK NEURO 3.0 LAGG (BURR) ×2 IMPLANT
CAGE PEEK VISTAS 11X14X6 (Cage) ×4 IMPLANT
CANISTER SUCT 3000ML PPV (MISCELLANEOUS) ×2 IMPLANT
CARTRIDGE OIL MAESTRO DRILL (MISCELLANEOUS) ×1 IMPLANT
COVER MAYO STAND STRL (DRAPES) ×4 IMPLANT
DECANTER SPIKE VIAL GLASS SM (MISCELLANEOUS) ×2 IMPLANT
DIFFUSER DRILL AIR PNEUMATIC (MISCELLANEOUS) ×2 IMPLANT
DRAIN JACKSON PRATT 10MM FLAT (MISCELLANEOUS) ×2 IMPLANT
DRAPE LAPAROTOMY 100X72 PEDS (DRAPES) ×2 IMPLANT
DRAPE MICROSCOPE LEICA (MISCELLANEOUS) ×2 IMPLANT
DRAPE POUCH INSTRU U-SHP 10X18 (DRAPES) ×2 IMPLANT
DRAPE SURG 17X23 STRL (DRAPES) ×4 IMPLANT
DRILL NEURO 2X3.1 SOFT TOUCH (MISCELLANEOUS) ×2
ELECT REM PT RETURN 9FT ADLT (ELECTROSURGICAL) ×2
ELECTRODE REM PT RTRN 9FT ADLT (ELECTROSURGICAL) ×1 IMPLANT
EVACUATOR SILICONE 100CC (DRAIN) ×2 IMPLANT
GAUZE SPONGE 4X4 12PLY STRL (GAUZE/BANDAGES/DRESSINGS) IMPLANT
GAUZE SPONGE 4X4 12PLY STRL LF (GAUZE/BANDAGES/DRESSINGS) ×2 IMPLANT
GAUZE SPONGE 4X4 16PLY XRAY LF (GAUZE/BANDAGES/DRESSINGS) IMPLANT
GLOVE BIO SURGEON STRL SZ8 (GLOVE) ×2 IMPLANT
GLOVE BIO SURGEON STRL SZ8.5 (GLOVE) ×2 IMPLANT
GLOVE BIOGEL PI IND STRL 6.5 (GLOVE) ×2 IMPLANT
GLOVE BIOGEL PI IND STRL 8 (GLOVE) ×1 IMPLANT
GLOVE BIOGEL PI INDICATOR 6.5 (GLOVE) ×2
GLOVE BIOGEL PI INDICATOR 8 (GLOVE) ×1
GLOVE ECLIPSE 8.0 STRL XLNG CF (GLOVE) ×6 IMPLANT
GLOVE EXAM NITRILE LRG STRL (GLOVE) IMPLANT
GLOVE EXAM NITRILE XL STR (GLOVE) IMPLANT
GLOVE EXAM NITRILE XS STR PU (GLOVE) IMPLANT
GLOVE SURG SS PI 6.5 STRL IVOR (GLOVE) ×6 IMPLANT
GOWN STRL REUS W/ TWL LRG LVL3 (GOWN DISPOSABLE) IMPLANT
GOWN STRL REUS W/ TWL XL LVL3 (GOWN DISPOSABLE) ×1 IMPLANT
GOWN STRL REUS W/TWL LRG LVL3 (GOWN DISPOSABLE) ×2 IMPLANT
GOWN STRL REUS W/TWL XL LVL3 (GOWN DISPOSABLE) ×1
HEMOSTAT POWDER KIT SURGIFOAM (HEMOSTASIS) ×2 IMPLANT
KIT BASIN OR (CUSTOM PROCEDURE TRAY) ×2 IMPLANT
KIT ROOM TURNOVER OR (KITS) ×2 IMPLANT
MARKER SKIN DUAL TIP RULER LAB (MISCELLANEOUS) ×2 IMPLANT
NEEDLE HYPO 22GX1.5 SAFETY (NEEDLE) ×2 IMPLANT
NEEDLE SPNL 18GX3.5 QUINCKE PK (NEEDLE) ×2 IMPLANT
NS IRRIG 1000ML POUR BTL (IV SOLUTION) ×2 IMPLANT
OIL CARTRIDGE MAESTRO DRILL (MISCELLANEOUS) ×2
PACK LAMINECTOMY NEURO (CUSTOM PROCEDURE TRAY) ×2 IMPLANT
PATTIES SURGICAL 1X1 (DISPOSABLE) ×2 IMPLANT
PIN DISTRACTION 14MM (PIN) ×4 IMPLANT
PLATE ANT CERV XTEND 2 LV 30 (Plate) ×2 IMPLANT
PUTTY KINEX BIOACTIVE 5CC (Bone Implant) ×2 IMPLANT
RUBBERBAND STERILE (MISCELLANEOUS) ×4 IMPLANT
SCREW XTD VAR 4.2 SELF TAP 12 (Screw) ×12 IMPLANT
SPONGE INTESTINAL PEANUT (DISPOSABLE) ×4 IMPLANT
SPONGE SURGIFOAM ABS GEL SZ50 (HEMOSTASIS) IMPLANT
STRIP CLOSURE SKIN 1/2X4 (GAUZE/BANDAGES/DRESSINGS) ×2 IMPLANT
SUT VIC AB 0 CT1 27 (SUTURE) ×1
SUT VIC AB 0 CT1 27XBRD ANTBC (SUTURE) ×1 IMPLANT
SUT VIC AB 3-0 SH 8-18 (SUTURE) ×2 IMPLANT
TAPE CLOTH SURG 4X10 WHT LF (GAUZE/BANDAGES/DRESSINGS) ×2 IMPLANT
TOWEL GREEN STERILE (TOWEL DISPOSABLE) ×2 IMPLANT
TOWEL GREEN STERILE FF (TOWEL DISPOSABLE) ×2 IMPLANT
WATER STERILE IRR 1000ML POUR (IV SOLUTION) ×2 IMPLANT

## 2017-07-19 NOTE — H&P (Signed)
Subjective:  The patient is a 67 year old white female who has had chronic neck and back pain.  She has had a C4-5 and C5-6 anterior cervical discectomy, fusion and plating in 2016.  This was complicated by a postoperative cervical hematoma which was evacuated.  She has had worsening neck pain.  She was worked up with a cervical myelo CT.  This demonstrated disc degeneration, spondylosis, etc. at C6-7 and C7-T1.  I discussed the various treatment options with the patient including surgery.  She has weighed the risks, benefits, and alternatives of surgery and decided proceed with an exploration of her cervical fusion with a C6-7 and C7-T1 anterior cervical discectomy fusion and plating.    Past Medical History:  Diagnosis Date  . Arthritis   . Complication of anesthesia    pt. woke up during hand surgery and colonoscopy, cataract surgery  . Diabetes mellitus without complication ([facility])    type 2    dx 10 yrs ago  . Dysrhythmia    BENIGN PVC'S  . GERD (gastroesophageal reflux disease)   . Headache   . History of OCD (obsessive compulsive disorder)    TO A DEGREE  . Hypertension   . Insomnia   . Nonalcoholic steatohepatitis ([facility])    DX IN 1995  . Osteoarthritis   . Pneumonia    walking pneu. in the past  . PONV (postoperative nausea and vomiting)     Past Surgical History:  Procedure Laterality Date  . BACK SURGERY     x3  . BUNIONECTOMY     RIGHT  . CARPAL TUNNEL RELEASE     RIGHT  . CHOLECYSTECTOMY    . GANGLION CYST EXCISION Right   . MYELOGRAM    . SHOULDER ARTHROSCOPY W/ ROTATOR CUFF REPAIR     4 SCOPES.Marland KitchenMarland KitchenALL ON THE RIGHT  . TUBAL LIGATION    . WOUND EXPLORATION  03/19/2015   CERVICAL WOUND   . WOUND EXPLORATION N/A 03/19/2015   Procedure: CERVICAL WOUND EXPLORATION, EVACUATION OF CERVICAL HEMATOMA;  Surgeon: Newman Pies, MD;  Location: [facility];  Service: Neurosurgery;  Laterality: N/A;  cervical wound exploration, evacuation of cervical hematoma    Allergies   Allergen Reactions  . Codeine Itching and Swelling    Facial swelling  . Lisinopril Hives    Social History   Tobacco Use  . Smoking status: Former Smoker    Packs/day: 2.00    Years: 17.00    Pack years: 34.00    Types: Cigarettes  . Smokeless tobacco: Never Used  . Tobacco comment: quit when she was 35  Substance Use Topics  . Alcohol use: Yes    Comment: OCCASIONAL  WINE    Family History  Problem Relation Age of Onset  . Breast cancer Mother    Prior to Admission medications   Medication Sig Start Date End Date Taking? Authorizing Provider  atorvastatin (LIPITOR) 40 MG tablet Take 40 mg by mouth daily at 6 PM.   Yes [provider]  cetirizine (ZYRTEC) 10 MG tablet Take 10 mg by mouth daily.   Yes [provider]  Cholecalciferol (VITAMIN D3) 5000 units CAPS Take 5,000 Units by mouth daily.   Yes [provider]  ibuprofen (ADVIL,MOTRIN) 200 MG tablet Take 400 mg by mouth every 8 (eight) hours as needed for headache or mild pain.   Yes [provider]  irbesartan-hydrochlorothiazide (AVALIDE) 300-12.5 MG tablet Take by mouth. 01/12/17 07/19/17 Yes [provider]  metFORMIN (GLUCOPHAGE) 500 MG  tablet Take 500 mg by mouth 2 (two) times daily with a meal.   Yes [provider]  Multiple Vitamin (MULTIVITAMIN WITH MINERALS) TABS tablet Take 1 tablet by mouth daily.   Yes [provider]  omeprazole (PRILOSEC) 40 MG capsule Take 40 mg by mouth daily.   Yes [provider]  ranitidine (ZANTAC) 150 MG capsule Take 150 mg by mouth daily. Takes ranitidine and cetirzine together   Yes [provider]  sertraline (ZOLOFT) 100 MG tablet Take 100 mg by mouth at bedtime.   Yes [provider]  zolpidem (AMBIEN) 10 MG tablet TAKE 0.5-1 TABLET AS NEEDED FOR SLEEP   Yes [provider]  diclofenac sodium (VOLTAREN) 1 % GEL Apply 2 g topically 4 (four) times daily as needed (pain).    [provider]     Review of Systems  Positive ROS: As above  All other systems have been reviewed and were otherwise negative with the exception of those mentioned in the HPI and as above.  Objective: Vital signs in last 24 hours: Temp:  [97.9 F (36.6 C)] 97.9 F (36.6 C) (02/04 1103) Pulse Rate:  [71] 71 (02/04 1103) Resp:  [20] 20 (02/04 1103) BP: (176)/(67) 176/67 (02/04 1103) SpO2:  [99 %] 99 % (02/04 1103) Weight:  [78.9 kg (174 lb)] 78.9 kg (174 lb) (02/04 1103) Estimated body mass index is 29.87 kg/m as calculated from the following:   Height as of 07/15/17: 5' 4"  (1.626 m).   Weight as of this encounter: 78.9 kg (174 lb).   General Appearance: Alert Head: Normocephalic, without obvious abnormality, atraumatic Eyes: PERRL, conjunctiva/corneas clear, EOM's intact,    Ears: Normal  Throat: Normal  Neck: Supple, patient's cervical incision is well-healed. Back: unremarkable the patient's lumbar incision is well-healed. Lungs: Clear to auscultation bilaterally, respirations unlabored Heart: Regular rate and rhythm, no murmur, rub or gallop Abdomen: Soft, non-tender Extremities: Extremities normal, atraumatic, no cyanosis or edema Skin: unremarkable  NEUROLOGIC:   Mental status: alert and oriented,Motor Exam - grossly normal Sensory Exam - grossly normal Reflexes:  Coordination - grossly normal Gait - grossly normal Balance - grossly normal Cranial Nerves: I: smell Not tested  II: visual acuity  OS: Normal  OD: Normal   II: visual fields Full to confrontation  II: pupils Equal, round, reactive to light  III,VII: ptosis None  III,IV,VI: extraocular muscles  Full ROM  V: mastication Normal  V: facial light touch sensation  Normal  V,VII: corneal reflex  Present  VII: facial muscle function - upper  Normal  VII: facial muscle function - lower Normal  VIII: hearing Not tested  IX: soft palate elevation  Normal  IX,X: gag reflex Present  XI: trapezius  strength  5/5  XI: sternocleidomastoid strength 5/5  XI: neck flexion strength  5/5  XII: tongue strength  Normal    Data Review Lab Results  Component Value Date   WBC 6.1 07/15/2017   HGB 12.4 07/15/2017   HCT 38.4 07/15/2017   MCV 84.6 07/15/2017   PLT 275 07/15/2017   Lab Results  Component Value Date   NA 140 07/15/2017   K 3.5 07/15/2017   CL 105 07/15/2017   CO2 23 07/15/2017   BUN 16 07/15/2017   CREATININE 0.87 07/15/2017   GLUCOSE 128 (H) 07/15/2017   No results found for: INR, PROTIME  Assessment/Plan: C6-7 and C7-T1 disc degeneration, spondylosis, stenosis, cervicalgia, cervical radiculopathy, cervical thoracic spondylolisthesis: I have discussed the situation  with the patient.  I have reviewed her imaging studies with her and pointed out the abnormalities.  We have discussed the various treatment options including surgery.  I have described the surgical treatment option of an exploration of a cervical fusion, removal of old cervical plate, C6-7 and C7-T1 anterior cervical discectomy, fusion and plating.  I have described as surgery to her.  I have shown her surgical models.  I have given her a surgical pamphlet.  We have discussed the risks, benefits, alternatives, expected postoperative course, and likelihood of achieving our goals with surgery.  I have answered all her questions.  She has decided to proceed with surgery.   [name] 07/19/2017 12:49 PM

## 2017-07-19 NOTE — Addendum Note (Signed)
Addendum  created 07/19/17 1734 by Murvin Natal, MD   Order list changed

## 2017-07-19 NOTE — Addendum Note (Signed)
Addendum  created 07/19/17 1814 by Murvin Natal, MD   Order list changed, Order sets accessed

## 2017-07-19 NOTE — Anesthesia Preprocedure Evaluation (Signed)
Anesthesia Evaluation    Reviewed: Allergy & Precautions, H&P , Patient's Chart, lab work & pertinent test results  History of Anesthesia Complications (+) PONV and history of anesthetic complications  Airway Mallampati: III  TM Distance: >3 FB Neck ROM: Limited    Dental no notable dental hx. (+) Teeth Intact, Dental Advisory Given   Pulmonary Current Smoker, former smoker,    Pulmonary exam normal breath sounds clear to auscultation       Cardiovascular hypertension, Pt. on medications  Rhythm:Regular Rate:Normal     Neuro/Psych PSYCHIATRIC DISORDERS Anxiety Depression    GI/Hepatic Neg liver ROS, GERD  Medicated and Controlled,  Endo/Other  diabetes, Type 2, Oral Hypoglycemic Agents  Renal/GU negative Renal ROS  negative genitourinary   Musculoskeletal  (+) Arthritis , Osteoarthritis,    Abdominal   Peds  Hematology negative hematology ROS (+)   Anesthesia Other Findings   Reproductive/Obstetrics negative OB ROS                             Anesthesia Physical  Anesthesia Plan  ASA: II  Anesthesia Plan: General   Post-op Pain Management:    Induction: Intravenous  PONV Risk Score and Plan: 4 or greater and Ondansetron, Dexamethasone, Scopolamine patch - Pre-op and Treatment may vary due to age or medical condition  Airway Management Planned: Oral ETT  Additional Equipment:   Intra-op Plan:   Post-operative Plan: Extubation in OR  Informed Consent:   Plan Discussed with:   Anesthesia Plan Comments:         Anesthesia Quick Evaluation

## 2017-07-19 NOTE — Transfer of Care (Signed)
Immediate Anesthesia Transfer of Care Note  Patient: Elaine Roach  Procedure(s) Performed: ANTERIOR CERVICAL DECOMPRESSION/DISCECTOMY FUSION , INTERBODY PROSTHESIS, ANTERIOR PLATE CERVICAL SIX- CERVICAL SEVEN, CERVICAL SEVEN- THORACIC ONE, EXPLORE OLD FUSION (Spine Cervical)  Patient Location: PACU  Anesthesia Type:General  Level of Consciousness: drowsy and patient cooperative  Airway & Oxygen Therapy: Patient Spontanous Breathing and Patient connected to nasal cannula oxygen  Post-op Assessment: Report given to RN  Post vital signs: Reviewed and stable  Last Vitals:  Vitals:   07/19/17 1103  BP: (!) 176/67  Pulse: 71  Resp: 20  Temp: 36.6 C  SpO2: 99%    Last Pain:  Vitals:   07/19/17 1150  TempSrc:   PainSc: 3          Complications: No apparent anesthesia complications

## 2017-07-19 NOTE — Anesthesia Procedure Notes (Signed)
Procedure Name: Intubation Date/Time: 07/19/2017 1:06 PM Performed by: Barrington Ellison, CRNA Pre-anesthesia Checklist: Patient identified, Emergency Drugs available, Suction available and Patient being monitored Patient Re-evaluated:Patient Re-evaluated prior to induction Oxygen Delivery Method: Circle System Utilized Preoxygenation: Pre-oxygenation with 100% oxygen Induction Type: IV induction Ventilation: Mask ventilation without difficulty Laryngoscope Size: Mac and 3 Grade View: Grade I Tube type: Oral Number of attempts: 1 Airway Equipment and Method: Stylet and Oral airway Placement Confirmation: ETT inserted through vocal cords under direct vision,  positive ETCO2 and breath sounds checked- equal and bilateral Secured at: 20 cm Tube secured with: Tape Dental Injury: Teeth and Oropharynx as per pre-operative assessment

## 2017-07-19 NOTE — Anesthesia Postprocedure Evaluation (Signed)
Anesthesia Post Note  Patient: Elaine Roach  Procedure(s) Performed: ANTERIOR CERVICAL DECOMPRESSION/DISCECTOMY FUSION , INTERBODY PROSTHESIS, ANTERIOR PLATE CERVICAL SIX- CERVICAL SEVEN, CERVICAL SEVEN- THORACIC ONE, EXPLORE OLD FUSION (Spine Cervical)     Patient location during evaluation: PACU Anesthesia Type: General Level of consciousness: sedated Pain management: pain level controlled Vital Signs Assessment: post-procedure vital signs reviewed and stable Respiratory status: spontaneous breathing and respiratory function stable Cardiovascular status: stable Postop Assessment: no apparent nausea or vomiting Anesthetic complications: no    Last Vitals:  Vitals:   07/19/17 1656 07/19/17 1710  BP:  140/71  Pulse: 90 90  Resp: 16 15  Temp:    SpO2: 90% 91%    Last Pain:  Vitals:   07/19/17 1150  TempSrc:   PainSc: 3     LLE Motor Response: Purposeful movement;Responds to commands (07/19/17 1710) LLE Sensation: No numbness;No tingling (07/19/17 1710) RLE Motor Response: Purposeful movement;Responds to commands (07/19/17 1710) RLE Sensation: No numbness;No tingling (07/19/17 1710)      [place]

## 2017-07-19 NOTE — Op Note (Signed)
Brief history: The patient is a 67 year old white female on whom I performed a C4-5 and C5-6 anterior cervical discectomy, fusion and plating in 2016.  The patient has had chronic and worsening neck pain with arm pain.  She has failed medical management.  She was worked up with a cervical myelo CT which demonstrated disc degeneration, spondylosis, foraminal stenosis at C6-7 and a C7-T1 spondylolisthesis.  I discussed the various treatment options with the patient including surgery.  She has weighed the risks, benefits, and alternatives of surgery and decided proceed with a exploration of her fusion with a C6-7 and C7-T1 anterior cervical discectomy, fusion and plating.  Preoperative diagnosis: C6-7 disc degeneration, spondylosis, C7-T1 spinal listhesis, cervicalgia, cervical radiculopathy  Postoperative diagnosis: The same  Procedure: C6-7 and C7-T1 anterior cervical discectomy/decompression; C6-7 and C7-T1 interbody arthrodesis with local morcellized autograft bone and Kinnex bone graft extender; insertion of interbody prosthesis at C6-7 and C7-T1 (Zimmer peek interbody prosthesis); anterior cervical plating from C6-T1 with globus titanium plate; exploration of cervical fusion/removal of old medical hardware  Surgeon: Dr. Earle Gell  Asst.: Dr. Cyndy Freeze  Anesthesia: Gen. endotracheal  Estimated blood loss: 125 cc  Drains: One prevertebral Jackson-Pratt drain.  Complications: None  Description of procedure: The patient was brought to the operating room by the anesthesia team. General endotracheal anesthesia was induced. A roll was placed under the patient's shoulders to keep the neck in the neutral position. The patient's anterior cervical region was then prepared with Betadine scrub and Betadine solution. Sterile drapes were applied.  The area to be incised was then injected with Marcaine with epinephrine solution. I then used a scalpel to make a transverse incision in the patient's left  anterior neck. I used the Metzenbaum scissors to divide the platysmal muscle and then to dissect medial to the sternocleidomastoid muscle, jugular vein, and carotid artery. I carefully dissected through the scar tissue from the old operation, down towards the anterior cervical spine identifying the esophagus and retracting it medially. Then using Kitner swabs to clear soft tissue from the anterior cervical spine and exposed the old hardware.  We explored the fusion by unlocking the cams on the old plate, remove the screws and then removed the plate.  The arthrodesis appears solid at C4-5 and C5-6.    I then used electrocautery to detach the medial border of the longus colli muscle bilaterally from the C6-7 and C7-T1 intervertebral disc spaces. I then inserted the Caspar self-retaining retractor underneath the longus colli muscle bilaterally to provide exposure.  We then incised the intervertebral disc at C6-7, it was quite spondylotic and collapsed. We then performed a partial intervertebral discectomy with a pituitary forceps and the Karlin curettes. I then inserted distraction screws into the vertebral bodies at C6 and C7. We then distracted the interspace. We then used the high-speed drill to decorticate the vertebral endplates at L3-8, to drill away the remainder of the intervertebral disc, to drill away some posterior spondylosis, and to thin out the posterior longitudinal ligament. I then incised ligament with the arachnoid knife. We then removed the ligament with a Kerrison punches undercutting the vertebral endplates and decompressing the thecal sac. We then performed foraminotomies about the bilateral C7 nerve roots. This completed the decompression at this level.  We repeated this procedure in an analogous fashion at C7-T1 decompressing the thecal sac and the bilateral C8 nerve root.  We now turned our to attention to the interbody fusion. We used the trial spacers to determine the appropriate  size  for the interbody prosthesis. We then pre-filled prosthesis with a combination of local morcellized autograft bone that we obtained during decompression as well as Kinnex bone graft extender. We then inserted the prosthesis into the distracted interspace at C6-7 and C7-T1. We then removed the distraction screws. There was a good snug fit of the prosthesis in the interspace.  Having completed the fusion we now turned attention to the anterior spinal instrumentation. We used the high-speed drill to drill away some anterior spondylosis at the disc spaces so that the plate lay down flat. We selected the appropriate length titanium anterior cervical plate. We laid it along the anterior aspect of the vertebral bodies from C6-T1. We then drilled 12 mm holes at C7 and T1.  We used the old screw holes at C6. We then secured the plate to the vertebral bodies by placing two 12 mm self-tapping screws at C6, C7 and T1.  We locked the cams.  We then obtained intraoperative radiograph.  We could not see the instrumentation because of the patient's shoulders.  Instrumentation look good in vivo.  We then obtained hemostasis using bipolar electrocautery. We irrigated the wound out with bacitracin solution. We then removed the retractor. We inspected the esophagus for any damage. There was none apparent.  We placed a 10 mm flat Jackson-Pratt drain in the prevertebral space.  We tunneled it out through a separate stab wound.  We then reapproximated patient's platysmal muscle with interrupted 3-0 Vicryl suture. We then reapproximated the subcutaneous tissue with interrupted 3-0 Vicryl suture. The skin was reapproximated with Steri-Strips and benzoin. The wound was then covered with bacitracin ointment. A sterile dressing was applied. The drapes were removed. Patient was subsequently extubated by the anesthesia team and transported to the post anesthesia care unit in stable condition. All sponge instrument and needle counts were  reportedly correct at the end of this case.

## 2017-07-19 NOTE — Progress Notes (Signed)
Patient ID: Elaine Roach, female   DOB: Jan 15, 1951, 67 y.o.   MRN: [id number] Subjective: The patient is alert and pleasant.  She is in no apparent distress.  Objective: Vital signs in last 24 hours: Temp:  [97.9 F (36.6 C)] 97.9 F (36.6 C) (02/04 1103) Pulse Rate:  [71] 71 (02/04 1103) Resp:  [20] 20 (02/04 1103) BP: (176)/(67) 176/67 (02/04 1103) SpO2:  [99 %] 99 % (02/04 1103) Weight:  [78.9 kg (174 lb)] 78.9 kg (174 lb) (02/04 1103) Estimated body mass index is 29.87 kg/m as calculated from the following:   Height as of 07/15/17: 5' 4"  (1.626 m).   Weight as of this encounter: 78.9 kg (174 lb).   Intake/Output from previous day: No intake/output data recorded. Intake/Output this shift: Total I/O In: 1000 [I.V.:1000] Out: 100 [Blood:100]  Physical exam the patient is alert and pleasant.  She is moving all 4 extremities well.  The patient's dressing is clean and dry.  There is no evidence of hematoma or shift.  Lab Results: No results for input(s): WBC, HGB, HCT, PLT in the last 72 hours. BMET No results for input(s): NA, K, CL, CO2, GLUCOSE, BUN, CREATININE, CALCIUM in the last 72 hours.  Studies/Results: No results found.  Assessment/Plan: The patient is doing well.  LOS: 0 days     [name] 07/19/2017, 4:18 PM

## 2017-07-20 DIAGNOSIS — M5013 Cervical disc disorder with radiculopathy, cervicothoracic region: Secondary | ICD-10-CM | POA: Diagnosis present

## 2017-07-20 DIAGNOSIS — Z885 Allergy status to narcotic agent status: Secondary | ICD-10-CM | POA: Diagnosis not present

## 2017-07-20 DIAGNOSIS — I10 Essential (primary) hypertension: Secondary | ICD-10-CM | POA: Diagnosis present

## 2017-07-20 DIAGNOSIS — Z87891 Personal history of nicotine dependence: Secondary | ICD-10-CM | POA: Diagnosis not present

## 2017-07-20 DIAGNOSIS — Z8701 Personal history of pneumonia (recurrent): Secondary | ICD-10-CM | POA: Diagnosis not present

## 2017-07-20 DIAGNOSIS — Z79899 Other long term (current) drug therapy: Secondary | ICD-10-CM | POA: Diagnosis not present

## 2017-07-20 DIAGNOSIS — M79603 Pain in arm, unspecified: Secondary | ICD-10-CM | POA: Diagnosis present

## 2017-07-20 DIAGNOSIS — M4802 Spinal stenosis, cervical region: Secondary | ICD-10-CM | POA: Diagnosis present

## 2017-07-20 DIAGNOSIS — F419 Anxiety disorder, unspecified: Secondary | ICD-10-CM | POA: Diagnosis present

## 2017-07-20 DIAGNOSIS — Z888 Allergy status to other drugs, medicaments and biological substances status: Secondary | ICD-10-CM | POA: Diagnosis not present

## 2017-07-20 DIAGNOSIS — K7581 Nonalcoholic steatohepatitis (NASH): Secondary | ICD-10-CM | POA: Diagnosis present

## 2017-07-20 DIAGNOSIS — K219 Gastro-esophageal reflux disease without esophagitis: Secondary | ICD-10-CM | POA: Diagnosis present

## 2017-07-20 DIAGNOSIS — E119 Type 2 diabetes mellitus without complications: Secondary | ICD-10-CM | POA: Diagnosis present

## 2017-07-20 DIAGNOSIS — Z803 Family history of malignant neoplasm of breast: Secondary | ICD-10-CM | POA: Diagnosis not present

## 2017-07-20 DIAGNOSIS — Z981 Arthrodesis status: Secondary | ICD-10-CM | POA: Diagnosis not present

## 2017-07-20 DIAGNOSIS — G8929 Other chronic pain: Secondary | ICD-10-CM | POA: Diagnosis present

## 2017-07-20 DIAGNOSIS — F329 Major depressive disorder, single episode, unspecified: Secondary | ICD-10-CM | POA: Diagnosis present

## 2017-07-20 DIAGNOSIS — M542 Cervicalgia: Secondary | ICD-10-CM | POA: Diagnosis present

## 2017-07-20 DIAGNOSIS — M4313 Spondylolisthesis, cervicothoracic region: Secondary | ICD-10-CM | POA: Diagnosis present

## 2017-07-20 DIAGNOSIS — M4722 Other spondylosis with radiculopathy, cervical region: Secondary | ICD-10-CM | POA: Diagnosis present

## 2017-07-20 DIAGNOSIS — Z9049 Acquired absence of other specified parts of digestive tract: Secondary | ICD-10-CM | POA: Diagnosis not present

## 2017-07-20 DIAGNOSIS — Z7984 Long term (current) use of oral hypoglycemic drugs: Secondary | ICD-10-CM | POA: Diagnosis not present

## 2017-07-20 DIAGNOSIS — M50123 Cervical disc disorder at C6-C7 level with radiculopathy: Secondary | ICD-10-CM | POA: Diagnosis present

## 2017-07-20 LAB — GLUCOSE, CAPILLARY
Glucose-Capillary: 156 mg/dL — ABNORMAL HIGH (ref 65–99)
Glucose-Capillary: 168 mg/dL — ABNORMAL HIGH (ref 65–99)

## 2017-07-20 MED ORDER — HYDROMORPHONE HCL 4 MG PO TABS: 4.0000 mg | ORAL_TABLET | ORAL | 0 refills | Status: DC | PRN

## 2017-07-20 MED ORDER — CYCLOBENZAPRINE HCL 10 MG PO TABS: 10.0000 mg | ORAL_TABLET | Freq: Three times a day (TID) | ORAL | 1 refills | Status: DC | PRN

## 2017-07-20 MED ORDER — DOCUSATE SODIUM 100 MG PO CAPS: 100.0000 mg | ORAL_CAPSULE | Freq: Two times a day (BID) | ORAL | 0 refills | Status: DC

## 2017-07-20 MED FILL — Thrombin For Soln 5000 Unit: CUTANEOUS | Qty: 5000 | Status: AC

## 2017-07-20 NOTE — Progress Notes (Signed)
Patient alert and oriented, mae's well, voiding adequate amount of urine, swallowing without difficulty, no c/o pain at time of discharge. Patient discharged home with family. Script and discharged instructions given to patient. Patient and family stated understanding of instructions given. Patient has an appointment with Dr. Arnoldo Morale

## 2017-07-20 NOTE — Discharge Summary (Signed)
Physician Discharge Summary  Patient ID: Elaine Roach MRN: [id number] DOB/AGE: [birth year] 67 y.o.  Admit date: 07/19/2017 Discharge date: 07/20/2017  Admission Diagnoses: Cervicalgia, cervical radiculopathy, cervical thoracic spondylolisthesis, cervical spondylosis, cervical stenosis  Discharge Diagnoses: The same Active Problems:   Cervical spondylosis with radiculopathy   Discharged Condition: good  Hospital Course: I performed an exploration of the patient's cervical fusion with a C6-7 and C7-T1 anterior cervical discectomy, fusion and plating on 07/19/2017.  The surgery went well.  The patient's postoperative course was unremarkable.  On postoperative day #1 she requested discharge home.  She was given written and oral discharge instructions.  All questions were answered.  Consults: None Significant Diagnostic Studies: None Treatments: Exploration of cervical fusion, removal of old cervical plate and screws, F7-4 and C7-T1 anterior cervical discectomy, fusion and plating Discharge Exam: Blood pressure (!) 104/56, pulse 64, temperature 98.2 F (36.8 C), temperature source Oral, resp. rate 18, weight 78.9 kg (174 lb), SpO2 94 %. The patient is alert and pleasant.  She looks well.  Her strength is normal.  Her dressing is clean and dry.  There is no hematoma or shift.  The drain has been removed.  Disposition: Home  Discharge Instructions    Call MD for:  difficulty breathing, headache or visual disturbances   Complete by:  As directed    Call MD for:  extreme fatigue   Complete by:  As directed    Call MD for:  hives   Complete by:  As directed    Call MD for:  persistant dizziness or light-headedness   Complete by:  As directed    Call MD for:  persistant nausea and vomiting   Complete by:  As directed    Call MD for:  redness, tenderness, or signs of infection (pain, swelling, redness, odor or green/yellow discharge around incision site)   Complete by:  As directed    Call  MD for:  severe uncontrolled pain   Complete by:  As directed    Call MD for:  temperature >100.4   Complete by:  As directed    Diet - low sodium heart healthy   Complete by:  As directed    Discharge instructions   Complete by:  As directed    Call [phone] for a followup appointment. Take a stool softener while you are using pain medications.   Driving Restrictions   Complete by:  As directed    Do not drive for 2 weeks.   Increase activity slowly   Complete by:  As directed    Lifting restrictions   Complete by:  As directed    Do not lift more than 5 pounds. No excessive bending or twisting.   May shower / Bathe   Complete by:  As directed    He may shower after the pain she is removed 3 days after surgery. Leave the incision alone.   Remove dressing in 48 hours   Complete by:  As directed    Your stitches are under the scan and will dissolve by themselves. The Steri-Strips will fall off after you take a few showers. Do not rub back or pick at the wound, Leave the wound alone.     Allergies as of 07/20/2017      Reactions   Codeine Itching, Swelling   Facial swelling   Lisinopril Hives      Medication List    STOP taking these medications   AMBIEN 10 MG tablet Generic drug:  zolpidem   ibuprofen 200 MG tablet Commonly known as:  ADVIL,MOTRIN     TAKE these medications   atorvastatin 40 MG tablet Commonly known as:  LIPITOR Take 40 mg by mouth daily at 6 PM.   cetirizine 10 MG tablet Commonly known as:  ZYRTEC Take 10 mg by mouth daily.   cyclobenzaprine 10 MG tablet Commonly known as:  FLEXERIL Take 1 tablet (10 mg total) by mouth 3 (three) times daily as needed for muscle spasms.   diclofenac sodium 1 % Gel Commonly known as:  VOLTAREN Apply 2 g topically 4 (four) times daily as needed (pain).   docusate sodium 100 MG capsule Commonly known as:  COLACE Take 1 capsule (100 mg total) by mouth 2 (two) times daily.   HYDROmorphone 4 MG  tablet Commonly known as:  DILAUDID Take 1 tablet (4 mg total) by mouth every 4 (four) hours as needed for severe pain.   irbesartan-hydrochlorothiazide 300-12.5 MG tablet Commonly known as:  AVALIDE Take by mouth.   metFORMIN 500 MG tablet Commonly known as:  GLUCOPHAGE Take 500 mg by mouth 2 (two) times daily with a meal.   multivitamin with minerals Tabs tablet Take 1 tablet by mouth daily.   omeprazole 40 MG capsule Commonly known as:  PRILOSEC Take 40 mg by mouth daily.   ranitidine 150 MG capsule Commonly known as:  ZANTAC Take 150 mg by mouth daily. Takes ranitidine and cetirzine together   sertraline 100 MG tablet Commonly known as:  ZOLOFT Take 100 mg by mouth at bedtime.   Vitamin D3 5000 units Caps Take 5,000 Units by mouth daily.        Signed: [name] 07/20/2017, 7:39 AM

## 2017-07-21 ENCOUNTER — Encounter (HOSPITAL_COMMUNITY): Payer: Self-pay | Admitting: Neurosurgery

## 2017-07-23 ENCOUNTER — Inpatient Hospital Stay (HOSPITAL_COMMUNITY)
Admission: AD | Admit: 2017-07-23 | Discharge: 2017-07-25 | DRG: 392 | Disposition: A | Discharge status: Home / Self Care | Payer: PPO | Source: Ambulatory Visit | Attending: Neurosurgery | Admitting: Neurosurgery

## 2017-07-23 ENCOUNTER — Other Ambulatory Visit: Payer: Self-pay

## 2017-07-23 ENCOUNTER — Inpatient Hospital Stay: Payer: Self-pay

## 2017-07-23 ENCOUNTER — Encounter (HOSPITAL_COMMUNITY): Payer: Self-pay | Admitting: *Deleted

## 2017-07-23 ENCOUNTER — Inpatient Hospital Stay (HOSPITAL_COMMUNITY): Payer: PPO

## 2017-07-23 DIAGNOSIS — E860 Dehydration: Secondary | ICD-10-CM | POA: Diagnosis not present

## 2017-07-23 DIAGNOSIS — Z87891 Personal history of nicotine dependence: Secondary | ICD-10-CM

## 2017-07-23 DIAGNOSIS — I10 Essential (primary) hypertension: Secondary | ICD-10-CM | POA: Diagnosis present

## 2017-07-23 DIAGNOSIS — I2510 Atherosclerotic heart disease of native coronary artery without angina pectoris: Secondary | ICD-10-CM | POA: Diagnosis not present

## 2017-07-23 DIAGNOSIS — K219 Gastro-esophageal reflux disease without esophagitis: Secondary | ICD-10-CM | POA: Diagnosis present

## 2017-07-23 DIAGNOSIS — M4722 Other spondylosis with radiculopathy, cervical region: Secondary | ICD-10-CM | POA: Diagnosis not present

## 2017-07-23 DIAGNOSIS — K7581 Nonalcoholic steatohepatitis (NASH): Secondary | ICD-10-CM | POA: Diagnosis not present

## 2017-07-23 DIAGNOSIS — E119 Type 2 diabetes mellitus without complications: Secondary | ICD-10-CM | POA: Diagnosis not present

## 2017-07-23 DIAGNOSIS — F429 Obsessive-compulsive disorder, unspecified: Secondary | ICD-10-CM | POA: Diagnosis present

## 2017-07-23 DIAGNOSIS — R609 Edema, unspecified: Secondary | ICD-10-CM

## 2017-07-23 DIAGNOSIS — E782 Mixed hyperlipidemia: Secondary | ICD-10-CM | POA: Diagnosis present

## 2017-07-23 DIAGNOSIS — R221 Localized swelling, mass and lump, neck: Secondary | ICD-10-CM | POA: Diagnosis not present

## 2017-07-23 DIAGNOSIS — Z7984 Long term (current) use of oral hypoglycemic drugs: Secondary | ICD-10-CM

## 2017-07-23 DIAGNOSIS — G894 Chronic pain syndrome: Secondary | ICD-10-CM | POA: Diagnosis present

## 2017-07-23 DIAGNOSIS — R1310 Dysphagia, unspecified: Principal | ICD-10-CM | POA: Diagnosis present

## 2017-07-23 DIAGNOSIS — Z981 Arthrodesis status: Secondary | ICD-10-CM | POA: Diagnosis not present

## 2017-07-23 LAB — COMPREHENSIVE METABOLIC PANEL
ALT: 181 U/L — ABNORMAL HIGH (ref 14–54)
AST: 110 U/L — ABNORMAL HIGH (ref 15–41)
Albumin: 3.3 g/dL — ABNORMAL LOW (ref 3.5–5.0)
Alkaline Phosphatase: 281 U/L — ABNORMAL HIGH (ref 38–126)
Anion gap: 13 (ref 5–15)
BUN: 12 mg/dL (ref 6–20)
CO2: 26 mmol/L (ref 22–32)
Calcium: 8.9 mg/dL (ref 8.9–10.3)
Chloride: 98 mmol/L — ABNORMAL LOW (ref 101–111)
Creatinine, Ser: 0.85 mg/dL (ref 0.44–1.00)
GFR calc Af Amer: >60 mL/min (ref >60)
GFR calc non Af Amer: >60 mL/min (ref >60)
Glucose, Bld: 173 mg/dL — ABNORMAL HIGH (ref 65–99)
Potassium: 3.9 mmol/L (ref 3.5–5.1)
Sodium: 137 mmol/L (ref 135–145)
Total Bilirubin: 1.9 mg/dL — ABNORMAL HIGH (ref 0.3–1.2)
Total Protein: 6.4 g/dL — ABNORMAL LOW (ref 6.5–8.1)

## 2017-07-23 MED ORDER — HYDROCODONE-ACETAMINOPHEN 5-325 MG PO TABS
1.0000 | ORAL_TABLET | ORAL | Status: DC | PRN
  Administered 2017-07-23: 1 via ORAL
  Filled 2017-07-23: qty 2

## 2017-07-23 MED ORDER — SODIUM CHLORIDE 0.9 % IV SOLN
INTRAVENOUS | Status: DC
  Administered 2017-07-23: 19:00:00 via INTRAVENOUS
  Administered 2017-07-24: 75 mL via INTRAVENOUS
  Administered 2017-07-24: 22:00:00 via INTRAVENOUS

## 2017-07-23 MED ORDER — HYDROMORPHONE HCL 2 MG PO TABS
4.0000 mg | ORAL_TABLET | ORAL | Status: DC | PRN
  Administered 2017-07-23 – 2017-07-24 (×4): 4 mg via ORAL
  Filled 2017-07-23 (×4): qty 2

## 2017-07-23 MED ORDER — ONDANSETRON HCL 4 MG PO TABS: 4.0000 mg | ORAL_TABLET | Freq: Four times a day (QID) | ORAL | Status: DC | PRN

## 2017-07-23 MED ORDER — LACTATED RINGERS IV SOLN
INTRAVENOUS | Status: DC
  Administered 2017-07-23: 16:00:00 via INTRAVENOUS

## 2017-07-23 MED ORDER — ZOLPIDEM TARTRATE 5 MG PO TABS
5.0000 mg | ORAL_TABLET | Freq: Every evening | ORAL | Status: DC | PRN
  Administered 2017-07-24: 5 mg via ORAL
  Filled 2017-07-23: qty 1

## 2017-07-23 MED ORDER — SODIUM CHLORIDE 0.9% FLUSH: 3.0000 mL | INTRAVENOUS | Status: DC | PRN

## 2017-07-23 MED ORDER — ACETAMINOPHEN 500 MG PO TABS
1000.0000 mg | ORAL_TABLET | Freq: Four times a day (QID) | ORAL | Status: AC
  Administered 2017-07-23 – 2017-07-24 (×2): 1000 mg via ORAL
  Filled 2017-07-23 (×2): qty 2

## 2017-07-23 MED ORDER — PHENOL 1.4 % MT LIQD: 1.0000 | OROMUCOSAL | Status: DC | PRN

## 2017-07-23 MED ORDER — CYCLOBENZAPRINE HCL 10 MG PO TABS: 10.0000 mg | ORAL_TABLET | Freq: Three times a day (TID) | ORAL | Status: DC | PRN

## 2017-07-23 MED ORDER — SODIUM CHLORIDE 0.9% FLUSH: 3.0000 mL | Freq: Two times a day (BID) | INTRAVENOUS | Status: DC

## 2017-07-23 MED ORDER — ACETAMINOPHEN 650 MG RE SUPP: 650.0000 mg | RECTAL | Status: DC | PRN

## 2017-07-23 MED ORDER — MAGNESIUM CITRATE PO SOLN: 1.0000 | Freq: Once | ORAL | Status: DC | PRN

## 2017-07-23 MED ORDER — METHOCARBAMOL 500 MG PO TABS
750.0000 mg | ORAL_TABLET | Freq: Four times a day (QID) | ORAL | Status: DC
  Administered 2017-07-23 – 2017-07-24 (×4): 750 mg via ORAL
  Filled 2017-07-23 (×4): qty 2

## 2017-07-23 MED ORDER — MENTHOL 3 MG MT LOZG: 1.0000 | LOZENGE | OROMUCOSAL | Status: DC | PRN

## 2017-07-23 MED ORDER — SODIUM CHLORIDE 0.9% FLUSH
3.0000 mL | Freq: Two times a day (BID) | INTRAVENOUS | Status: DC
  Administered 2017-07-25: 3 mL via INTRAVENOUS

## 2017-07-23 MED ORDER — ALUM & MAG HYDROXIDE-SIMETH 200-200-20 MG/5ML PO SUSP
30.0000 mL | Freq: Four times a day (QID) | ORAL | Status: DC | PRN
  Administered 2017-07-24: 30 mL via ORAL
  Filled 2017-07-23: qty 30

## 2017-07-23 MED ORDER — IOPAMIDOL (ISOVUE-300) INJECTION 61%
INTRAVENOUS | Status: AC
  Administered 2017-07-23: 75 mL
  Filled 2017-07-23: qty 75

## 2017-07-23 MED ORDER — DEXAMETHASONE SODIUM PHOSPHATE 10 MG/ML IJ SOLN
10.0000 mg | Freq: Once | INTRAMUSCULAR | Status: AC
  Administered 2017-07-23: 10 mg via INTRAVENOUS
  Filled 2017-07-23: qty 1

## 2017-07-23 MED ORDER — BISACODYL 10 MG RE SUPP: 10.0000 mg | Freq: Every day | RECTAL | Status: DC | PRN

## 2017-07-23 MED ORDER — ACETAMINOPHEN 325 MG PO TABS: 650.0000 mg | ORAL_TABLET | ORAL | Status: DC | PRN

## 2017-07-23 MED ORDER — ZOLPIDEM TARTRATE 5 MG PO TABS: 5.0000 mg | ORAL_TABLET | Freq: Every evening | ORAL | Status: DC | PRN

## 2017-07-23 MED ORDER — ACETAMINOPHEN 325 MG PO TABS: 650.0000 mg | ORAL_TABLET | Freq: Four times a day (QID) | ORAL | Status: DC | PRN

## 2017-07-23 MED ORDER — PANTOPRAZOLE SODIUM 40 MG IV SOLR
40.0000 mg | Freq: Every day | INTRAVENOUS | Status: DC
  Administered 2017-07-23: 40 mg via INTRAVENOUS
  Filled 2017-07-23: qty 40

## 2017-07-23 MED ORDER — SODIUM CHLORIDE 0.9 % IV SOLN
250.0000 mL | INTRAVENOUS | Status: DC | PRN
Start: 2017-07-23 — End: 2017-07-25

## 2017-07-23 MED ORDER — CEFAZOLIN SODIUM-DEXTROSE 2-4 GM/100ML-% IV SOLN
2.0000 g | Freq: Three times a day (TID) | INTRAVENOUS | Status: DC
  Administered 2017-07-23: 2 g via INTRAVENOUS
  Filled 2017-07-23 (×2): qty 100

## 2017-07-23 MED ORDER — DOCUSATE SODIUM 100 MG PO CAPS
100.0000 mg | ORAL_CAPSULE | Freq: Two times a day (BID) | ORAL | Status: DC
  Administered 2017-07-23 – 2017-07-25 (×4): 100 mg via ORAL
  Filled 2017-07-23 (×4): qty 1

## 2017-07-23 MED ORDER — ONDANSETRON HCL 4 MG/2ML IJ SOLN: 4.0000 mg | Freq: Four times a day (QID) | INTRAMUSCULAR | Status: DC | PRN

## 2017-07-23 MED ORDER — MORPHINE SULFATE (PF) 4 MG/ML IV SOLN
4.0000 mg | INTRAVENOUS | Status: DC | PRN
  Administered 2017-07-23: 4 mg via INTRAVENOUS
  Filled 2017-07-23: qty 1

## 2017-07-23 MED ORDER — DEXAMETHASONE SODIUM PHOSPHATE 4 MG/ML IJ SOLN
8.0000 mg | Freq: Four times a day (QID) | INTRAMUSCULAR | Status: AC
  Administered 2017-07-23 – 2017-07-24 (×5): 8 mg via INTRAVENOUS
  Filled 2017-07-23 (×6): qty 2

## 2017-07-23 MED ORDER — ACETAMINOPHEN 650 MG RE SUPP: 650.0000 mg | Freq: Four times a day (QID) | RECTAL | Status: DC | PRN

## 2017-07-23 NOTE — H&P (Signed)
Chief Complaint   dysphagia  HPI   HPI: Elaine Roach is a 67 y.o. female who is s/p C6-T1 ACDF by Dr Arnoldo Morale on 07/19/2017 with discharge 07/20/2017. Post operative course was fairly unremarkable with the exception of progressive dysphagia that started this am. Reports still able to swallow liquids and soft foods, but pills and hard foods are difficult. She presented to [facility] outpt today. Dr Arnoldo Morale decided to direct admit for IV fluids and decadron.  She complains of bilateral shoulder pain but manageable. Does complain of redness around surgical site but is allergic to the tape used. No fever or drainage from incision site. Is hopeful to go home tomorrow.   Patient Active Problem List   Diagnosis Date Noted  . Cervical spondylosis with radiculopathy 07/19/2017  . Cramp in limb 03/04/2017  . Fatigue 03/04/2017  . Low back pain 03/04/2017  . Obsessive-compulsive disorder 03/04/2017  . Cervical dysplasia 02/23/2016  . Hypercalcemia 02/23/2016  . Nulligravida 02/23/2016  . Acute infection of nasal sinus 08/26/2015  . Chronic pain associated with significant psychosocial dysfunction 08/26/2015  . Chronic pain syndrome 08/26/2015  . Anxiety 08/25/2015  . Coronary arteriosclerosis in native artery 08/25/2015  . Hypertension 08/25/2015  . Gastroesophageal reflux disease without esophagitis 08/25/2015  . Mixed hyperlipidemia 08/25/2015  . Nonalcoholic steatohepatitis ([facility]) 08/25/2015  . Primary insomnia 08/25/2015  . Type 2 diabetes mellitus without complication, without long-term current use of insulin ([place]) 08/25/2015  . Dysphagia 03/19/2015  . Lumbar stenosis with neurogenic claudication 04/04/2014  . Spinal stenosis of lumbar region 04/04/2014    PMH: Past Medical History:  Diagnosis Date  . Arthritis   . Complication of anesthesia    pt. woke up during hand surgery and colonoscopy, cataract surgery  . Diabetes mellitus without complication ([place])    type 2    dx 10 yrs ago  .  Dysrhythmia    BENIGN PVC'S  . GERD (gastroesophageal reflux disease)   . Headache   . History of OCD (obsessive compulsive disorder)    TO A DEGREE  . Hypertension   . Insomnia   . Nonalcoholic steatohepatitis ([facility])    DX IN 1995  . Osteoarthritis   . Pneumonia    walking pneu. in the past  . PONV (postoperative nausea and vomiting)     PSH: Past Surgical History:  Procedure Laterality Date  . ANTERIOR CERVICAL DECOMP/DISCECTOMY FUSION  07/19/2017   Procedure: ANTERIOR CERVICAL DECOMPRESSION/DISCECTOMY FUSION , INTERBODY PROSTHESIS, ANTERIOR PLATE CERVICAL SIX- CERVICAL SEVEN, CERVICAL SEVEN- THORACIC ONE, EXPLORE OLD FUSION;  Surgeon: Newman Pies, MD;  Location: [facility];  Service: Neurosurgery;;  . BACK SURGERY     x3  . BUNIONECTOMY     RIGHT  . CARPAL TUNNEL RELEASE     RIGHT  . CHOLECYSTECTOMY    . GANGLION CYST EXCISION Right   . MYELOGRAM    . SHOULDER ARTHROSCOPY W/ ROTATOR CUFF REPAIR     4 SCOPES.Marland KitchenMarland KitchenALL ON THE RIGHT  . TUBAL LIGATION    . WOUND EXPLORATION  03/19/2015   CERVICAL WOUND   . WOUND EXPLORATION N/A 03/19/2015   Procedure: CERVICAL WOUND EXPLORATION, EVACUATION OF CERVICAL HEMATOMA;  Surgeon: Newman Pies, MD;  Location: [facility];  Service: Neurosurgery;  Laterality: N/A;  cervical wound exploration, evacuation of cervical hematoma    Medications Prior to Admission  Medication Sig Dispense Refill Last Dose  . atorvastatin (LIPITOR) 40 MG tablet Take 40 mg by mouth daily at 6 PM.   07/18/2017 at  Unknown time  . cetirizine (ZYRTEC) 10 MG tablet Take 10 mg by mouth daily.   07/19/2017 at Unknown time  . Cholecalciferol (VITAMIN D3) 5000 units CAPS Take 5,000 Units by mouth daily.   07/12/2017  . cyclobenzaprine (FLEXERIL) 10 MG tablet Take 1 tablet (10 mg total) by mouth 3 (three) times daily as needed for muscle spasms. 50 tablet 1   . diclofenac sodium (VOLTAREN) 1 % GEL Apply 2 g topically 4 (four) times daily as needed (pain).   More than a month  at Unknown time  . docusate sodium (COLACE) 100 MG capsule Take 1 capsule (100 mg total) by mouth 2 (two) times daily. 60 capsule 0   . HYDROmorphone (DILAUDID) 4 MG tablet Take 1 tablet (4 mg total) by mouth every 4 (four) hours as needed for severe pain. 30 tablet 0   . irbesartan-hydrochlorothiazide (AVALIDE) 300-12.5 MG tablet Take by mouth.   07/18/2017 at Unknown time  . metFORMIN (GLUCOPHAGE) 500 MG tablet Take 500 mg by mouth 2 (two) times daily with a meal.   07/18/2017 at Unknown time  . Multiple Vitamin (MULTIVITAMIN WITH MINERALS) TABS tablet Take 1 tablet by mouth daily.   07/12/2017  . omeprazole (PRILOSEC) 40 MG capsule Take 40 mg by mouth daily.   07/19/2017 at Unknown time  . ranitidine (ZANTAC) 150 MG capsule Take 150 mg by mouth daily. Takes ranitidine and cetirzine together   07/18/2017 at Unknown time  . sertraline (ZOLOFT) 100 MG tablet Take 100 mg by mouth at bedtime.   07/18/2017 at Unknown time    SH: Social History   Tobacco Use  . Smoking status: Former Smoker    Packs/day: 2.00    Years: 17.00    Pack years: 34.00    Types: Cigarettes  . Smokeless tobacco: Never Used  . Tobacco comment: quit when she was 35  Substance Use Topics  . Alcohol use: Yes    Comment: OCCASIONAL  WINE  . Drug use: No    MEDS: Prior to Admission medications   Medication Sig Start Date End Date Taking? Authorizing Provider  atorvastatin (LIPITOR) 40 MG tablet Take 40 mg by mouth daily at 6 PM.    [provider]  cetirizine (ZYRTEC) 10 MG tablet Take 10 mg by mouth daily.    [provider]  Cholecalciferol (VITAMIN D3) 5000 units CAPS Take 5,000 Units by mouth daily.    [provider]  cyclobenzaprine (FLEXERIL) 10 MG tablet Take 1 tablet (10 mg total) by mouth 3 (three) times daily as needed for muscle spasms. 07/20/17   Newman Pies, MD  diclofenac sodium (VOLTAREN) 1 % GEL Apply 2 g topically 4 (four) times daily as needed (pain).    [provider]   docusate sodium (COLACE) 100 MG capsule Take 1 capsule (100 mg total) by mouth 2 (two) times daily. 07/20/17   Newman Pies, MD  HYDROmorphone (DILAUDID) 4 MG tablet Take 1 tablet (4 mg total) by mouth every 4 (four) hours as needed for severe pain. 07/20/17   Newman Pies, MD  irbesartan-hydrochlorothiazide (AVALIDE) 300-12.5 MG tablet Take by mouth. 01/12/17 07/19/17  [provider]  metFORMIN (GLUCOPHAGE) 500 MG tablet Take 500 mg by mouth 2 (two) times daily with a meal.    [provider]  Multiple Vitamin (MULTIVITAMIN WITH MINERALS) TABS tablet Take 1 tablet by mouth daily.    [provider]  omeprazole (PRILOSEC) 40 MG capsule Take 40 mg by mouth daily.  [provider]  ranitidine (ZANTAC) 150 MG capsule Take 150 mg by mouth daily. Takes ranitidine and cetirzine together    [provider]  sertraline (ZOLOFT) 100 MG tablet Take 100 mg by mouth at bedtime.    [provider]    ALLERGY: Allergies  Allergen Reactions  . Codeine Itching and Swelling    Facial swelling  . Lisinopril Hives    Social History   Tobacco Use  . Smoking status: Former Smoker    Packs/day: 2.00    Years: 17.00    Pack years: 34.00    Types: Cigarettes  . Smokeless tobacco: Never Used  . Tobacco comment: quit when she was 35  Substance Use Topics  . Alcohol use: Yes    Comment: OCCASIONAL  WINE     Family History  Problem Relation Age of Onset  . Breast cancer Mother      ROS   Review of Systems  HENT: Negative.        + dysphagia  Musculoskeletal: Positive for myalgias and neck pain. Negative for back pain, falls and joint pain.  Neurological: Negative for dizziness, tingling, tremors, sensory change, speech change, focal weakness, seizures, loss of consciousness and headaches.    Exam   Vitals:   07/23/17 1436  BP: 132/64  Pulse: 75  Temp: 98.8 F (37.1 C)  SpO2: 97%   General appearance: WDWN, NAD, nontoxic Eyes:  PERRL, Fundoscopic: normal Cardiovascular: Regular rate and rhythm without murmurs, rubs, gallops. No edema or variciosities. Distal pulses normal. Pulmonary: Clear to auscultation Musculoskeletal:     Muscle tone upper extremities: Normal    Muscle tone lower extremities: Normal    Motor exam: Upper Extremities Deltoid Bicep Tricep Grip  Right 5/5 5/5 5/5 5/5  Left 5/5 5/5 5/5 5/5   Lower Extremity IP Quad PF DF EHL  Right 5/5 5/5 5/5 5/5 5/5  Left 5/5 5/5 5/5 5/5 5/5   Neurological Awake, alert, oriented Memory and concentration grossly intact Speech fluent, appropriate CNII: Visual fields normal CNIII/IV/VI: EOMI CNV: Facial sensation normal CNVII: Symmetric, normal strength CNVIII: Grossly normal CNIX: Normal palate movement CNXI: Trap and SCM strength normal CN XII: Tongue protrusion normal Sensation grossly intact to LT  Incision Redness surrounding surgical site in perfect pattern from adhesive tape. Minimal swelling. No obvious palpable mass/abscess. There is no drainage. No real TTP.  Results - Imaging/Labs   No results found for this or any previous visit (from the past 48 hour(s)).  No results found.   Impression/Plan   67 y.o. female with post op solid food dysphagia s/p C6-T1 ACDF on 07/19/2016. She is non-toxic in appearance. Denies any signs of infection. She is neuro intact. She has been seen and evaluated by Dr Arnoldo Morale outpt and by me upon arrival to [facility]. Admitted for IV Decadron and fluids. Advance diet as tolerated. She is hopeful to go home tomorrow but will reassess in the morning.

## 2017-07-23 NOTE — H&P (Signed)
Subjective: Brief history:  The patient is a 67 year old white female on whom I performed an exploration of cervical fusion with a C6-7 and  C7-T1 anterior cervical diskectomy, fusion and plating on 07/19/2017.  The surgery went well and the patient was subsequently discharged on 07/20/2017.  The patient called and said she was having dysphagia so I saw her in the office today.  She complains of difficulty swallowing limiting her p.o. Intake.  She can't take her medications.  She has had no fever or drainage from wound.  She has had a allergic reaction to the benzoin and some erythema around her wound.   Past Medical History:  Diagnosis Date  . Arthritis   . Complication of anesthesia    pt. woke up during hand surgery and colonoscopy, cataract surgery  . Diabetes mellitus without complication ([place])    type 2    dx 10 yrs ago  . Dysrhythmia    BENIGN PVC'S  . GERD (gastroesophageal reflux disease)   . Headache   . History of OCD (obsessive compulsive disorder)    TO A DEGREE  . Hypertension   . Insomnia   . Nonalcoholic steatohepatitis ([facility])    DX IN 1995  . Osteoarthritis   . Pneumonia    walking pneu. in the past  . PONV (postoperative nausea and vomiting)     Past Surgical History:  Procedure Laterality Date  . ANTERIOR CERVICAL DECOMP/DISCECTOMY FUSION  07/19/2017   Procedure: ANTERIOR CERVICAL DECOMPRESSION/DISCECTOMY FUSION , INTERBODY PROSTHESIS, ANTERIOR PLATE CERVICAL SIX- CERVICAL SEVEN, CERVICAL SEVEN- THORACIC ONE, EXPLORE OLD FUSION;  Surgeon: Newman Pies, MD;  Location: [facility];  Service: Neurosurgery;;  . BACK SURGERY     x3  . BUNIONECTOMY     RIGHT  . CARPAL TUNNEL RELEASE     RIGHT  . CHOLECYSTECTOMY    . GANGLION CYST EXCISION Right   . MYELOGRAM    . SHOULDER ARTHROSCOPY W/ ROTATOR CUFF REPAIR     4 SCOPES.Marland KitchenMarland KitchenALL ON THE RIGHT  . TUBAL LIGATION    . WOUND EXPLORATION  03/19/2015   CERVICAL WOUND   . WOUND EXPLORATION N/A 03/19/2015   Procedure:  CERVICAL WOUND EXPLORATION, EVACUATION OF CERVICAL HEMATOMA;  Surgeon: Newman Pies, MD;  Location: [facility];  Service: Neurosurgery;  Laterality: N/A;  cervical wound exploration, evacuation of cervical hematoma    Allergies  Allergen Reactions  . Codeine Itching and Swelling    Facial swelling  . Lisinopril Hives    Social History   Tobacco Use  . Smoking status: Former Smoker    Packs/day: 2.00    Years: 17.00    Pack years: 34.00    Types: Cigarettes  . Smokeless tobacco: Never Used  . Tobacco comment: quit when she was 35  Substance Use Topics  . Alcohol use: Yes    Comment: OCCASIONAL  WINE    Family History  Problem Relation Age of Onset  . Breast cancer Mother    Prior to Admission medications   Medication Sig Start Date End Date Taking? Authorizing Provider  atorvastatin (LIPITOR) 40 MG tablet Take 40 mg by mouth daily at 6 PM.    [provider]  cetirizine (ZYRTEC) 10 MG tablet Take 10 mg by mouth daily.    [provider]  Cholecalciferol (VITAMIN D3) 5000 units CAPS Take 5,000 Units by mouth daily.    [provider]  cyclobenzaprine (FLEXERIL) 10 MG tablet Take 1 tablet (10 mg total) by mouth 3 (three)  times daily as needed for muscle spasms. 07/20/17   Newman Pies, MD  diclofenac sodium (VOLTAREN) 1 % GEL Apply 2 g topically 4 (four) times daily as needed (pain).    [provider]  docusate sodium (COLACE) 100 MG capsule Take 1 capsule (100 mg total) by mouth 2 (two) times daily. 07/20/17   Newman Pies, MD  HYDROmorphone (DILAUDID) 4 MG tablet Take 1 tablet (4 mg total) by mouth every 4 (four) hours as needed for severe pain. 07/20/17   Newman Pies, MD  irbesartan-hydrochlorothiazide (AVALIDE) 300-12.5 MG tablet Take by mouth. 01/12/17 07/19/17  [provider]  metFORMIN (GLUCOPHAGE) 500 MG tablet Take 500 mg by mouth 2 (two) times daily with a meal.    [provider]  Multiple Vitamin  (MULTIVITAMIN WITH MINERALS) TABS tablet Take 1 tablet by mouth daily.    [provider]  omeprazole (PRILOSEC) 40 MG capsule Take 40 mg by mouth daily.    [provider]  ranitidine (ZANTAC) 150 MG capsule Take 150 mg by mouth daily. Takes ranitidine and cetirzine together    [provider]  sertraline (ZOLOFT) 100 MG tablet Take 100 mg by mouth at bedtime.    [provider]     Review of Systems  Positive ROS: : As above the patient complains of dysphagia  All other systems have been reviewed and were otherwise negative with the exception of those mentioned in the HPI and as above.  Objective: Vital signs in last 24 hours: Temp:  [98.8 F (37.1 C)] 98.8 F (37.1 C) (02/08 1436) Pulse Rate:  [75] 75 (02/08 1436) BP: (132)/(64) 132/64 (02/08 1436) SpO2:  [97 %] 97 % (02/08 1436) Weight:  [79.6 kg (175 lb 7.8 oz)] 79.6 kg (175 lb 7.8 oz) (02/08 1429) Estimated body mass index is 30.12 kg/m as calculated from the following:   Height as of this encounter: 5' 4"  (1.626 m).   Weight as of this encounter: 79.6 kg (175 lb 7.8 oz).    physical exam:   General: An alert and pleasant 67 year old white female  HEENT:  Normocephalic, atraumatic  Neck:  The patient's Steri-Strips are in place.  She has erythema in the region where the  Benzoin was painted on.  She has some mild swelling without significant midline shift.  Thorax:  Symmetric  Abdomen:  Soft  Extremities:  Unremarkable  Neurologic exam:  The patient is alert and oriented x3.  Her strength is grossly normal in all 4 extremities.  Imaging studies: I obtained a lateral C-spine x-ray.  It demonstrates good position of the anterior instrumentation from C6-T1.  She has some mild to moderate prevertebral soft tissue swelling. Data Review Lab Results  Component Value Date   WBC 6.1 07/15/2017   HGB 12.4 07/15/2017   HCT 38.4 07/15/2017   MCV 84.6 07/15/2017   PLT 275 07/15/2017    Lab Results  Component Value Date   NA 140 07/15/2017   K 3.5 07/15/2017   CL 105 07/15/2017   CO2 23 07/15/2017   BUN 16 07/15/2017   CREATININE 0.87 07/15/2017   GLUCOSE 128 (H) 07/15/2017   No results found for: INR, PROTIME  Assessment/Plan:   Dysphagia, dehydration: I have discussed the situation with the patient.  I have told her she has some swelling in her neck from her previous surgery.  I do not think she needs surgery to have this evacuated.  I think she will improve with observation and steroids.  I will admit her for IV hydration and IV Decadron.  I have answered all her questions.   Elaine Roach 07/23/2017 5:39 PM

## 2017-07-23 NOTE — Progress Notes (Signed)
Patient arrived as direct admit from office. Vitals stable, office paged for pt orders. Pt oriented to room and unit.  CHG completed.  Continue to monitor pt.

## 2017-07-23 NOTE — Progress Notes (Signed)
Called for worsening dysphagia and neck swelling. Patient reports difficulty with swallowing but reports breathing comfortably.  She is in no acute distress. Her voice sounds normal.  Her incision is not worrisome but there is some erythema. Will give additional dose of steroids now.  I doubt she will need surgery.

## 2017-07-24 LAB — GLUCOSE, CAPILLARY
Glucose-Capillary: 239 mg/dL — ABNORMAL HIGH (ref 65–99)
Glucose-Capillary: 220 mg/dL — ABNORMAL HIGH (ref 65–99)
Glucose-Capillary: 200 mg/dL — ABNORMAL HIGH (ref 65–99)

## 2017-07-24 MED ORDER — SERTRALINE HCL 100 MG PO TABS
100.0000 mg | ORAL_TABLET | Freq: Every day | ORAL | Status: DC
  Administered 2017-07-24: 100 mg via ORAL
  Filled 2017-07-24: qty 1

## 2017-07-24 MED ORDER — FAMOTIDINE 20 MG PO TABS
20.0000 mg | ORAL_TABLET | Freq: Every day | ORAL | Status: DC
  Administered 2017-07-24: 20 mg via ORAL
  Filled 2017-07-24: qty 1

## 2017-07-24 MED ORDER — ATORVASTATIN CALCIUM 40 MG PO TABS
40.0000 mg | ORAL_TABLET | Freq: Every day | ORAL | Status: DC
  Administered 2017-07-24: 40 mg via ORAL
  Filled 2017-07-24: qty 1

## 2017-07-24 MED ORDER — DICLOFENAC SODIUM 1 % TD GEL
2.0000 g | Freq: Four times a day (QID) | TRANSDERMAL | Status: DC | PRN
  Filled 2017-07-24: qty 100

## 2017-07-24 MED ORDER — PANTOPRAZOLE SODIUM 40 MG PO TBEC
40.0000 mg | DELAYED_RELEASE_TABLET | Freq: Every day | ORAL | Status: DC
  Administered 2017-07-24: 40 mg via ORAL
  Filled 2017-07-24: qty 1

## 2017-07-24 MED ORDER — LORATADINE 10 MG PO TABS
10.0000 mg | ORAL_TABLET | Freq: Every day | ORAL | Status: DC
  Administered 2017-07-25: 10 mg via ORAL
  Filled 2017-07-24: qty 1

## 2017-07-24 MED ORDER — CYCLOBENZAPRINE HCL 10 MG PO TABS: 10.0000 mg | ORAL_TABLET | Freq: Three times a day (TID) | ORAL | Status: DC | PRN

## 2017-07-24 MED ORDER — ADULT MULTIVITAMIN W/MINERALS CH
1.0000 | ORAL_TABLET | Freq: Every day | ORAL | Status: DC
  Administered 2017-07-25: 1 via ORAL
  Filled 2017-07-24: qty 1

## 2017-07-24 MED ORDER — METFORMIN HCL 500 MG PO TABS
500.0000 mg | ORAL_TABLET | Freq: Two times a day (BID) | ORAL | Status: DC
  Administered 2017-07-24 – 2017-07-25 (×2): 500 mg via ORAL
  Filled 2017-07-24 (×2): qty 1

## 2017-07-24 NOTE — Progress Notes (Signed)
Patient ID: Elaine Roach, female   DOB: [birth year], 67 y.o.   MRN: [id number] Subjective: The patient is alert and pleasant.  She looks and feels "much better".  She still has some dysphagia but this has improved vastly.  She is having no dyspnea.  She wants to go home.  Objective: Vital signs in last 24 hours: Temp:  [97.6 F (36.4 C)-98.8 F (37.1 C)] 97.6 F (36.4 C) (02/09 0751) Pulse Rate:  [74-75] 74 (02/09 0751) Resp:  [18] 18 (02/09 0751) BP: (125-142)/(64-70) 132/70 (02/09 0751) SpO2:  [97 %] 97 % (02/08 1436) Weight:  [79.6 kg (175 lb 7.8 oz)] 79.6 kg (175 lb 7.8 oz) (02/08 1429) Estimated body mass index is 30.12 kg/m as calculated from the following:   Height as of this encounter: 5' 4"  (1.626 m).   Weight as of this encounter: 79.6 kg (175 lb 7.8 oz).   Intake/Output from previous day: 02/08 0701 - 02/09 0700 In: 720 [I.V.:720] Out: -  Intake/Output this shift: Total I/O In: 275 [I.V.:275] Out: -   Physical exam the patient is alert and pleasant.  She looks much better appears comfortable.  Her wound is still erythrematous from the benzoin.  There is no significant midline shift.  Lab Results: No results for input(s): WBC, HGB, HCT, PLT in the last 72 hours. BMET Recent Labs    07/23/17 2108  NA 137  K 3.9  CL 98*  CO2 26  GLUCOSE 173*  BUN 12  CREATININE 0.85  CALCIUM 8.9    Studies/Results: Ct Soft Tissue Neck W Contrast  Result Date: 07/23/2017 CLINICAL DATA:  67 y/o F; recent exploration of cervical fusion with anterior C6-7 and C7-T1 discectomy, plating, and fusion on 07/19/2017. Onset of difficulty swallowing and erythema of the left neck at incision site. EXAM: CT NECK WITH CONTRAST TECHNIQUE: Multidetector CT imaging of the neck was performed using the standard protocol following the bolus administration of intravenous contrast. CONTRAST:  12m ISOVUE-300 IOPAMIDOL (ISOVUE-300) INJECTION 61% COMPARISON:  03/17/2017 CT of the cervical spine  FINDINGS: Pharynx and larynx: No exophytic mass or mucosal thickening. The larynx and subglottic airway are displaced rightward due to edema and left-sided prevertebral fluid collection at site of left anterior neck surgery. The airway is patent. The left prevertebral fluid collection measures 10 x 26 x 46 mm (AP x ML x CC series 10, image 96 and series 9, image 61 extending from the C3-4 intervertebral disc level superiorly to the C6-7 intervertebral disc level inferiorly. Additionally there is a small component that wraps anteriorly around the left lobe of thyroid gland and anterior to left-sided strap muscles (series 10, image 96). The fluid collection is low in attenuation and does not enhance.) Additionally, there is extensive edema within subcutaneous fat and deep cervical soft tissues in the left prevertebral, parapharyngeal, and lower anterior cervical soft tissues. Salivary glands: No inflammation, mass, or stone. Thyroid: Normal. Lymph nodes: None enlarged or abnormal density. Vascular: Negative. Limited intracranial: Negative. Visualized orbits: Negative. Mastoids and visualized paranasal sinuses: Clear. Skeleton: C4-C6 solid interbody fusion with remnants of prior fusion hardware. C6-T1 anterior cervical discectomy with intact hardware and no periprosthetic lucency or fracture. Upper chest: Negative. Other: None. IMPRESSION: 1. C6-T1 anterior cervical discectomy and fusion with intact hardware and no periprosthetic lucency or fracture. 2. Extensive edema in the left anterior and lateral neck as well as a low-attenuation left prevertebral fluid collection extending from the C3-4 to C6-7 intervertebral discs levels with a small component wrapping  around the left lobe of thyroid and left anterior strap muscles. No peripheral enhancement to suggest infection, likely postoperative seroma. No high attenuation component to suggest hemorrhage. 3. Mass effect from edema and prevertebral fluid collection displaces  the aerodigestive tract rightward. The aerodigestive tract is widely patent. Electronically Signed   By: Kristine Garbe M.D.   On: 07/23/2017 23:47    Assessment/Plan: Dysphagia: I have reviewed the patient's neck CT.  There is a small/moderate fluid collection with mild midline shift.  She has mainly tissue edema.  I do not think there is anything to gain by removing the small amount of fluid.  She is doing much better clinically.  We will advance her diet.  She would likely go home tomorrow.  I have answered all her questions.  LOS: 1 day     [name] 07/24/2017, 8:52 AM

## 2017-07-24 NOTE — Progress Notes (Signed)
Pt is a direct admit to unit from followup appointment with MD post ACDF surgery around 1600 07/23/17 with dysphagia.  Pt left side of neck was red per receiving RN however there was no edema.  Pt was also able to eat soft food diet without complication.  At change of shift, pt neck presented with increased swelling and redness.  Pt was also unable to swallow crushed medications or soft foods without coughing and difficulty. Pt vitals stable.  RNs contact oncall due to risk of compromised airway.  Dr. Sharmaine Base on unit around 2030 to see pt.  MD made aware of pts inability to swallow PO medications.  An extra dose of decadron ordered as well as a CT of the patients neck.  Pt monitored closely to ensure no further issues with respiratory function. Prior to being taken for CT, pt and RN agree that the swelling has decreased.  Will continue to monitor for safety.

## 2017-07-25 LAB — GLUCOSE, CAPILLARY: Glucose-Capillary: 157 mg/dL — ABNORMAL HIGH (ref 65–99)

## 2017-07-25 MED ORDER — METHYLPREDNISOLONE 4 MG PO TBPK: ORAL_TABLET | ORAL | 0 refills | Status: DC

## 2017-07-25 NOTE — Progress Notes (Signed)
Patient discharge home with sister. Patient vertebralis understand ing of discharge instructions not to drive of lift anything greater than 10 lbs until furthered instructed by DR. Patient did agree to post op plan of care.Marland Kitchen

## 2017-07-25 NOTE — Progress Notes (Signed)
No acute events Awake, alert, oriented Breathing comfortably Voice normal Neck c/d/i and flat D/c home

## 2017-07-25 NOTE — Discharge Summary (Signed)
Physician Discharge Summary  Patient ID: Elaine Roach MRN: [id number] DOB/AGE: 1951-02-19 67 y.o.  Admit date: 07/23/2017 Discharge date: 07/25/2017  Admission Diagnoses:  Dysphagia  Discharge Diagnoses:  Same Active Problems:   Dysphagia  Discharged Condition: Stable  Hospital Course:  Elaine Roach is a 67 y.o. female who is s/p C6-T1 ACDF by Dr Arnoldo Morale on 07/19/2017 with discharge 07/20/2017. Post operative course was fairly unremarkable with the exception of progressive dysphagia. Was admitted for IV decadron and fluids. Dysphagia improved during hospital stay. Was able to tolerate po including meds. Ready for discharge. Will continue steroids at home with medrol dose pack.  Treatments: Surgery - none  Discharge Exam: Blood pressure (!) 158/67, pulse 81, temperature 98.1 F (36.7 C), temperature source Oral, resp. rate 18, height 5' 4"  (1.626 m), weight 79.6 kg (175 lb 7.8 oz), SpO2 95 %. Awake, alert, oriented Speech fluent, appropriate CN grossly intact 5/5 BUE/BLE Wound c/d/i  Disposition: 01-Home or Self Care  Discharge Instructions    Call MD for:  difficulty breathing, headache or visual disturbances   Complete by:  As directed    Call MD for:  persistant dizziness or light-headedness   Complete by:  As directed    Call MD for:  redness, tenderness, or signs of infection (pain, swelling, redness, odor or green/yellow discharge around incision site)   Complete by:  As directed    Call MD for:  severe uncontrolled pain   Complete by:  As directed    Call MD for:  temperature >100.4   Complete by:  As directed    Diet general   Complete by:  As directed    Driving Restrictions   Complete by:  As directed    Do not drive until given clearance.   Increase activity slowly   Complete by:  As directed    Lifting restrictions   Complete by:  As directed    Do not lift anything >10lbs. Avoid bending and twisting in awkward positions. Avoid bending at the back.   Remove dressing in 24 hours   Complete by:  As directed      Allergies as of 07/25/2017      Reactions   Codeine Itching, Swelling   Facial swelling   Lisinopril Hives      Medication List    TAKE these medications   atorvastatin 40 MG tablet Commonly known as:  LIPITOR Take 40 mg by mouth daily at 6 PM.   cetirizine 10 MG tablet Commonly known as:  ZYRTEC Take 10 mg by mouth daily.   cyclobenzaprine 10 MG tablet Commonly known as:  FLEXERIL Take 1 tablet (10 mg total) by mouth 3 (three) times daily as needed for muscle spasms.   diclofenac sodium 1 % Gel Commonly known as:  VOLTAREN Apply 2 g topically 4 (four) times daily as needed (pain).   docusate sodium 100 MG capsule Commonly known as:  COLACE Take 1 capsule (100 mg total) by mouth 2 (two) times daily.   HYDROmorphone 4 MG tablet Commonly known as:  DILAUDID Take 1 tablet (4 mg total) by mouth every 4 (four) hours as needed for severe pain.   irbesartan-hydrochlorothiazide 300-12.5 MG tablet Commonly known as:  AVALIDE Take by mouth.   metFORMIN 500 MG tablet Commonly known as:  GLUCOPHAGE Take 500 mg by mouth 2 (two) times daily with a meal.   methylPREDNISolone 4 MG Tbpk tablet Commonly known as:  MEDROL Take according to package insert   multivitamin with  minerals Tabs tablet Take 1 tablet by mouth daily.   omeprazole 40 MG capsule Commonly known as:  PRILOSEC Take 40 mg by mouth every morning.   ranitidine 150 MG capsule Commonly known as:  ZANTAC Take 150 mg by mouth at bedtime.   sertraline 100 MG tablet Commonly known as:  ZOLOFT Take 100 mg by mouth at bedtime.   Vitamin D3 5000 units Caps Take 5,000 Units by mouth daily.      Follow-up Information    Newman Pies, MD Follow up.   Specialty:  Neurosurgery Contact information: [address] [place] [address] [place] [address] [phone]           Signed: Traci Sermon 07/25/2017, 9:11 AM

## 2017-08-27 DIAGNOSIS — R7989 Other specified abnormal findings of blood chemistry: Secondary | ICD-10-CM | POA: Insufficient documentation

## 2017-08-27 DIAGNOSIS — R945 Abnormal results of liver function studies: Secondary | ICD-10-CM

## 2017-08-29 DIAGNOSIS — E669 Obesity, unspecified: Secondary | ICD-10-CM | POA: Insufficient documentation

## 2017-08-30 DIAGNOSIS — E782 Mixed hyperlipidemia: Secondary | ICD-10-CM | POA: Diagnosis not present

## 2017-08-30 DIAGNOSIS — I10 Essential (primary) hypertension: Secondary | ICD-10-CM | POA: Diagnosis not present

## 2017-08-30 DIAGNOSIS — E119 Type 2 diabetes mellitus without complications: Secondary | ICD-10-CM | POA: Diagnosis not present

## 2017-08-30 DIAGNOSIS — F419 Anxiety disorder, unspecified: Secondary | ICD-10-CM | POA: Diagnosis not present

## 2017-08-31 DIAGNOSIS — L821 Other seborrheic keratosis: Secondary | ICD-10-CM | POA: Diagnosis not present

## 2017-08-31 DIAGNOSIS — L728 Other follicular cysts of the skin and subcutaneous tissue: Secondary | ICD-10-CM | POA: Diagnosis not present

## 2017-10-11 DIAGNOSIS — M4722 Other spondylosis with radiculopathy, cervical region: Secondary | ICD-10-CM | POA: Diagnosis not present

## 2017-11-10 DIAGNOSIS — H04123 Dry eye syndrome of bilateral lacrimal glands: Secondary | ICD-10-CM | POA: Diagnosis not present

## 2017-12-21 DIAGNOSIS — Z124 Encounter for screening for malignant neoplasm of cervix: Secondary | ICD-10-CM | POA: Diagnosis not present

## 2017-12-21 DIAGNOSIS — Z01419 Encounter for gynecological examination (general) (routine) without abnormal findings: Secondary | ICD-10-CM | POA: Diagnosis not present

## 2018-01-24 ENCOUNTER — Other Ambulatory Visit: Payer: Self-pay | Admitting: Family Medicine

## 2018-01-24 DIAGNOSIS — Z1231 Encounter for screening mammogram for malignant neoplasm of breast: Secondary | ICD-10-CM

## 2018-01-25 DIAGNOSIS — L821 Other seborrheic keratosis: Secondary | ICD-10-CM | POA: Diagnosis not present

## 2018-01-25 DIAGNOSIS — L814 Other melanin hyperpigmentation: Secondary | ICD-10-CM | POA: Diagnosis not present

## 2018-03-02 DIAGNOSIS — I10 Essential (primary) hypertension: Secondary | ICD-10-CM | POA: Diagnosis not present

## 2018-03-02 DIAGNOSIS — E119 Type 2 diabetes mellitus without complications: Secondary | ICD-10-CM | POA: Diagnosis not present

## 2018-03-02 DIAGNOSIS — I2510 Atherosclerotic heart disease of native coronary artery without angina pectoris: Secondary | ICD-10-CM | POA: Diagnosis not present

## 2018-03-02 DIAGNOSIS — E782 Mixed hyperlipidemia: Secondary | ICD-10-CM | POA: Diagnosis not present

## 2018-03-02 DIAGNOSIS — K7581 Nonalcoholic steatohepatitis (NASH): Secondary | ICD-10-CM | POA: Diagnosis not present

## 2018-03-02 DIAGNOSIS — F5101 Primary insomnia: Secondary | ICD-10-CM | POA: Diagnosis not present

## 2018-03-02 DIAGNOSIS — F429 Obsessive-compulsive disorder, unspecified: Secondary | ICD-10-CM | POA: Diagnosis not present

## 2018-03-02 DIAGNOSIS — K219 Gastro-esophageal reflux disease without esophagitis: Secondary | ICD-10-CM | POA: Diagnosis not present

## 2018-03-09 ENCOUNTER — Ambulatory Visit
Admission: RE | Admit: 2018-03-09 | Discharge: 2018-03-09 | Disposition: A | Discharge status: Home / Self Care | Payer: PPO | Source: Ambulatory Visit | Attending: Family Medicine | Admitting: Family Medicine

## 2018-03-09 DIAGNOSIS — Z1231 Encounter for screening mammogram for malignant neoplasm of breast: Secondary | ICD-10-CM | POA: Diagnosis not present

## 2018-03-14 DIAGNOSIS — Z0000 Encounter for general adult medical examination without abnormal findings: Secondary | ICD-10-CM | POA: Diagnosis not present

## 2018-03-17 ENCOUNTER — Ambulatory Visit: Payer: PPO | Admitting: Cardiology

## 2018-04-02 NOTE — Progress Notes (Signed)
Cardiology Office Note:    Date:  04/04/2018   ID:  Elaine Roach, DOB [birth year], MRN [id number]  PCP:  Algis Greenhouse, MD  Cardiologist:  Shirlee More, MD   Referring MD: Algis Greenhouse, MD  ASSESSMENT:    1. Angina pectoris ([place])   2. Essential hypertension   3. Mixed hyperlipidemia    PLAN:    In order of problems listed above:  1. She is having symptoms that appear to be typical angina class II to class III we reviewed ischemia evaluation either traditional stress test modalities or cardiac CTA and she opts for cardiac CTA for definitive diagnosis will be scheduled to see her back in my office in 2 months. 2. Stable continue current treatment with ARB diuretic 3. Stable continue statin recent lab work was ideal with an LDL of 64 cholesterol 130 HDL 46 triglycerides 177 A1c six-point  Next appointment 2 months   Medication Adjustments/Labs and Tests Ordered: Current medicines are reviewed at length with the patient today.  Concerns regarding medicines are outlined above.  Orders Placed This Encounter  Procedures  . CT CORONARY MORPH W/CTA COR W/SCORE W/CA W/CM &/OR WO/CM  . CT CORONARY FRACTIONAL FLOW RESERVE DATA PREP  . CT CORONARY FRACTIONAL FLOW RESERVE FLUID ANALYSIS  . EKG 12-Lead   No orders of the defined types were placed in this encounter.    Chief Complaint  Patient presents with  . Chest Pain  . Shortness of Breath    History of Present Illness:    Elaine Roach is a 67 y.o. female who is being seen today for the evaluation of CAD at the request of Dough, Jaymes Graff, MD. She is concerned because she has been told in the past she has atherosclerosis from carotid imaging.  Prior to last summer she was walking daily and now she is developed exercise intolerance weakness exertional shortness of breath and precordial chest tightness with more than usual activities even occurring sometimes with housework.  There is no radiation of this but she is short  of breath no diaphoresis nausea or vomiting the symptoms are mild to moderate in severity but have been progressive and now become limiting.  She has no known heart disease congenital rheumatic.  Past Medical History:  Diagnosis Date  . Arthritis   . Complication of anesthesia    pt. woke up during hand surgery and colonoscopy, cataract surgery  . Diabetes mellitus without complication ([place])    type 2    dx 10 yrs ago  . Dysrhythmia    BENIGN PVC'S  . GERD (gastroesophageal reflux disease)   . Headache   . History of OCD (obsessive compulsive disorder)    TO A DEGREE  . Hypertension   . Insomnia   . Nonalcoholic steatohepatitis ([facility])    DX IN 1995  . Osteoarthritis   . Pneumonia    walking pneu. in the past  . PONV (postoperative nausea and vomiting)     Past Surgical History:  Procedure Laterality Date  . ANTERIOR CERVICAL DECOMP/DISCECTOMY FUSION  07/19/2017   Procedure: ANTERIOR CERVICAL DECOMPRESSION/DISCECTOMY FUSION , INTERBODY PROSTHESIS, ANTERIOR PLATE CERVICAL SIX- CERVICAL SEVEN, CERVICAL SEVEN- THORACIC ONE, EXPLORE OLD FUSION;  Surgeon: Newman Pies, MD;  Location: [facility];  Service: Neurosurgery;;  . BACK SURGERY     x3  . BUNIONECTOMY     RIGHT  . CARPAL TUNNEL RELEASE     RIGHT  . CHOLECYSTECTOMY    . GANGLION CYST EXCISION  Right   . MYELOGRAM    . SHOULDER ARTHROSCOPY W/ ROTATOR CUFF REPAIR     4 SCOPES.Marland KitchenMarland KitchenALL ON THE RIGHT  . TUBAL LIGATION    . WOUND EXPLORATION  03/19/2015   CERVICAL WOUND   . WOUND EXPLORATION N/A 03/19/2015   Procedure: CERVICAL WOUND EXPLORATION, EVACUATION OF CERVICAL HEMATOMA;  Surgeon: Newman Pies, MD;  Location: [facility];  Service: Neurosurgery;  Laterality: N/A;  cervical wound exploration, evacuation of cervical hematoma    Current Medications: Current Meds  Medication Sig  . aspirin EC 81 MG tablet Take 1 tablet by mouth daily.  Marland Kitchen atorvastatin (LIPITOR) 40 MG tablet Take 40 mg by mouth daily at 6 PM.  . cetirizine  (ZYRTEC) 10 MG tablet Take 10 mg by mouth daily.  . Cholecalciferol (VITAMIN D3) 5000 units CAPS Take 5,000 Units by mouth daily.  Marland Kitchen glucose blood (PRECISION QID TEST) test strip 1 strip by Misc.(Non-Drug; Combo Route) route daily.  . irbesartan-hydrochlorothiazide (AVALIDE) 300-12.5 MG tablet Take 1 tablet by mouth daily.   . metFORMIN (GLUCOPHAGE) 500 MG tablet Take 500 mg by mouth 2 (two) times daily with a meal.  . Multiple Vitamin (MULTIVITAMIN WITH MINERALS) TABS tablet Take 1 tablet by mouth daily.  Marland Kitchen omeprazole (PRILOSEC) 40 MG capsule Take 40 mg by mouth every morning.   . sertraline (ZOLOFT) 100 MG tablet Take 100 mg by mouth at bedtime.  . Vitamins/Minerals TABS Take 1 tablet by mouth daily.  Marland Kitchen zolpidem (AMBIEN) 10 MG tablet Take 1 tablet by mouth daily.     Allergies:   Codeine; Lisinopril; and Tape   Social History   Socioeconomic History  . Marital status: Divorced    Spouse name: Not on file  . Number of children: Not on file  . Years of education: Not on file  . Highest education level: Not on file  Occupational History  . Not on file  Social Needs  . Financial resource strain: Not on file  . Food insecurity:    Worry: Not on file    Inability: Not on file  . Transportation needs:    Medical: Not on file    Non-medical: Not on file  Tobacco Use  . Smoking status: Former Smoker    Packs/day: 2.00    Years: 17.00    Pack years: 34.00    Types: Cigarettes  . Smokeless tobacco: Never Used  . Tobacco comment: quit when she was 35  Substance and Sexual Activity  . Alcohol use: Yes    Comment: OCCASIONAL  WINE  . Drug use: No  . Sexual activity: Not on file  Lifestyle  . Physical activity:    Days per week: Not on file    Minutes per session: Not on file  . Stress: Not on file  Relationships  . Social connections:    Talks on phone: Not on file    Gets together: Not on file    Attends religious service: Not on file    Active member of club or  organization: Not on file    Attends meetings of clubs or organizations: Not on file    Relationship status: Not on file  Other Topics Concern  . Not on file  Social History Narrative  . Not on file     Family History: The patient's family history includes Breast cancer in her mother; Heart Problems in her father; Heart failure in her paternal grandfather and paternal grandmother.  ROS:   Review of Systems  Constitution: Positive for malaise/fatigue.  HENT: Negative.   Eyes: Negative.   Cardiovascular: Positive for chest pain and dyspnea on exertion.  Respiratory: Positive for shortness of breath.   Endocrine: Negative.   Hematologic/Lymphatic: Negative.   Skin: Negative.   Musculoskeletal: Positive for neck pain.  Gastrointestinal: Negative.   Genitourinary: Negative.   Neurological: Negative.   Psychiatric/Behavioral: Negative.   Allergic/Immunologic: Negative.    Please see the history of present illness.     All other systems reviewed and are negative.  EKGs/Labs/Other Studies Reviewed:    The following studies were reviewed today:   EKG:  EKG is  ordered today.  The ekg ordered today demonstrates [place] normal  Recent Labs: 03/02/2018 CMP was normal cholesterol 142 LDL 69 HDL 49 07/15/2017: Hemoglobin 12.4; Platelets 275 07/23/2017: ALT 181; BUN 12; Creatinine, Ser 0.85; Potassium 3.9; Sodium 137  Recent Lipid Panel No results found for: CHOL, TRIG, HDL, CHOLHDL, VLDL, LDLCALC, LDLDIRECT  Physical Exam:    VS:  BP 120/68 (BP Location: Left Arm, Patient Position: Sitting, Cuff Size: Normal)   Pulse 76   Ht 5' 4"  (1.626 m)   Wt 178 lb (80.7 kg)   SpO2 98%   BMI 30.55 kg/m     Wt Readings from Last 3 Encounters:  04/04/18 178 lb (80.7 kg)  07/23/17 175 lb 7.8 oz (79.6 kg)  07/19/17 174 lb (78.9 kg)     GEN:  Well nourished, well developed in no acute distress HEENT: Normal NECK: No JVD; No carotid bruits LYMPHATICS: No lymphadenopathy CARDIAC: RRR, no  murmurs, rubs, gallops RESPIRATORY:  Clear to auscultation without rales, wheezing or rhonchi  ABDOMEN: Soft, non-tender, non-distended MUSCULOSKELETAL:  No edema; No deformity  SKIN: Warm and dry NEUROLOGIC:  Alert and oriented x 3 PSYCHIATRIC:  Normal affect     Signed, Shirlee More, MD  04/04/2018 3:40 PM    [facility]

## 2018-04-04 ENCOUNTER — Ambulatory Visit (INDEPENDENT_AMBULATORY_CARE_PROVIDER_SITE_OTHER): Payer: PPO | Admitting: Cardiology

## 2018-04-04 ENCOUNTER — Encounter: Payer: Self-pay | Admitting: Cardiology

## 2018-04-04 VITALS — BP 120/68 | HR 76 | Ht 64.0 in | Wt 178.0 lb

## 2018-04-04 DIAGNOSIS — I209 Angina pectoris, unspecified: Secondary | ICD-10-CM | POA: Insufficient documentation

## 2018-04-04 DIAGNOSIS — Z8679 Personal history of other diseases of the circulatory system: Secondary | ICD-10-CM | POA: Insufficient documentation

## 2018-04-04 DIAGNOSIS — E782 Mixed hyperlipidemia: Secondary | ICD-10-CM

## 2018-04-04 DIAGNOSIS — I10 Essential (primary) hypertension: Secondary | ICD-10-CM

## 2018-04-04 MED ORDER — METOPROLOL TARTRATE 50 MG PO TABS: 50.0000 mg | ORAL_TABLET | Freq: Once | ORAL | 0 refills | Status: DC

## 2018-04-04 MED ORDER — METOPROLOL TARTRATE 50 MG PO TABS: 50.0000 mg | ORAL_TABLET | Freq: Two times a day (BID) | ORAL | 0 refills | Status: DC

## 2018-04-04 NOTE — Patient Instructions (Addendum)
Medication Instructions:  Your physician recommends that you continue on your current medications as directed. Please refer to the Current Medication list given to you today.  If you need a refill on your cardiac medications before your next appointment, please call your pharmacy.   Lab work: Your physician recommends that you return for lab work within 3-7 days before cardiac CTA: BMP. Please return to our office for lab work to be done, no appointment needed. No need to fast beforehand.   If you have labs (blood work) drawn today and your tests are completely normal, you will receive your results only by: Marland Kitchen MyChart Message (if you have MyChart) OR . A paper copy in the mail If you have any lab test that is abnormal or we need to change your treatment, we will call you to review the results.  Testing/Procedures: You had an EKG today.   Your physician has requested that you have cardiac CT. Cardiac computed tomography (CT) is a painless test that uses an x-ray machine to take clear, detailed pictures of your heart. For further information please visit [facility]uge[employer]tn. Please follow instruction sheet as given.   Please arrive at the [facility] main entrance of [facility] at xx:xx AM (30-45 minutes prior to test start time)  [facility] [address] [place], [place] [address] [phone]  Proceed to the [facility] Radiology Department (First Floor).  Please follow these instructions carefully (unless otherwise directed):  On the Night Before the Test: . Be sure to Drink plenty of water. . Do not consume any caffeinated/decaffeinated beverages or chocolate 12 hours prior to your test. . Do not take any antihistamines 12 hours prior to your test. . If you take Metformin do not take 24 hours prior to test.  On the Day of the Test: . Drink plenty of water. Do not drink any water within one hour of the test. . Do not eat any food 4 hours prior to the  test. . You may take your regular medications prior to the test.  . Take metoprolol (Lopressor) two hours prior to test. . HOLD irbesartan-hydrochlorothiazide the morning of the test until testing is complete.   *For Clinical Staff only. Please instruct patient the following:*                 -If HR is less than 55 BPM- No Beta Blocker                -IF HR is greater than 55 BPM and patient is less than or equal to 91 yrs old Lopressor 114m x1.                -If HR is greater than 55 BPM and patient is greater than 755yrs old Lopressor 50 mg x1.         After the Test: . Drink plenty of water. . After receiving IV contrast, you may experience a mild flushed feeling. This is normal. . On occasion, you may experience a mild rash up to 24 hours after the test. This is not dangerous. If this occurs, you can take Benadryl 25 mg and increase your fluid intake. . If you experience trouble breathing, this can be serious. If it is severe call 911 IMMEDIATELY. If it is mild, please call our office. . If you take any of these medications: Glipizide/Metformin, Avandament, Glucavance, please do not take 48 hours after completing test.   Follow-Up: At [facility] you  and your health needs are our priority.  As part of our continuing mission to provide you with exceptional heart care, we have created designated Provider Care Teams.  These Care Teams include your primary Cardiologist (physician) and Advanced Practice Providers (APPs -  Physician Assistants and Nurse Practitioners) who all work together to provide you with the care you need, when you need it. . You will need a follow up appointment in 2 months.  Please call our office 2 months in advance to schedule this appointment.      Cardiac CT Angiogram A cardiac CT angiogram is a procedure to look at the heart and the area around the heart. It may be done to help find the cause of chest pains or other symptoms of heart disease. During this  procedure, a large X-ray machine, called a CT scanner, takes detailed pictures of the heart and the surrounding area after a dye (contrast material) has been injected into blood vessels in the area. The procedure is also sometimes called a coronary CT angiogram, coronary artery scanning, or CTA. A cardiac CT angiogram allows the health care provider to see how well blood is flowing to and from the heart. The health care provider will be able to see if there are any problems, such as:  Blockage or narrowing of the coronary arteries in the heart.  Fluid around the heart.  Signs of weakness or disease in the muscles, valves, and tissues of the heart.  Tell a health care provider about:  Any allergies you have. This is especially important if you have had a previous allergic reaction to contrast dye.  All medicines you are taking, including vitamins, herbs, eye drops, creams, and over-the-counter medicines.  Any blood disorders you have.  Any surgeries you have had.  Any medical conditions you have.  Whether you are pregnant or may be pregnant.  Any anxiety disorders, chronic pain, or other conditions you have that may increase your stress or prevent you from lying still. What are the risks? Generally, this is a safe procedure. However, problems may occur, including:  Bleeding.  Infection.  Allergic reactions to medicines or dyes.  Damage to other structures or organs.  Kidney damage from the dye or contrast that is used.  Increased risk of cancer from radiation exposure. This risk is low. Talk with your health care provider about: ? The risks and benefits of testing. ? How you can receive the lowest dose of radiation.  What happens before the procedure?  Wear comfortable clothing and remove any jewelry, glasses, dentures, and hearing aids.  Follow instructions from your health care provider about eating and drinking. This may include: ? For 12 hours before the test - avoid  caffeine. This includes tea, coffee, soda, energy drinks, and diet pills. Drink plenty of water or other fluids that do not have caffeine in them. Being well-hydrated can prevent complications. ? For 4-6 hours before the test - stop eating and drinking. The contrast dye can cause nausea, but this is less likely if your stomach is empty.  Ask your health care provider about changing or stopping your regular medicines. This is especially important if you are taking diabetes medicines, blood thinners, or medicines to treat erectile dysfunction. What happens during the procedure?  Hair on your chest may need to be removed so that small sticky patches called electrodes can be placed on your chest. These will transmit information that helps to monitor your heart during the test.  An IV  tube will be inserted into one of your veins.  You might be given a medicine to control your heart rate during the test. This will help to ensure that good images are obtained.  You will be asked to lie on an exam table. This table will slide in and out of the CT machine during the procedure.  Contrast dye will be injected into the IV tube. You might feel warm, or you may get a metallic taste in your mouth.  You will be given a medicine (nitroglycerin) to relax (dilate) the arteries in your heart.  The table that you are lying on will move into the CT machine tunnel for the scan.  The person running the machine will give you instructions while the scans are being done. You may be asked to: ? Keep your arms above your head. ? Hold your breath. ? Stay very still, even if the table is moving.  When the scanning is complete, you will be moved out of the machine.  The IV tube will be removed. The procedure may vary among health care providers and hospitals. What happens after the procedure?  You might feel warm, or you may get a metallic taste in your mouth from the contrast dye.  You may have a headache from the  nitroglycerin.  After the procedure, drink water or other fluids to wash (flush) the contrast material out of your body.  Contact a health care provider if you have any symptoms of allergy to the contrast. These symptoms include: ? Shortness of breath. ? Rash or hives. ? A racing heartbeat.  Most people can return to their normal activities right after the procedure. Ask your health care provider what activities are safe for you.  It is up to you to get the results of your procedure. Ask your health care provider, or the department that is doing the procedure, when your results will be ready. Summary  A cardiac CT angiogram is a procedure to look at the heart and the area around the heart. It may be done to help find the cause of chest pains or other symptoms of heart disease.  During this procedure, a large X-ray machine, called a CT scanner, takes detailed pictures of the heart and the surrounding area after a dye (contrast material) has been injected into blood vessels in the area.  Ask your health care provider about changing or stopping your regular medicines before the procedure. This is especially important if you are taking diabetes medicines, blood thinners, or medicines to treat erectile dysfunction.  After the procedure, drink water or other fluids to wash (flush) the contrast material out of your body. This information is not intended to replace advice given to you by your health care provider. Make sure you discuss any questions you have with your health care provider. Document Released: 05/14/2008 Document Revised: 04/20/2016 Document Reviewed: 04/20/2016 Elsevier Interactive Patient Education  2017 [employer].

## 2018-04-14 ENCOUNTER — Telehealth: Payer: Self-pay | Admitting: *Deleted

## 2018-04-14 NOTE — Telephone Encounter (Signed)
Pt would like to know status of angiogram that was ordered. Pt states pressure in chest is increasing along with shortness of breath.

## 2018-04-15 ENCOUNTER — Other Ambulatory Visit: Payer: Self-pay | Admitting: *Deleted

## 2018-04-15 DIAGNOSIS — I10 Essential (primary) hypertension: Secondary | ICD-10-CM

## 2018-04-15 NOTE — Telephone Encounter (Signed)
Reached out to cardiac CTA scheduler, Elaine Roach, to check on the status of patient's testing. Patient has been scheduled for 04/21/18. Patient notified and advised to come to our office on Monday, 04/18/18 to have a BMP drawn, no appointment needed. Patient verbalized understanding. No further questions.

## 2018-04-18 ENCOUNTER — Other Ambulatory Visit: Payer: Self-pay

## 2018-04-18 DIAGNOSIS — I10 Essential (primary) hypertension: Secondary | ICD-10-CM | POA: Diagnosis not present

## 2018-04-18 LAB — BASIC METABOLIC PANEL
BUN/Creatinine Ratio: 20 (ref 12–28)
BUN: 18 mg/dL (ref 8–27)
CO2: 22 mmol/L (ref 20–29)
Calcium: 10.1 mg/dL (ref 8.7–10.3)
Chloride: 99 mmol/L (ref 96–106)
Creatinine, Ser: 0.91 mg/dL (ref 0.57–1.00)
GFR calc Af Amer: 76 mL/min/{1.73_m2} (ref >59)
GFR calc non Af Amer: 65 mL/min/{1.73_m2} (ref >59)
Glucose: 126 mg/dL — ABNORMAL HIGH (ref 65–99)
Potassium: 4.2 mmol/L (ref 3.5–5.2)
Sodium: 141 mmol/L (ref 134–144)

## 2018-04-21 ENCOUNTER — Ambulatory Visit (HOSPITAL_COMMUNITY)
Admission: RE | Admit: 2018-04-21 | Discharge: 2018-04-21 | Disposition: A | Discharge status: Home / Self Care | Payer: PPO | Source: Ambulatory Visit | Attending: Cardiology | Admitting: Cardiology

## 2018-04-21 DIAGNOSIS — I209 Angina pectoris, unspecified: Secondary | ICD-10-CM

## 2018-04-21 MED ORDER — NITROGLYCERIN 0.4 MG SL SUBL
SUBLINGUAL_TABLET | SUBLINGUAL | Status: AC
  Filled 2018-04-21: qty 2

## 2018-04-21 MED ORDER — IOPAMIDOL (ISOVUE-370) INJECTION 76%
100.0000 mL | Freq: Once | INTRAVENOUS | Status: AC | PRN
  Administered 2018-04-21: 80 mL via INTRAVENOUS

## 2018-04-21 MED ORDER — NITROGLYCERIN 0.4 MG SL SUBL
0.8000 mg | SUBLINGUAL_TABLET | Freq: Once | SUBLINGUAL | Status: AC
  Administered 2018-04-21: 0.8 mg via SUBLINGUAL
  Filled 2018-04-21: qty 25

## 2018-04-22 DIAGNOSIS — I209 Angina pectoris, unspecified: Secondary | ICD-10-CM | POA: Diagnosis not present

## 2018-04-25 NOTE — Progress Notes (Signed)
Cardiology Office Note:    Date:  04/26/2018   ID:  Elaine Roach, DOB 1950/08/14, MRN [id number]  PCP:  Algis Greenhouse, MD  Cardiologist:  Shirlee More, MD    Referring MD: Algis Greenhouse, MD    ASSESSMENT:    1. Coronary artery disease involving native coronary artery of native heart with angina pectoris ([place])   2. Essential hypertension   3. Mixed hyperlipidemia   4. Pre-op evaluation    PLAN:    In order of problems listed above:  1. For coronary angiography see discussion below abnormal CTA flow-limiting stenosis left circumflex artery artery.  I did add a rate limiting calcium channel blocker to aspirin and statin as well as nitroglycerin as needed 2. Stable continue treatment including ARB 3. Stable continue high intensity statin   Next appointment: 1 month   Medication Adjustments/Labs and Tests Ordered: Current medicines are reviewed at length with the patient today.  Concerns regarding medicines are outlined above.  Orders Placed This Encounter  Procedures  . DG Chest 2 View  . CBC  . Basic Metabolic Panel (BMET)   Meds ordered this encounter  Medications  . diltiazem (CARDIZEM CD) 240 MG 24 hr capsule    Sig: Take 1 capsule (240 mg total) by mouth daily.    Dispense:  30 capsule    Refill:  3  . nitroGLYCERIN (NITROSTAT) 0.4 MG SL tablet    Sig: Place 1 tablet (0.4 mg total) under the tongue every 5 (five) minutes as needed for chest pain.    Dispense:  25 tablet    Refill:  11    No chief complaint on file.   History of Present Illness:    Elaine Roach is a 67 y.o. female with a hx of CAD with recent cardiac CTA.  She has less than 50% stenosis distal left main coronary artery less than 50% proximal LAD stenosis less than 50% proximal right coronary artery stenosis and has stenosis in the left circumflex felt to be less than 50% however FFR indicates hemodynamically significant stenosis 0.63.  She was last seen 04/04/2018.  At that time she  was having typical angina class II to class III along with hypertension and dyslipidemia. Compliance with diet, lifestyle and medications: Yes  She is seen by me in the office after recent cardiac CTA showing multivessel CAD and with fractional flow reserve significant stenosis in the left circumflex coronary artery.  In particular she is unhappy with the quality of her life she has exertional shortness of breath and chest tightness with activities like walking into the office from the car but not at rest or nocturnal.  Pattern is stable.  We reviewed the options of additional medical therapy or referral to coronary angiography with class III symptoms and she elects after informed consent to undergo coronary angiography and percutaneous intervention if appropriate benefits risk and options were detailed she has no allergy to dye contraindication to dual antiplatelet therapy or pending surgical interventions.  Her chest pain is typical angina burning tightness in the chest physical effort relieved in a few minutes with rest.  The pattern has not progressed but it is limiting in life disruptive. Past Medical History:  Diagnosis Date  . Arthritis   . Complication of anesthesia    pt. woke up during hand surgery and colonoscopy, cataract surgery  . Diabetes mellitus without complication ([place])    type 2    dx 10 yrs ago  . Dysrhythmia  BENIGN PVC'S  . GERD (gastroesophageal reflux disease)   . Headache   . History of OCD (obsessive compulsive disorder)    TO A DEGREE  . Hypertension   . Insomnia   . Nonalcoholic steatohepatitis ([facility])    DX IN 1995  . Osteoarthritis   . Pneumonia    walking pneu. in the past  . PONV (postoperative nausea and vomiting)     Past Surgical History:  Procedure Laterality Date  . ANTERIOR CERVICAL DECOMP/DISCECTOMY FUSION  07/19/2017   Procedure: ANTERIOR CERVICAL DECOMPRESSION/DISCECTOMY FUSION , INTERBODY PROSTHESIS, ANTERIOR PLATE CERVICAL SIX- CERVICAL SEVEN,  CERVICAL SEVEN- THORACIC ONE, EXPLORE OLD FUSION;  Surgeon: Newman Pies, MD;  Location: [facility];  Service: Neurosurgery;;  . BACK SURGERY     x3  . BUNIONECTOMY     RIGHT  . CARPAL TUNNEL RELEASE     RIGHT  . CHOLECYSTECTOMY    . GANGLION CYST EXCISION Right   . MYELOGRAM    . SHOULDER ARTHROSCOPY W/ ROTATOR CUFF REPAIR     4 SCOPES.Marland KitchenMarland KitchenALL ON THE RIGHT  . TUBAL LIGATION    . WOUND EXPLORATION  03/19/2015   CERVICAL WOUND   . WOUND EXPLORATION N/A 03/19/2015   Procedure: CERVICAL WOUND EXPLORATION, EVACUATION OF CERVICAL HEMATOMA;  Surgeon: Newman Pies, MD;  Location: [facility];  Service: Neurosurgery;  Laterality: N/A;  cervical wound exploration, evacuation of cervical hematoma    Current Medications: Current Meds  Medication Sig  . aspirin EC 81 MG tablet Take 1 tablet by mouth daily.  Marland Kitchen atorvastatin (LIPITOR) 40 MG tablet Take 40 mg by mouth daily at 6 PM.  . cetirizine (ZYRTEC) 10 MG tablet Take 10 mg by mouth daily.  . Cholecalciferol (VITAMIN D3) 5000 units CAPS Take 5,000 Units by mouth daily.  . Coenzyme Q10 (CO Q 10 PO) Take 1 capsule by mouth daily.  . Cyanocobalamin (VITAMIN B-12 PO) Take 1 tablet by mouth daily.  Marland Kitchen glucose blood (PRECISION QID TEST) test strip 1 strip by Misc.(Non-Drug; Combo Route) route daily.  . metFORMIN (GLUCOPHAGE) 500 MG tablet Take 500 mg by mouth 2 (two) times daily with a meal.  . Multiple Vitamin (MULTIVITAMIN WITH MINERALS) TABS tablet Take 1 tablet by mouth daily.  Marland Kitchen omeprazole (PRILOSEC) 40 MG capsule Take 40 mg by mouth every morning.   . sertraline (ZOLOFT) 100 MG tablet Take 100 mg by mouth at bedtime.  Marland Kitchen zolpidem (AMBIEN) 10 MG tablet Take 1 tablet by mouth daily.     Allergies:   Codeine; Lisinopril; and Tape   Social History   Socioeconomic History  . Marital status: Divorced    Spouse name: Not on file  . Number of children: Not on file  . Years of education: Not on file  . Highest education level: Not on file    Occupational History  . Not on file  Social Needs  . Financial resource strain: Not on file  . Food insecurity:    Worry: Not on file    Inability: Not on file  . Transportation needs:    Medical: Not on file    Non-medical: Not on file  Tobacco Use  . Smoking status: Former Smoker    Packs/day: 2.00    Years: 17.00    Pack years: 34.00    Types: Cigarettes  . Smokeless tobacco: Never Used  . Tobacco comment: quit when she was 35  Substance and Sexual Activity  . Alcohol use: Yes    Comment: OCCASIONAL  WINE  . Drug use: No  . Sexual activity: Not on file  Lifestyle  . Physical activity:    Days per week: Not on file    Minutes per session: Not on file  . Stress: Not on file  Relationships  . Social connections:    Talks on phone: Not on file    Gets together: Not on file    Attends religious service: Not on file    Active member of club or organization: Not on file    Attends meetings of clubs or organizations: Not on file    Relationship status: Not on file  Other Topics Concern  . Not on file  Social History Narrative  . Not on file     Family History: The patient's family history includes Breast cancer in her mother; Heart Problems in her father; Heart failure in her paternal grandfather and paternal grandmother. ROS:   Please see the history of present illness.    All other systems reviewed and are negative.  EKGs/Labs/Other Studies Reviewed:    The following studies were reviewed today:   Recent Labs: 07/15/2017: Hemoglobin 12.4; Platelets 275 07/23/2017: ALT 181 04/18/2018: BUN 18; Creatinine, Ser 0.91; Potassium 4.2; Sodium 141  Recent Lipid Panel   03/02/2018 cholesterol 142 LDL 69 HDL 49 non-HDL cholesterol 93 No results found for: CHOL, TRIG, HDL, CHOLHDL, VLDL, LDLCALC, LDLDIRECT  Physical Exam:    VS:  BP (!) 148/70 (BP Location: Left Arm, Patient Position: Sitting, Cuff Size: Normal)   Pulse 82   Ht 5' 4"  (1.626 m)   Wt 181 lb 3.2 oz (82.2  kg)   SpO2 97%   BMI 31.10 kg/m     Wt Readings from Last 3 Encounters:  04/26/18 181 lb 3.2 oz (82.2 kg)  04/04/18 178 lb (80.7 kg)  07/23/17 175 lb 7.8 oz (79.6 kg)     GEN:  Well nourished, well developed in no acute distress HEENT: Normal NECK: No JVD; No carotid bruits LYMPHATICS: No lymphadenopathy CARDIAC: RRR, no murmurs, rubs, gallops RESPIRATORY:  Clear to auscultation without rales, wheezing or rhonchi  ABDOMEN: Soft, non-tender, non-distended MUSCULOSKELETAL:  No edema; No deformity  SKIN: Warm and dry NEUROLOGIC:  Alert and oriented x 3 PSYCHIATRIC:  Normal affect    Signed, Shirlee More, MD  04/26/2018 11:12 AM    [place]

## 2018-04-26 ENCOUNTER — Encounter: Payer: Self-pay | Admitting: Cardiology

## 2018-04-26 ENCOUNTER — Ambulatory Visit (INDEPENDENT_AMBULATORY_CARE_PROVIDER_SITE_OTHER): Payer: PPO | Admitting: Cardiology

## 2018-04-26 ENCOUNTER — Other Ambulatory Visit: Payer: Self-pay

## 2018-04-26 VITALS — BP 148/70 | HR 82 | Ht 64.0 in | Wt 181.2 lb

## 2018-04-26 DIAGNOSIS — Z01818 Encounter for other preprocedural examination: Secondary | ICD-10-CM | POA: Insufficient documentation

## 2018-04-26 DIAGNOSIS — I25119 Atherosclerotic heart disease of native coronary artery with unspecified angina pectoris: Secondary | ICD-10-CM | POA: Diagnosis not present

## 2018-04-26 DIAGNOSIS — I10 Essential (primary) hypertension: Secondary | ICD-10-CM

## 2018-04-26 DIAGNOSIS — E782 Mixed hyperlipidemia: Secondary | ICD-10-CM | POA: Diagnosis not present

## 2018-04-26 LAB — BASIC METABOLIC PANEL
BUN/Creatinine Ratio: 15 (ref 12–28)
BUN: 14 mg/dL (ref 8–27)
CO2: 25 mmol/L (ref 20–29)
Calcium: 9.9 mg/dL (ref 8.7–10.3)
Chloride: 103 mmol/L (ref 96–106)
Creatinine, Ser: 0.91 mg/dL (ref 0.57–1.00)
GFR calc Af Amer: 76 mL/min/{1.73_m2} (ref >59)
GFR calc non Af Amer: 65 mL/min/{1.73_m2} (ref >59)
Glucose: 109 mg/dL — ABNORMAL HIGH (ref 65–99)
Potassium: 5.1 mmol/L (ref 3.5–5.2)
Sodium: 144 mmol/L (ref 134–144)

## 2018-04-26 LAB — CBC
Hematocrit: 33.5 % — ABNORMAL LOW (ref 34.0–46.6)
Hemoglobin: 11.3 g/dL (ref 11.1–15.9)
MCH: 26.9 pg (ref 26.6–33.0)
MCHC: 33.7 g/dL (ref 31.5–35.7)
MCV: 80 fL (ref 79–97)
Platelets: 231 10*3/uL (ref 150–450)
RBC: 4.2 x10E6/uL (ref 3.77–5.28)
RDW: 14.8 % (ref 12.3–15.4)
WBC: 5.6 10*3/uL (ref 3.4–10.8)

## 2018-04-26 MED ORDER — NITROGLYCERIN 0.4 MG SL SUBL: 0.4000 mg | SUBLINGUAL_TABLET | SUBLINGUAL | 11 refills | Status: DC | PRN

## 2018-04-26 MED ORDER — DILTIAZEM HCL ER COATED BEADS 240 MG PO CP24: 240.0000 mg | ORAL_CAPSULE | Freq: Every day | ORAL | 3 refills | Status: DC

## 2018-04-26 NOTE — Patient Instructions (Signed)
Medication Instructions:  Your physician has recommended you make the following change in your medication:   START diltiazem (cardizem CD) 240 mg: Take 1 capsule daily   START nitroglycerin as needed for chest pain: When having chest pain, stop what you are doing and sit down. Take 1 nitro, wait 5 minutes. Still having chest pain, take 1 nitro, wait 5 minutes. Still having chest pain, take 1 nitro, dial 911. Total of 3 nitro in 15 minutes.  If you need a refill on your cardiac medications before your next appointment, please call your pharmacy.   Lab work: Your physician recommends that you return for lab work today: STAT CBC, BMP.   If you have labs (blood work) drawn today and your tests are completely normal, you will receive your results only by: Marland Kitchen MyChart Message (if you have MyChart) OR . A paper copy in the mail If you have any lab test that is abnormal or we need to change your treatment, we will call you to review the results.  Testing/Procedures:    [place] [address] Dept: [phone] Loc: [phone]  Elaine Roach  04/26/2018  You are scheduled for a Cardiac Catheterization on Wednesday, November 13 with Dr. Glenetta Hew.  1. Please arrive at the [facility] (Main Entrance A) at [facility]: [address] [place], [place] [address] at 11:00 AM (This time is two hours before your procedure to ensure your preparation). Free valet parking service is available.   Special note: Every effort is made to have your procedure done on time. Please understand that emergencies sometimes delay scheduled procedures.  2. Diet: Do not eat solid foods after midnight.  The patient may have clear liquids until 5am upon the day of the procedure.  3. Labs: None needed  4. Medication instructions in preparation for your procedure:   Contrast Allergy: No   Stop  taking irbesartan-hydrochlorothiazide (avalide) today, 04/26/18 until after catheterization is completed.    Do not take Diabetes Med Glucophage (Metformin) on the day of the procedure and HOLD 48 HOURS AFTER THE PROCEDURE.  On the morning of your procedure, take your Aspirin and any morning medicines NOT listed above.  You may use sips of water.  5. Plan for one night stay--bring personal belongings. 6. Bring a current list of your medications and current insurance cards. 7. You MUST have a responsible person to drive you home. 8. Someone MUST be with you the first 24 hours after you arrive home or your discharge will be delayed. 9. Please wear clothes that are easy to get on and off and wear slip-on shoes.  Thank you for allowing [place] to care for you!   -- [facility]   Follow-Up: At [facility], you and your health needs are our priority.  As part of our continuing mission to provide you with exceptional heart care, we have created designated Provider Care Teams.  These Care Teams include your primary Cardiologist (physician) and Advanced Practice Providers (APPs -  Physician Assistants and Nurse Practitioners) who all work together to provide you with the care you need, when you need it. You will need a follow up appointment in 1 months.  Please call our office 2 months in advance to schedule this appointment.     Diltiazem tablets What is this medicine? DILTIAZEM (dil TYE a zem) is a calcium-channel blocker. It affects the  amount of calcium found in your heart and muscle cells. This relaxes your blood vessels, which can reduce the amount of work the heart has to do. This medicine is used to treat chest pain caused by angina. This medicine may be used for other purposes; ask your health care provider or pharmacist if you have questions. COMMON BRAND NAME(S): Cardizem What should I tell my health care provider before I take this medicine? They need to  know if you have any of these conditions: -heart problems, low blood pressure, irregular heartbeat -liver disease -previous heart attack -an unusual or allergic reaction to diltiazem, other medicines, foods, dyes, or preservatives -pregnant or trying to get pregnant -breast-feeding How should I use this medicine? Take this medicine by mouth with a glass of water. Follow the directions on the prescription label. Do not cut, crush or chew this medicine. This medicine is usually taken before meals and at bedtime. Take your doses at regular intervals. Do not take your medicine more often then directed. Do not stop taking except on the advice of your doctor or health care professional. Talk to your pediatrician regarding the use of this medicine in children. Special care may be needed. Overdosage: If you think you have taken too much of this medicine contact a poison control center or emergency room at once. NOTE: This medicine is only for you. Do not share this medicine with others. What if I miss a dose? If you miss a dose, take it as soon as you can. If it is almost time for your next dose, take only that dose. Do not take double or extra doses. What may interact with this medicine? Do not take this medicine with any of the following: -cisapride -hawthorn -pimozide -ranolazine -red yeast rice This medicine may also interact with the following medications: -buspirone -carbamazepine -cimetidine -cyclosporine -digoxin -local anesthetics or general anesthetics -lovastatin -medicines for anxiety or difficulty sleeping like midazolam and triazolam -medicines for high blood pressure or heart problems -quinidine -rifampin, rifabutin, or rifapentine This list may not describe all possible interactions. Give your health care provider a list of all the medicines, herbs, non-prescription drugs, or dietary supplements you use. Also tell them if you smoke, drink alcohol, or use illegal drugs. Some  items may interact with your medicine. What should I watch for while using this medicine? Check your blood pressure and pulse rate regularly. Ask your doctor or health care professional what your blood pressure and pulse rate should be and when you should contact him or her. You may feel dizzy or lightheaded. Do not drive, use machinery, or do anything that needs mental alertness until you know how this medicine affects you. To reduce the risk of dizzy or fainting spells, do not sit or stand up quickly, especially if you are an older patient. Alcohol can make you more dizzy or increase flushing and rapid heartbeats. Avoid alcoholic drinks. What side effects may I notice from receiving this medicine? Side effects that you should report to your doctor or health care professional as soon as possible: -allergic reactions like skin rash, itching or hives, swelling of the face, lips, or tongue -confusion, mental depression -feeling faint or lightheaded, falls -pinpoint red spots on the skin -redness, blistering, peeling or loosening of the skin, including inside the mouth -slow, irregular heartbeat -swelling of the ankles, feet -unusual bleeding or bruising Side effects that usually do not require medical attention (report to your doctor or health care professional if they continue or are  bothersome): -change in sex drive or performance -constipation or diarrhea -flushing of the face -headache -nausea, vomiting -tired or weak -trouble sleeping This list may not describe all possible side effects. Call your doctor for medical advice about side effects. You may report side effects to FDA at 1-800-FDA-1088. Where should I keep my medicine? Keep out of the reach of children. Store at room temperature between 20 and 25 degrees C (68 and 77 degrees F). Protect from light. Keep container tightly closed. Throw away any unused medicine after the expiration date. NOTE: This sheet is a summary. It may not  cover all possible information. If you have questions about this medicine, talk to your doctor, pharmacist, or health care provider.  2018 Elsevier/Gold Standard (2013-05-15 10:54:31)    Nitroglycerin sublingual tablets What is this medicine? NITROGLYCERIN (nye troe GLI ser in) is a type of vasodilator. It relaxes blood vessels, increasing the blood and oxygen supply to your heart. This medicine is used to relieve chest pain caused by angina. It is also used to prevent chest pain before activities like climbing stairs, going outdoors in cold weather, or sexual activity. This medicine may be used for other purposes; ask your health care provider or pharmacist if you have questions. COMMON BRAND NAME(S): Nitroquick, Nitrostat, Nitrotab What should I tell my health care provider before I take this medicine? They need to know if you have any of these conditions: -anemia -head injury, recent stroke, or bleeding in the brain -liver disease -previous heart attack -an unusual or allergic reaction to nitroglycerin, other medicines, foods, dyes, or preservatives -pregnant or trying to get pregnant -breast-feeding How should I use this medicine? Take this medicine by mouth as needed. At the first sign of an angina attack (chest pain or tightness) place one tablet under your tongue. You can also take this medicine 5 to 10 minutes before an event likely to produce chest pain. Follow the directions on the prescription label. Let the tablet dissolve under the tongue. Do not swallow whole. Replace the dose if you accidentally swallow it. It will help if your mouth is not dry. Saliva around the tablet will help it to dissolve more quickly. Do not eat or drink, smoke or chew tobacco while a tablet is dissolving. If you are not better within 5 minutes after taking ONE dose of nitroglycerin, call 9-1-1 immediately to seek emergency medical care. Do not take more than 3 nitroglycerin tablets over 15 minutes. If you  take this medicine often to relieve symptoms of angina, your doctor or health care professional may provide you with different instructions to manage your symptoms. If symptoms do not go away after following these instructions, it is important to call 9-1-1 immediately. Do not take more than 3 nitroglycerin tablets over 15 minutes. Talk to your pediatrician regarding the use of this medicine in children. Special care may be needed. Overdosage: If you think you have taken too much of this medicine contact a poison control center or emergency room at once. NOTE: This medicine is only for you. Do not share this medicine with others. What if I miss a dose? This does not apply. This medicine is only used as needed. What may interact with this medicine? Do not take this medicine with any of the following medications: -certain migraine medicines like ergotamine and dihydroergotamine (DHE) -medicines used to treat erectile dysfunction like sildenafil, tadalafil, and vardenafil -riociguat This medicine may also interact with the following medications: -alteplase -aspirin -heparin -medicines for high blood  pressure -medicines for mental depression -other medicines used to treat angina -phenothiazines like chlorpromazine, mesoridazine, prochlorperazine, thioridazine This list may not describe all possible interactions. Give your health care provider a list of all the medicines, herbs, non-prescription drugs, or dietary supplements you use. Also tell them if you smoke, drink alcohol, or use illegal drugs. Some items may interact with your medicine. What should I watch for while using this medicine? Tell your doctor or health care professional if you feel your medicine is no longer working. Keep this medicine with you at all times. Sit or lie down when you take your medicine to prevent falling if you feel dizzy or faint after using it. Try to remain calm. This will help you to feel better faster. If you  feel dizzy, take several deep breaths and lie down with your feet propped up, or bend forward with your head resting between your knees. You may get drowsy or dizzy. Do not drive, use machinery, or do anything that needs mental alertness until you know how this drug affects you. Do not stand or sit up quickly, especially if you are an older patient. This reduces the risk of dizzy or fainting spells. Alcohol can make you more drowsy and dizzy. Avoid alcoholic drinks. Do not treat yourself for coughs, colds, or pain while you are taking this medicine without asking your doctor or health care professional for advice. Some ingredients may increase your blood pressure. What side effects may I notice from receiving this medicine? Side effects that you should report to your doctor or health care professional as soon as possible: -blurred vision -dry mouth -skin rash -sweating -the feeling of extreme pressure in the head -unusually weak or tired Side effects that usually do not require medical attention (report to your doctor or health care professional if they continue or are bothersome): -flushing of the face or neck -headache -irregular heartbeat, palpitations -nausea, vomiting This list may not describe all possible side effects. Call your doctor for medical advice about side effects. You may report side effects to FDA at 1-800-FDA-1088. Where should I keep my medicine? Keep out of the reach of children. Store at room temperature between 20 and 25 degrees C (68 and 77 degrees F). Store in [employer]. Protect from light and moisture. Keep tightly closed. Throw away any unused medicine after the expiration date. NOTE: This sheet is a summary. It may not cover all possible information. If you have questions about this medicine, talk to your doctor, pharmacist, or health care provider.  2018 Elsevier/Gold Standard (2013-03-30 17:57:36)      Coronary Angiogram With Stent Coronary  angiogram with stent placement is a procedure to widen or open a narrow blood vessel of the heart (coronary artery). Arteries may become blocked by cholesterol buildup (plaques) in the lining of the wall. When a coronary artery becomes partially blocked, blood flow to that area decreases. This may lead to chest pain or a heart attack (myocardial infarction). A stent is a small piece of metal that looks like mesh or a spring. Stent placement may be done as treatment for a heart attack or right after a coronary angiogram in which a blocked artery is found. Let your health care provider know about:  Any allergies you have.  All medicines you are taking, including vitamins, herbs, eye drops, creams, and over-the-counter medicines.  Any problems you or family members have had with anesthetic medicines.  Any blood disorders you have.  Any surgeries you have had.  Any medical conditions you have.  Whether you are pregnant or may be pregnant. What are the risks? Generally, this is a safe procedure. However, problems may occur, including:  Damage to the heart or its blood vessels.  A return of blockage.  Bleeding, infection, or bruising at the insertion site.  A collection of blood under the skin (hematoma) at the insertion site.  A blood clot in another part of the body.  Kidney injury.  Allergic reaction to the dye or contrast that is used.  Bleeding into the abdomen (retroperitoneal bleeding).  What happens before the procedure? Staying hydrated Follow instructions from your health care provider about hydration, which may include:  Up to 2 hours before the procedure - you may continue to drink clear liquids, such as water, clear fruit juice, black coffee, and plain tea.  Eating and drinking restrictions Follow instructions from your health care provider about eating and drinking, which may include:  8 hours before the procedure - stop eating heavy meals or foods such as meat,  fried foods, or fatty foods.  6 hours before the procedure - stop eating light meals or foods, such as toast or cereal.  2 hours before the procedure - stop drinking clear liquids.  Ask your health care provider about:  Changing or stopping your regular medicines. This is especially important if you are taking diabetes medicines or blood thinners.  Taking medicines such as ibuprofen. These medicines can thin your blood. Do not take these medicines before your procedure if your health care provider instructs you not to. Generally, aspirin is recommended before a procedure of passing a small, thin tube (catheter) through a blood vessel and into the heart (cardiac catheterization).  What happens during the procedure?  An IV tube will be inserted into one of your veins.  You will be given one or more of the following: ? A medicine to help you relax (sedative). ? A medicine to numb the area where the catheter will be inserted into an artery (local anesthetic).  To reduce your risk of infection: ? Your health care team will wash or sanitize their hands. ? Your skin will be washed with soap. ? Hair may be removed from the area where the catheter will be inserted.  Using a guide wire, the catheter will be inserted into an artery. The location may be in your groin, in your wrist, or in the fold of your arm (near your elbow).  A type of X-ray (fluoroscopy) will be used to help guide the catheter to the opening of the arteries in the heart.  A dye will be injected into the catheter, and X-rays will be taken. The dye will help to show where any narrowing or blockages are located in the arteries.  A tiny wire will be guided to the blocked spot, and a balloon will be inflated to make the artery wider.  The stent will be expanded and will crush the plaques into the wall of the vessel. The stent will hold the area open and improve the blood flow. Most stents have a drug coating to reduce the risk of  the stent narrowing over time.  The artery may be made wider using a drill, laser, or other tools to remove plaques.  When the blood flow is better, the catheter will be removed. The lining of the artery will grow over the stent, which stays where it was placed. This procedure may vary among health care providers and hospitals. What happens after  the procedure?  If the procedure is done through the leg, you will be kept in bed lying flat for about 6 hours. You will be instructed to not bend and not cross your legs.  The insertion site will be checked frequently.  The pulse in your foot or wrist will be checked frequently.  You may have additional blood tests, X-rays, and a test that records the electrical activity of your heart (electrocardiogram, or ECG). This information is not intended to replace advice given to you by your health care provider. Make sure you discuss any questions you have with your health care provider. Document Released: 12/06/2002 Document Revised: 01/30/2016 Document Reviewed: 01/05/2016 Elsevier Interactive Patient Education  [employer].

## 2018-04-27 ENCOUNTER — Encounter (HOSPITAL_COMMUNITY)
Admission: RE | Disposition: A | Discharge status: Home / Self Care | Payer: Self-pay | Source: Ambulatory Visit | Attending: Cardiology

## 2018-04-27 ENCOUNTER — Ambulatory Visit (HOSPITAL_COMMUNITY)
Admission: RE | Admit: 2018-04-27 | Discharge: 2018-04-27 | Disposition: A | Discharge status: Home / Self Care | Payer: PPO | Source: Ambulatory Visit | Attending: Cardiology | Admitting: Cardiology

## 2018-04-27 ENCOUNTER — Other Ambulatory Visit: Payer: Self-pay

## 2018-04-27 DIAGNOSIS — E119 Type 2 diabetes mellitus without complications: Secondary | ICD-10-CM | POA: Insufficient documentation

## 2018-04-27 DIAGNOSIS — Z9049 Acquired absence of other specified parts of digestive tract: Secondary | ICD-10-CM | POA: Insufficient documentation

## 2018-04-27 DIAGNOSIS — Z8249 Family history of ischemic heart disease and other diseases of the circulatory system: Secondary | ICD-10-CM | POA: Insufficient documentation

## 2018-04-27 DIAGNOSIS — K7581 Nonalcoholic steatohepatitis (NASH): Secondary | ICD-10-CM | POA: Insufficient documentation

## 2018-04-27 DIAGNOSIS — I429 Cardiomyopathy, unspecified: Secondary | ICD-10-CM | POA: Insufficient documentation

## 2018-04-27 DIAGNOSIS — E782 Mixed hyperlipidemia: Secondary | ICD-10-CM | POA: Diagnosis not present

## 2018-04-27 DIAGNOSIS — Z87891 Personal history of nicotine dependence: Secondary | ICD-10-CM | POA: Diagnosis not present

## 2018-04-27 DIAGNOSIS — R931 Abnormal findings on diagnostic imaging of heart and coronary circulation: Secondary | ICD-10-CM | POA: Diagnosis not present

## 2018-04-27 DIAGNOSIS — M1990 Unspecified osteoarthritis, unspecified site: Secondary | ICD-10-CM | POA: Diagnosis not present

## 2018-04-27 DIAGNOSIS — Z9851 Tubal ligation status: Secondary | ICD-10-CM | POA: Diagnosis not present

## 2018-04-27 DIAGNOSIS — Z981 Arthrodesis status: Secondary | ICD-10-CM | POA: Insufficient documentation

## 2018-04-27 DIAGNOSIS — G4700 Insomnia, unspecified: Secondary | ICD-10-CM | POA: Insufficient documentation

## 2018-04-27 DIAGNOSIS — I2510 Atherosclerotic heart disease of native coronary artery without angina pectoris: Secondary | ICD-10-CM | POA: Diagnosis present

## 2018-04-27 DIAGNOSIS — I2584 Coronary atherosclerosis due to calcified coronary lesion: Secondary | ICD-10-CM | POA: Diagnosis not present

## 2018-04-27 DIAGNOSIS — Z885 Allergy status to narcotic agent status: Secondary | ICD-10-CM | POA: Diagnosis not present

## 2018-04-27 DIAGNOSIS — I209 Angina pectoris, unspecified: Secondary | ICD-10-CM | POA: Clinically undetermined

## 2018-04-27 DIAGNOSIS — I25119 Atherosclerotic heart disease of native coronary artery with unspecified angina pectoris: Secondary | ICD-10-CM | POA: Insufficient documentation

## 2018-04-27 DIAGNOSIS — Z9889 Other specified postprocedural states: Secondary | ICD-10-CM | POA: Diagnosis not present

## 2018-04-27 DIAGNOSIS — Z888 Allergy status to other drugs, medicaments and biological substances status: Secondary | ICD-10-CM | POA: Diagnosis not present

## 2018-04-27 DIAGNOSIS — I10 Essential (primary) hypertension: Secondary | ICD-10-CM | POA: Insufficient documentation

## 2018-04-27 DIAGNOSIS — K219 Gastro-esophageal reflux disease without esophagitis: Secondary | ICD-10-CM | POA: Diagnosis not present

## 2018-04-27 HISTORY — PX: LEFT HEART CATH AND CORONARY ANGIOGRAPHY: CATH118249

## 2018-04-27 LAB — GLUCOSE, CAPILLARY: Glucose-Capillary: 127 mg/dL — ABNORMAL HIGH (ref 70–99)

## 2018-04-27 SURGERY — LEFT HEART CATH AND CORONARY ANGIOGRAPHY
Anesthesia: LOCAL

## 2018-04-27 MED ORDER — IOHEXOL 350 MG/ML SOLN
INTRAVENOUS | Status: DC | PRN
  Administered 2018-04-27: 75 mL via INTRA_ARTERIAL

## 2018-04-27 MED ORDER — LIDOCAINE HCL (PF) 1 % IJ SOLN
INTRAMUSCULAR | Status: DC | PRN
  Administered 2018-04-27: 2 mL

## 2018-04-27 MED ORDER — MIDAZOLAM HCL 2 MG/2ML IJ SOLN
INTRAMUSCULAR | Status: AC
  Filled 2018-04-27: qty 2

## 2018-04-27 MED ORDER — NITROGLYCERIN 1 MG/10 ML FOR IR/CATH LAB
INTRA_ARTERIAL | Status: DC | PRN
  Administered 2018-04-27: 200 ug via INTRACORONARY

## 2018-04-27 MED ORDER — LIDOCAINE HCL (PF) 1 % IJ SOLN
INTRAMUSCULAR | Status: AC
  Filled 2018-04-27: qty 30

## 2018-04-27 MED ORDER — SODIUM CHLORIDE 0.9 % IV SOLN: 250.0000 mL | INTRAVENOUS | Status: DC | PRN

## 2018-04-27 MED ORDER — FENTANYL CITRATE (PF) 100 MCG/2ML IJ SOLN
INTRAMUSCULAR | Status: AC
  Filled 2018-04-27: qty 2

## 2018-04-27 MED ORDER — NITROGLYCERIN 1 MG/10 ML FOR IR/CATH LAB
INTRA_ARTERIAL | Status: AC
  Filled 2018-04-27: qty 10

## 2018-04-27 MED ORDER — SODIUM CHLORIDE 0.9 % WEIGHT BASED INFUSION
3.0000 mL/kg/h | INTRAVENOUS | Status: AC
  Administered 2018-04-27: 3 mL/kg/h via INTRAVENOUS

## 2018-04-27 MED ORDER — MIDAZOLAM HCL 2 MG/2ML IJ SOLN
INTRAMUSCULAR | Status: DC | PRN
  Administered 2018-04-27: 2 mg via INTRAVENOUS
  Administered 2018-04-27 (×2): 1 mg via INTRAVENOUS

## 2018-04-27 MED ORDER — FENTANYL CITRATE (PF) 100 MCG/2ML IJ SOLN
INTRAMUSCULAR | Status: DC | PRN
  Administered 2018-04-27 (×3): 25 ug via INTRAVENOUS

## 2018-04-27 MED ORDER — HEPARIN SODIUM (PORCINE) 1000 UNIT/ML IJ SOLN
INTRAMUSCULAR | Status: DC | PRN
  Administered 2018-04-27: 4000 [IU] via INTRAVENOUS

## 2018-04-27 MED ORDER — SODIUM CHLORIDE 0.9 % WEIGHT BASED INFUSION: 1.0000 mL/kg/h | INTRAVENOUS | Status: DC

## 2018-04-27 MED ORDER — SODIUM CHLORIDE 0.9% FLUSH: 3.0000 mL | Freq: Two times a day (BID) | INTRAVENOUS | Status: DC

## 2018-04-27 MED ORDER — VERAPAMIL HCL 2.5 MG/ML IV SOLN
INTRAVENOUS | Status: AC
  Filled 2018-04-27: qty 2

## 2018-04-27 MED ORDER — HEPARIN (PORCINE) IN NACL 1000-0.9 UT/500ML-% IV SOLN
INTRAVENOUS | Status: DC | PRN
  Administered 2018-04-27 (×2): 500 mL

## 2018-04-27 MED ORDER — SODIUM CHLORIDE 0.9% FLUSH: 3.0000 mL | INTRAVENOUS | Status: DC | PRN

## 2018-04-27 MED ORDER — ASPIRIN 81 MG PO CHEW: 81.0000 mg | CHEWABLE_TABLET | ORAL | Status: DC

## 2018-04-27 MED ORDER — VERAPAMIL HCL 2.5 MG/ML IV SOLN
INTRAVENOUS | Status: DC | PRN
  Administered 2018-04-27: 10 mL via INTRA_ARTERIAL

## 2018-04-27 SURGICAL SUPPLY — 11 items
CATH INFINITI 5 FR JL3.5 (CATHETERS) ×2 IMPLANT
CATH OPTITORQUE TIG 4.0 5F (CATHETERS) ×2 IMPLANT
DEVICE RAD COMP TR BAND LRG (VASCULAR PRODUCTS) ×2 IMPLANT
GLIDESHEATH SLEND SS 6F .021 (SHEATH) ×2 IMPLANT
GUIDEWIRE INQWIRE 1.5J.035X260 (WIRE) ×1 IMPLANT
INQWIRE 1.5J .035X260CM (WIRE) ×2
KIT HEART LEFT (KITS) ×2 IMPLANT
PACK CARDIAC CATHETERIZATION (CUSTOM PROCEDURE TRAY) ×2 IMPLANT
SHEATH PROBE COVER 6X72 (BAG) ×2 IMPLANT
TRANSDUCER W/STOPCOCK (MISCELLANEOUS) ×2 IMPLANT
TUBING CIL FLEX 10 FLL-RA (TUBING) ×2 IMPLANT

## 2018-04-27 NOTE — Discharge Instructions (Signed)
HOLD METFORMIN FOR A FULL 48 HOURS AFTER DISCHARGE.   DRINK PLENTY OF FLUIDS FOR THE NEXT 2-3 DAYS TO KEEP HYDRATED.  Radial Site Care Refer to this sheet in the next few weeks. These instructions provide you with information about caring for yourself after your procedure. Your health care provider may also give you more specific instructions. Your treatment has been planned according to current medical practices, but problems sometimes occur. Call your health care provider if you have any problems or questions after your procedure. What can I expect after the procedure? After your procedure, it is typical to have the following:  Bruising at the radial site that usually fades within 1-2 weeks.  Blood collecting in the tissue (hematoma) that may be painful to the touch. It should usually decrease in size and tenderness within 1-2 weeks.  Follow these instructions at home:  Take medicines only as directed by your health care provider.  You may shower 24-48 hours after the procedure or as directed by your health care provider. Remove the bandage (dressing) and gently wash the site with plain soap and water. Pat the area dry with a clean towel. Do not rub the site, because this may cause bleeding.  Do not take baths, swim, or use a hot tub until your health care provider approves.  Check your insertion site every day for redness, swelling, or drainage.  Do not apply powder or lotion to the site.  Do not flex or bend the affected arm for 24 hours or as directed by your health care provider.  Do not push or pull heavy objects with the affected arm for 24 hours or as directed by your health care provider.  Do not lift over 10 lb (4.5 kg) for 5 days after your procedure or as directed by your health care provider.  Ask your health care provider when it is okay to: ? Return to work or school. ? Resume usual physical activities or sports. ? Resume sexual activity.  Do not drive home if  you are discharged the same day as the procedure. Have someone else drive you.  You may drive 24 hours after the procedure unless otherwise instructed by your health care provider.  Do not operate machinery or power tools for 24 hours after the procedure.  If your procedure was done as an outpatient procedure, which means that you went home the same day as your procedure, a responsible adult should be with you for the first 24 hours after you arrive home.  Keep all follow-up visits as directed by your health care provider. This is important. Contact a health care provider if:  You have a fever.  You have chills.  You have increased bleeding from the radial site. Hold pressure on the site. CALL 911 Get help right away if:  You have unusual pain at the radial site.  You have redness, warmth, or swelling at the radial site.  You have drainage (other than a small amount of blood on the dressing) from the radial site.  The radial site is bleeding, and the bleeding does not stop after 30 minutes of holding steady pressure on the site.  Your arm or hand becomes pale, cool, tingly, or numb. This information is not intended to replace advice given to you by your health care provider. Make sure you discuss any questions you have with your health care provider. Document Released: 07/04/2010 Document Revised: 11/07/2015 Document Reviewed: 12/18/2013 Elsevier Interactive Patient Education  2018 [employer].

## 2018-04-28 ENCOUNTER — Encounter (HOSPITAL_COMMUNITY): Payer: Self-pay | Admitting: Cardiology

## 2018-04-28 ENCOUNTER — Other Ambulatory Visit: Payer: Self-pay

## 2018-04-28 NOTE — Patient Outreach (Signed)
[facility]) Care Management  04/28/2018  Elaine Roach [birth year] [id number]   Telephone Screen  Referral Date: 04/28/18 Referral Source: EMMI-Prevent Referral Reason: " patient engagement score of 11, HTN, DM" Insurance: HTA   Outreach attempt # 1 to patient. Spoke with patient. [facility] services reviewed and discussed with patient. Patient voices she is independent with ADLS/IADLs. She drives herself to medical appts. Per chart review, she has PMH of CAD, HLD, Arthritis, GERD, OCD, OA and DM(A1C 6.2). She voices that she is able to manage her chronic conditions. She does not feel like she needs further education and/or assistance. Patient states that she takes her meds as ordered. She is able to manage and afford her meds without any difficulty. She denies needing any [facility] services at this time. Patient appreciative of call and to know services available to her in the future if needed. Advised patient that RN CM would send out [facility] brochure and contact info via mail for future reference.     Plan: RN CM will close case at this time. RN CM will send successful letter along with brochure and magnet to patient via mail.  Enzo Montgomery, RN,BSN,CCM [facility] Telephonic Care Management Coordinator Direct Phone: [phone] Toll Free: [phone] Fax: [phone]

## 2018-04-29 ENCOUNTER — Telehealth: Payer: Self-pay | Admitting: Cardiology

## 2018-04-29 NOTE — Telephone Encounter (Signed)
Had a cath yesterday, but nothing was done, she still feels the same

## 2018-04-29 NOTE — Telephone Encounter (Signed)
Patient called very frustrated that she is still having chest pain/pressure after her cardiac catheterization on 04/27/18. She wanted to make sure that Dr. Bettina Gavia has seen the report and see if he would like to make further medication changes at this time. Advised her that Dr. Bettina Gavia is out of the office for the rest of the afternoon and that he will review her chart on Monday morning after she denied a sooner follow up appointment next week in [facility]. Informed her that I would contact her as soon as I have answers on Monday. Patient advised to go to the emergency department if chest pain persisted and to use her nitroglycerin as needed. Patient still was not pleased with these recommendations but agreed. No further questions.

## 2018-05-02 MED ORDER — ISOSORBIDE MONONITRATE ER 30 MG PO TB24: 30.0000 mg | ORAL_TABLET | Freq: Every day | ORAL | 3 refills | Status: DC

## 2018-05-02 MED ORDER — RANOLAZINE ER 500 MG PO TB12: 500.0000 mg | ORAL_TABLET | Freq: Two times a day (BID) | ORAL | 3 refills | Status: DC

## 2018-05-02 NOTE — Telephone Encounter (Signed)
Dr. Bettina Gavia advised for patient to start taking imdur 30 mg daily and ranexa 500 mg twice daily to help with the chronic chest pain patient is experiencing. Patient agreeable, prescriptions sent to pharmacy.   Dr. Bettina Gavia recommended cardiac rehab, but patient denied.   Patient reports her blood pressure being elevated lately. She stopped taking irbesartan-hydrochlorothiazide after her office visit on 04/26/18. There was no documentation that patient was instructed to stop this medication. Dr. Bettina Gavia advised for patient to restart taking irbesartan-hydrochlorothiazide 300-12.5 mg daily. Patient verbalized understanding. No further questions.

## 2018-05-02 NOTE — Addendum Note (Signed)
Addended by: Austin Miles on: 05/02/2018 08:43 AM   Modules accepted: Orders

## 2018-05-30 NOTE — Progress Notes (Signed)
Cardiology Office Note:    Date:  05/31/2018   ID:  Elaine Roach, DOB February 25, 1951, MRN [id number]  PCP:  Algis Greenhouse, MD  Cardiologist:  Shirlee More, MD    Referring MD: Algis Greenhouse, MD    ASSESSMENT:    1. Mild CAD   2. Essential hypertension   3. Mixed hyperlipidemia    PLAN:    In order of problems listed above:  1. She is frustrated that her CAD was not severe enough for PCI and stent.  She is on combined therapy calcium channel blocker oral nitrates ranolazine and does feel improved.  We will increase the dose of oral nitrates offered her cardiac rehabilitation but she said of her cervical spine problems would prevent her from exercising.  I asked her to follow-up in the office in 3 months to assess her response to medical therapy for mild CAD she will continue her lipid-lowering therapy 2. Stable at target continue current treatment ARB 3. Stable continue her statin   Next appointment: 3 months   Medication Adjustments/Labs and Tests Ordered: Current medicines are reviewed at length with the patient today.  Concerns regarding medicines are outlined above.  No orders of the defined types were placed in this encounter.  No orders of the defined types were placed in this encounter.   Chief Complaint  Patient presents with  . Coronary Artery Disease    mild    History of Present Illness:    Elaine Roach is a 67 y.o. female with a hx of mild CAD with angina  last seen 04/26/18. Compliance with diet, lifestyle and medications: Yes  She is frustrated that she did not have a focal lesion for PCI and stent.  We intensified medical therapy calcium channel blocker ranolazine oral nitrates and she is improved but still has vague chest tightness.  She is not having limiting angina.  We will increase the dose of oral nitrates continue calcium channel blocker ranolazine and reassess in 3 months.  Conclusion    There is hyperdynamic left ventricular systolic  function.  LV end diastolic pressure is normal.  The left ventricular ejection fraction is greater than 65% by visual estimate.  There is no aortic valve stenosis.  Mid Cx lesion is 45% stenosed.  Ost Cx to Prox Cx lesion is 20% stenosed.  Prox LAD to Mid LAD lesion is 40% stenosed.  Mid RCA lesion is 65% stenosed.  Ost LM to Prox LAD lesion is 5% stenosed.   SUMMARY  Mild-Moderate diffuse CAD consistent with diabetic coronary arteries.  Most significant lesion being a 70% eccentric lesion in the relatively small caliber codominant RCA (recommend medical management, not positive by CT FFR)  Suspect abnormal coronary CTA FFR result was related to diffuse mild disease as opposed to any specific significant lesion.  Normal LVEDP with hyperdynamic left ventricle.  RECOMMENDATION  Optimize medical management -consider adding long-acting nitrate versus Ranexa.  Aggressive cardiovascular risk modification.  Recommend Aspirin 67m daily for moderate CAD.    Coronary Diagrams   Diagnostic  Dominance: Co-dominant       Past Medical History:  Diagnosis Date  . Arthritis   . Complication of anesthesia    pt. woke up during hand surgery and colonoscopy, cataract surgery  . [place]    type 2    dx 10 yrs ago  . Dysrhythmia    BENIGN PVC'S  . GERD (gastroesophageal reflux disease)   . Headache   .  History of OCD (obsessive compulsive disorder)    TO A DEGREE  . Hypertension   . Insomnia   . Nonalcoholic steatohepatitis ([facility])    DX IN 1995  . Osteoarthritis   . Pneumonia    walking pneu. in the past  . PONV (postoperative nausea and vomiting)     Past Surgical History:  Procedure Laterality Date  . ANTERIOR CERVICAL DECOMP/DISCECTOMY FUSION  07/19/2017   Procedure: ANTERIOR CERVICAL DECOMPRESSION/DISCECTOMY FUSION , INTERBODY PROSTHESIS, ANTERIOR PLATE CERVICAL SIX- CERVICAL SEVEN, CERVICAL SEVEN- THORACIC ONE, EXPLORE OLD  FUSION;  Surgeon: Newman Pies, MD;  Location: [place];  Service: Neurosurgery;;  . BACK SURGERY     x3  . BUNIONECTOMY     RIGHT  . CARDIAC CATHETERIZATION    . CARPAL TUNNEL RELEASE     RIGHT  . CHOLECYSTECTOMY    . GANGLION CYST EXCISION Right   . LEFT HEART CATH AND CORONARY ANGIOGRAPHY N/A 04/27/2018   Procedure: LEFT HEART CATH AND CORONARY ANGIOGRAPHY;  Surgeon: Leonie Man, MD;  Location: [place] [facility];  Service: Cardiovascular;  Laterality: N/A;  . MYELOGRAM    . SHOULDER ARTHROSCOPY W/ ROTATOR CUFF REPAIR     4 SCOPES.Marland KitchenMarland KitchenALL ON THE RIGHT  . TUBAL LIGATION    . WOUND EXPLORATION  03/19/2015   CERVICAL WOUND   . WOUND EXPLORATION N/A 03/19/2015   Procedure: CERVICAL WOUND EXPLORATION, EVACUATION OF CERVICAL HEMATOMA;  Surgeon: Newman Pies, MD;  Location: [place] [facility];  Service: Neurosurgery;  Laterality: N/A;  cervical wound exploration, evacuation of cervical hematoma    Current Medications: Current Meds  Medication Sig  . aspirin EC 81 MG tablet Take 81 mg by mouth daily.   Marland Kitchen atorvastatin (LIPITOR) 80 MG tablet Take 80 mg by mouth daily.  . cetirizine (ZYRTEC) 10 MG tablet Take 10 mg by mouth daily.  . Cholecalciferol (VITAMIN D3) 5000 units CAPS Take 5,000 Units by mouth daily.  . Coenzyme Q10 (COQ-10) 200 MG CAPS Take 200 mg by mouth at bedtime.  . Cyanocobalamin (B-12) 2500 MCG TABS Take 2,500 mcg by mouth daily.  Marland Kitchen diltiazem (CARDIZEM CD) 240 MG 24 hr capsule Take 1 capsule (240 mg total) by mouth daily.  Marland Kitchen glucose blood (PRECISION QID TEST) test strip 1 strip by Misc.(Non-Drug; Combo Route) route daily.  Marland Kitchen ibuprofen (ADVIL,MOTRIN) 200 MG tablet Take 800 mg by mouth daily as needed for moderate pain.  Marland Kitchen irbesartan-hydrochlorothiazide (AVALIDE) 300-12.5 MG tablet Take 1 tablet by mouth daily.   . isosorbide mononitrate (IMDUR) 30 MG 24 hr tablet Take 1 tablet (30 mg total) by mouth daily.  . metFORMIN (GLUCOPHAGE) 500 MG tablet Take 500 mg by mouth 2  (two) times daily with a meal.  . Multiple Vitamin (MULTIVITAMIN WITH MINERALS) TABS tablet Take 1 tablet by mouth daily.  . nitroGLYCERIN (NITROSTAT) 0.4 MG SL tablet Place 1 tablet (0.4 mg total) under the tongue every 5 (five) minutes as needed for chest pain.  Marland Kitchen omeprazole (PRILOSEC) 40 MG capsule Take 40 mg by mouth every morning.   . ranolazine (RANEXA) 500 MG 12 hr tablet Take 1 tablet (500 mg total) by mouth 2 (two) times daily.  . sertraline (ZOLOFT) 100 MG tablet Take 100 mg by mouth at bedtime.  Marland Kitchen zolpidem (AMBIEN) 10 MG tablet Take 10 mg by mouth daily.      Allergies:   Codeine; Lisinopril; and Tape   Social History   Socioeconomic History  . Marital status: Divorced  Spouse name: Not on file  . Number of children: Not on file  . Years of education: Not on file  . Highest education level: Not on file  Occupational History  . Not on file  Social Needs  . Financial resource strain: Not on file  . Food insecurity:    Worry: Not on file    Inability: Not on file  . Transportation needs:    Medical: Not on file    Non-medical: Not on file  Tobacco Use  . Smoking status: Former Smoker    Packs/day: 2.00    Years: 17.00    Pack years: 34.00    Types: Cigarettes  . Smokeless tobacco: Never Used  . Tobacco comment: quit when she was 35  Substance and Sexual Activity  . Alcohol use: Yes    Comment: OCCASIONAL  WINE  . Drug use: No  . Sexual activity: Not on file  Lifestyle  . Physical activity:    Days per week: Not on file    Minutes per session: Not on file  . Stress: Not on file  Relationships  . Social connections:    Talks on phone: Not on file    Gets together: Not on file    Attends religious service: Not on file    Active member of club or organization: Not on file    Attends meetings of clubs or organizations: Not on file    Relationship status: Not on file  Other Topics Concern  . Not on file  Social History Narrative  . Not on file      Family History: The patient's family history includes Breast cancer in her mother; Heart Problems in her father; Heart failure in her paternal grandfather and paternal grandmother. ROS:   Please see the history of present illness.    All other systems reviewed and are negative.  EKGs/Labs/Other Studies Reviewed:    The following studies were reviewed today:   Recent Labs: 07/23/2017: ALT 181 04/26/2018: BUN 14; Creatinine, Ser 0.91; Hemoglobin 11.3; Platelets 231; Potassium 5.1; Sodium 144  Recent Lipid Panel No results found for: CHOL, TRIG, HDL, CHOLHDL, VLDL, LDLCALC, LDLDIRECT  Physical Exam:    VS:  BP 120/66 (BP Location: Right Arm, Patient Position: Sitting, Cuff Size: Large)   Pulse 94   Ht 5' 4"  (1.626 m)   Wt 177 lb 4 oz (80.4 kg)   SpO2 95%   BMI 30.42 kg/m     Wt Readings from Last 3 Encounters:  05/31/18 177 lb 4 oz (80.4 kg)  04/27/18 180 lb (81.6 kg)  04/26/18 181 lb 3.2 oz (82.2 kg)     GEN:  Well nourished, well developed in no acute distress HEENT: Normal NECK: No JVD; No carotid bruits LYMPHATICS: No lymphadenopathy CARDIAC: RRR, no murmurs, rubs, gallops RESPIRATORY:  Clear to auscultation without rales, wheezing or rhonchi  ABDOMEN: Soft, non-tender, non-distended MUSCULOSKELETAL:  No edema; No deformity  SKIN: Warm and dry NEUROLOGIC:  Alert and oriented x 3 PSYCHIATRIC:  Normal affect    Signed, Shirlee More, MD  05/31/2018 2:38 PM    [place]

## 2018-05-31 ENCOUNTER — Ambulatory Visit: Payer: PPO | Admitting: Cardiology

## 2018-05-31 ENCOUNTER — Ambulatory Visit (INDEPENDENT_AMBULATORY_CARE_PROVIDER_SITE_OTHER): Payer: PPO | Admitting: Cardiology

## 2018-05-31 ENCOUNTER — Encounter: Payer: Self-pay | Admitting: Cardiology

## 2018-05-31 VITALS — BP 120/66 | HR 94 | Ht 64.0 in | Wt 177.2 lb

## 2018-05-31 DIAGNOSIS — I2510 Atherosclerotic heart disease of native coronary artery without angina pectoris: Secondary | ICD-10-CM

## 2018-05-31 DIAGNOSIS — E782 Mixed hyperlipidemia: Secondary | ICD-10-CM | POA: Diagnosis not present

## 2018-05-31 DIAGNOSIS — I10 Essential (primary) hypertension: Secondary | ICD-10-CM | POA: Diagnosis not present

## 2018-05-31 MED ORDER — ISOSORBIDE MONONITRATE ER 60 MG PO TB24: 60.0000 mg | ORAL_TABLET | Freq: Every day | ORAL | 3 refills | Status: DC

## 2018-05-31 NOTE — Patient Instructions (Addendum)
Medication Instructions:  Your physician has recommended you make the following change in your medication:  Increase isosorbide (Imdur) to 56m daily.  If you need a refill on your cardiac medications before your next appointment, please call your pharmacy.   Lab work: None If you have labs (blood work) drawn today and your tests are completely normal, you will receive your results only by: .Marland KitchenMyChart Message (if you have MyChart) OR . A paper copy in the mail If you have any lab test that is abnormal or we need to change your treatment, we will call you to review the results.  Testing/Procedures: None  Follow-Up: [facility] and your health needs are our priority.  As part of our continuing mission to provide you with exceptional heart care, we have created designated Provider Care Teams.  These Care Teams include your primary Cardiologist (physician) and Advanced Practice Providers (APPs -  Physician Assistants and Nurse Practitioners) who all work together to provide you with the care you need, when you need it. You will need a follow up appointment in 3 months.  Please call our office 2 months in advance to schedule this appointment.

## 2018-05-31 NOTE — Addendum Note (Signed)
Addended by: Stevan Born on: 05/31/2018 02:54 PM   Modules accepted: Orders

## 2018-06-06 ENCOUNTER — Telehealth: Payer: Self-pay | Admitting: Cardiology

## 2018-06-06 MED ORDER — ISOSORBIDE MONONITRATE ER 60 MG PO TB24: 60.0000 mg | ORAL_TABLET | Freq: Every day | ORAL | 3 refills | Status: DC

## 2018-06-06 NOTE — Telephone Encounter (Signed)
Patient needs [place] to call in Isosorbide again the [place] they say they have not received

## 2018-06-06 NOTE — Telephone Encounter (Signed)
Prescription for imdur resent to [facility] in [place].

## 2018-07-08 DIAGNOSIS — E782 Mixed hyperlipidemia: Secondary | ICD-10-CM | POA: Diagnosis not present

## 2018-08-23 ENCOUNTER — Other Ambulatory Visit: Payer: Self-pay

## 2018-08-23 MED ORDER — RANOLAZINE ER 500 MG PO TB12: 500.0000 mg | ORAL_TABLET | Freq: Two times a day (BID) | ORAL | 3 refills | Status: DC

## 2018-08-23 MED ORDER — DILTIAZEM HCL ER COATED BEADS 240 MG PO CP24: 240.0000 mg | ORAL_CAPSULE | Freq: Every day | ORAL | 3 refills | Status: DC

## 2018-08-24 ENCOUNTER — Telehealth: Payer: Self-pay | Admitting: *Deleted

## 2018-08-24 NOTE — Telephone Encounter (Signed)
If she is doing well we could consider withdrawing it at that time.She is struggled so much with chest pain that I am hesitant to stop Ranexa however

## 2018-08-24 NOTE — Telephone Encounter (Signed)
Left message to return call to discuss.

## 2018-08-24 NOTE — Telephone Encounter (Signed)
Patient returned call and explained that a 30 day supply of ranexa costs $45 and a 90 day supply costs her over $100. Patient wants to know if there is any other alternative medication she can try that would be more affordable as she would have to "re-budget her money every month" if not. Will have Dr. Bettina Gavia advise.

## 2018-08-24 NOTE — Telephone Encounter (Signed)
Pt would like recommendation for different medication, Ranexa is too expensive. Please advise

## 2018-08-25 NOTE — Telephone Encounter (Signed)
Advised patient of Dr. Joya Gaskins recommendations and she is agreeable to continue taking ranexa as prescribed. Patient reports that she still has intermittent chest tightness and wanted to make Dr. Bettina Gavia aware. Patient reminded to keep follow up appointment scheduled for Friday, 09/16/2018, at 11:00 am in the [place] office. Patient verbalized understanding.

## 2018-09-05 DIAGNOSIS — F419 Anxiety disorder, unspecified: Secondary | ICD-10-CM | POA: Diagnosis not present

## 2018-09-05 DIAGNOSIS — K7581 Nonalcoholic steatohepatitis (NASH): Secondary | ICD-10-CM | POA: Diagnosis not present

## 2018-09-05 DIAGNOSIS — E669 Obesity, unspecified: Secondary | ICD-10-CM | POA: Diagnosis not present

## 2018-09-05 DIAGNOSIS — F5101 Primary insomnia: Secondary | ICD-10-CM | POA: Diagnosis not present

## 2018-09-05 DIAGNOSIS — I10 Essential (primary) hypertension: Secondary | ICD-10-CM | POA: Diagnosis not present

## 2018-09-05 DIAGNOSIS — K219 Gastro-esophageal reflux disease without esophagitis: Secondary | ICD-10-CM | POA: Diagnosis not present

## 2018-09-05 DIAGNOSIS — Z8249 Family history of ischemic heart disease and other diseases of the circulatory system: Secondary | ICD-10-CM | POA: Insufficient documentation

## 2018-09-05 DIAGNOSIS — E119 Type 2 diabetes mellitus without complications: Secondary | ICD-10-CM | POA: Diagnosis not present

## 2018-09-05 DIAGNOSIS — Z87891 Personal history of nicotine dependence: Secondary | ICD-10-CM | POA: Diagnosis not present

## 2018-09-05 DIAGNOSIS — Z6830 Body mass index (BMI) 30.0-30.9, adult: Secondary | ICD-10-CM | POA: Diagnosis not present

## 2018-09-05 DIAGNOSIS — F429 Obsessive-compulsive disorder, unspecified: Secondary | ICD-10-CM | POA: Diagnosis not present

## 2018-09-05 DIAGNOSIS — E782 Mixed hyperlipidemia: Secondary | ICD-10-CM | POA: Diagnosis not present

## 2018-09-16 ENCOUNTER — Ambulatory Visit: Payer: PPO | Admitting: Cardiology

## 2018-09-26 ENCOUNTER — Other Ambulatory Visit: Payer: Self-pay

## 2018-09-26 MED ORDER — ISOSORBIDE MONONITRATE ER 60 MG PO TB24: 60.0000 mg | ORAL_TABLET | Freq: Every day | ORAL | 1 refills | Status: DC

## 2018-11-08 ENCOUNTER — Telehealth: Payer: Self-pay | Admitting: Cardiology

## 2018-11-08 NOTE — Telephone Encounter (Signed)
Virtual Visit Pre-Appointment Phone Call  "(Name), I am calling you today to discuss your upcoming appointment. We are currently trying to limit exposure to the virus that causes COVID-19 by seeing patients at home rather than in the office."  1. "What is the BEST phone number to call the day of the visit?" - include this in appointment notes  2. Do you have or have access to (through a family member/friend) a smartphone with video capability that we can use for your visit?" a. If yes - list this number in appt notes as cell (if different from BEST phone #) and list the appointment type as a VIDEO visit in appointment notes b. If no - list the appointment type as a PHONE visit in appointment notes  3. Confirm consent - "In the setting of the current Covid19 crisis, you are scheduled for a (phone or video) visit with your provider on (date) at (time).  Just as we do with many in-office visits, in order for you to participate in this visit, we must obtain consent.  If you'd like, I can send this to your mychart (if signed up) or email for you to review.  Otherwise, I can obtain your verbal consent now.  All virtual visits are billed to your insurance company just like a normal visit would be.  By agreeing to a virtual visit, we'd like you to understand that the technology does not allow for your provider to perform an examination, and thus may limit your provider's ability to fully assess your condition. If your provider identifies any concerns that need to be evaluated in person, we will make arrangements to do so.  Finally, though the technology is pretty good, we cannot assure that it will always work on either your or our end, and in the setting of a video visit, we may have to convert it to a phone-only visit.  In either situation, we cannot ensure that we have a secure connection.  Are you willing to proceed?" STAFF: Did the patient verbally acknowledge consent to telehealth visit? Document  YES/NO here: YES  4. Advise patient to be prepared - "Two hours prior to your appointment, go ahead and check your blood pressure, pulse, oxygen saturation, and your weight (if you have the equipment to check those) and write them all down. When your visit starts, your provider will ask you for this information. If you have an Apple Watch or Kardia device, please plan to have heart rate information ready on the day of your appointment. Please have a pen and paper handy nearby the day of the visit as well."  5. Give patient instructions for MyChart download to smartphone OR Doximity/Doxy.me as below if video visit (depending on what platform provider is using)  6. Inform patient they will receive a phone call 15 minutes prior to their appointment time (may be from unknown caller ID) so they should be prepared to answer    Elaine Roach has been deemed a candidate for a follow-up tele-health visit to limit community exposure during the Covid-19 pandemic. I spoke with the patient via phone to ensure availability of phone/video source, confirm preferred email & phone number, and discuss instructions and expectations.  I reminded Elaine Roach to be prepared with any vital sign and/or heart rhythm information that could potentially be obtained via home monitoring, at the time of her visit. I reminded Elaine Roach to expect a phone call prior to  her visit.  Frederic Jericho 11/08/2018 9:03 AM   INSTRUCTIONS FOR DOWNLOADING THE MYCHART APP TO SMARTPHONE  - The patient must first make sure to have activated MyChart and know their login information - If Apple, go to [employer] and type in MyChart in the search bar and download the app. If Android, ask patient to go to [employer] and type in [place] in the search bar and download the app. The app is free but as with any other app downloads, their phone may require them to verify saved payment information or Apple/Android  password.  - The patient will need to then log into the app with their MyChart username and password, and select [facility] as their healthcare provider to link the account. When it is time for your visit, go to the MyChart app, find appointments, and click Begin Video Visit. Be sure to Select Allow for your device to access the Microphone and Camera for your visit. You will then be connected, and your provider will be with you shortly.  **If they have any issues connecting, or need assistance please contact MyChart service desk [phone])**  **If using a computer, in order to ensure the best quality for their visit they will need to use either of the following Internet Browsers: [employer], or Google Chrome**  IF USING DOXIMITY or DOXY.ME - The patient will receive a link just prior to their visit by text.     FULL LENGTH CONSENT FOR TELE-HEALTH VISIT   I hereby voluntarily request, consent and authorize [facility] and its employed or contracted physicians, physician assistants, nurse practitioners or other licensed health care professionals (the Practitioner), to provide me with telemedicine health care services (the Services") as deemed necessary by the treating Practitioner. I acknowledge and consent to receive the Services by the Practitioner via telemedicine. I understand that the telemedicine visit will involve communicating with the Practitioner through live audiovisual communication technology and the disclosure of certain medical information by electronic transmission. I acknowledge that I have been given the opportunity to request an in-person assessment or other available alternative prior to the telemedicine visit and am voluntarily participating in the telemedicine visit.  I understand that I have the right to withhold or withdraw my consent to the use of telemedicine in the course of my care at any time, without affecting my right to future care or treatment,  and that the Practitioner or I may terminate the telemedicine visit at any time. I understand that I have the right to inspect all information obtained and/or recorded in the course of the telemedicine visit and may receive copies of available information for a reasonable fee.  I understand that some of the potential risks of receiving the Services via telemedicine include:   Delay or interruption in medical evaluation due to technological equipment failure or disruption;  Information transmitted may not be sufficient (e.g. poor resolution of images) to allow for appropriate medical decision making by the Practitioner; and/or   In rare instances, security protocols could fail, causing a breach of personal health information.  Furthermore, I acknowledge that it is my responsibility to provide information about my medical history, conditions and care that is complete and accurate to the best of my ability. I acknowledge that Practitioner's advice, recommendations, and/or decision may be based on factors not within their control, such as incomplete or inaccurate data provided by me or distortions of diagnostic images or specimens that may result from electronic transmissions. I  understand that the practice of medicine is not an exact science and that Practitioner makes no warranties or guarantees regarding treatment outcomes. I acknowledge that I will receive a copy of this consent concurrently upon execution via email to the email address I last provided but may also request a printed copy by calling the office of [facility].    I understand that my insurance will be billed for this visit.   I have read or had this consent read to me.  I understand the contents of this consent, which adequately explains the benefits and risks of the Services being provided via telemedicine.   I have been provided ample opportunity to ask questions regarding this consent and the Services and have had my questions  answered to my satisfaction.  I give my informed consent for the services to be provided through the use of telemedicine in my medical care  By participating in this telemedicine visit I agree to the above.

## 2018-11-09 ENCOUNTER — Telehealth: Payer: Self-pay | Admitting: Cardiology

## 2018-11-09 NOTE — Telephone Encounter (Signed)
Patient cancelled appt due to wanting to see Dr. Bettina Gavia in tge office only.  Patient rescheduled til July.

## 2018-11-10 ENCOUNTER — Telehealth: Payer: PPO | Admitting: Cardiology

## 2018-11-14 DIAGNOSIS — H1851 Endothelial corneal dystrophy: Secondary | ICD-10-CM | POA: Diagnosis not present

## 2018-11-14 DIAGNOSIS — H524 Presbyopia: Secondary | ICD-10-CM | POA: Diagnosis not present

## 2018-11-21 ENCOUNTER — Other Ambulatory Visit: Payer: Self-pay | Admitting: *Deleted

## 2018-11-21 ENCOUNTER — Telehealth: Payer: Self-pay | Admitting: Cardiology

## 2018-11-21 MED ORDER — DILTIAZEM HCL ER COATED BEADS 240 MG PO CP24: 240.0000 mg | ORAL_CAPSULE | Freq: Every day | ORAL | 1 refills | Status: DC

## 2018-11-21 MED ORDER — RANOLAZINE ER 500 MG PO TB12: 500.0000 mg | ORAL_TABLET | Freq: Two times a day (BID) | ORAL | 1 refills | Status: DC

## 2018-11-21 NOTE — Telephone Encounter (Signed)
°*  STAT* If patient is at the pharmacy, call can be transferred to refill team.   1. Which medications need to be refilled? (please list name of each medication and dose if known) diltiazem (CARDIZEM CD) 240 MG 24 hr capsule  2. Which pharmacy/location (including street and city if local pharmacy) is medication to be sent to?  [facility] [place], [address] AT [place] (Phone) [phone] (Fax)    3. Do they need a 30 day or 90 day supply? 90 day

## 2018-11-21 NOTE — Telephone Encounter (Signed)
°*  STAT* If patient is at the pharmacy, call can be transferred to refill team.   1. Which medications need to be refilled? (please list name of each medication and dose if known)  ranolazine (RANEXA) 500 MG 12 hr tablet   2. Which pharmacy/location (including street and city if local pharmacy) is medication to be sent to? [facility] STORE [place], [address] AT [address] [place] (Phone) [phone] (Fax)     3. Do they need a 30 day or 90 day supply? 90 day

## 2018-12-23 ENCOUNTER — Telehealth: Payer: Self-pay | Admitting: Cardiology

## 2018-12-23 ENCOUNTER — Other Ambulatory Visit: Payer: Self-pay | Admitting: *Deleted

## 2018-12-23 MED ORDER — RANOLAZINE ER 500 MG PO TB12: 500.0000 mg | ORAL_TABLET | Freq: Two times a day (BID) | ORAL | 0 refills | Status: DC

## 2018-12-23 NOTE — Telephone Encounter (Signed)
Refill Completed

## 2018-12-23 NOTE — Telephone Encounter (Signed)
°*  STAT* If patient is at the pharmacy, call can be transferred to refill team.   1. Which medications need to be refilled? (please list name of each medication and dose if known) ranolazine (RANEXA) 500 MG 12 hr    2. Which pharmacy/location (including street and city if local pharmacy) is medication to be sent to?  [facility] DR[facility] STORE [place], [address] AT [address] [place] (Phone) [phone] (Fax)    3. Do they need a 30 day or 90 day supply? 90 day

## 2018-12-25 NOTE — Progress Notes (Signed)
Cardiology Office Note:    Date:  12/27/2018   ID:  Elaine Roach, DOB [birth year], MRN [id number]  PCP:  Algis Greenhouse, MD  Cardiologist:  Shirlee More, MD    Referring MD: Algis Greenhouse, MD    ASSESSMENT:    1. Palpitations   2. Coronary artery disease involving native coronary artery of native heart with angina pectoris ([place])   3. Essential hypertension   4. Mixed hyperlipidemia   5. Type 2 diabetes mellitus without complication, without long-term current use of insulin (HCC)    PLAN:    In order of problems listed above:  1. Palpitations - Reports new onset palpitations associated with shortness of breath. Worse when laying on left side. Suspicious for PVCs. No use of OTC pro-arrthymic medications.   Will plan for 2 week Zio monitor.   Check BMP and Magnesium today.  2. CAD - Stable, no anginal symptoms. Has not had to take any of her nitroglycerin. Mild to moderate by cardiac cath 04/2018. GDMT: aspirin, CCB, statin, Ranexa, Imdur. Recommend low salt, heart healthy diet. Recommend 30 minutes of moderate-intensity exercise 3-4 times per week.    She has no anginal symptoms at our visit today. She is at acceptable cardiac risk for procedure with Dr. Arnoldo Morale of neurosurgery. It is at his discretion to inform the patient when to stop and resume aspirin if procedure is needed. Functional capacity in METs >4.  3. HTN - BP well controlled today. Checks her BP regularly at home. Continue current anti-hypertensive regimen.  4. HLD - LDL at goal of <70 in March with value of 61. Continue current statin.  5. DM2 - Followed by her PCP. A1c of 6.1 in March 2020. If additional agent needed consider SGLT2.    Next appointment: 6 weeks after Zio   Medication Adjustments/Labs and Tests Ordered: Current medicines are reviewed at length with the patient today.  Concerns regarding medicines are outlined above.  Orders Placed This Encounter  Procedures  . Basic Metabolic Panel (BMET)   . Magnesium  . LONG TERM MONITOR (3-14 DAYS)   No orders of the defined types were placed in this encounter.   Chief Complaint  Patient presents with  . Follow-up    angina  . Palpitations    History of Present Illness:    Elaine Roach is a 68 y.o. female with a hx of mild CAD with angina, DM2, HTN, HLD, NASH, GERD last seen 05/31/2018. Her last cardiac catheterization was 04/27/2018 with mild-moderate diffuse CAD and hyperdynamic EF. Seen by her PCP 09/05/18 with labs collected. Normal kidney and liver function, normal electrolytes, LDL 61, A1c 6.1.   She has been very careful given COVID19 and has been social distancing. Reports ongoing neck pain which she has an upcoming appointment with surgeon Dr. Arnoldo Morale, she anticipates surgery. Will route this note to him per her request.   Reports no anginal symptoms. No chest pain, no usage of nitroglycerin, no dyspnea on exertion. She does report new onset palpitations within the last 2 months that are associated with shortness of breath. Tells me they are "forceful". She notices them more often when laying on her left side. Tells me she cannot sleep on her left side due to feeling short of breath - denies snoring. Denies dizziness, light headedness, syncope. Tells me her palpitations occur daily and has not noted any relieving factors.   Compliance with diet, lifestyle and medications: Yes Past Medical History:  Diagnosis Date  .  Arthritis   . Complication of anesthesia    pt. woke up during hand surgery and colonoscopy, cataract surgery  . Diabetes mellitus without complication (Elaine Roach)    type 2    dx 10 yrs ago  . Dysrhythmia    BENIGN PVC'S  . GERD (gastroesophageal reflux disease)   . Headache   . History of OCD (obsessive compulsive disorder)    TO A DEGREE  . Hypertension   . Insomnia   . Nonalcoholic steatohepatitis ([facility])    DX IN 1995  . Osteoarthritis   . Pneumonia    walking pneu. in the past  . PONV (postoperative  nausea and vomiting)     Past Surgical History:  Procedure Laterality Date  . ANTERIOR CERVICAL DECOMP/DISCECTOMY FUSION  07/19/2017   Procedure: ANTERIOR CERVICAL DECOMPRESSION/DISCECTOMY FUSION , INTERBODY PROSTHESIS, ANTERIOR PLATE CERVICAL SIX- CERVICAL SEVEN, CERVICAL SEVEN- THORACIC ONE, EXPLORE OLD FUSION;  Surgeon: Newman Pies, MD;  Location: [facility];  Service: Neurosurgery;;  . BACK SURGERY     x3  . BUNIONECTOMY     RIGHT  . CARDIAC CATHETERIZATION    . CARPAL TUNNEL RELEASE     RIGHT  . CHOLECYSTECTOMY    . GANGLION CYST EXCISION Right   . LEFT HEART CATH AND CORONARY ANGIOGRAPHY N/A 04/27/2018   Procedure: LEFT HEART CATH AND CORONARY ANGIOGRAPHY;  Surgeon: Leonie Man, MD;  Location: [facility];  Service: Cardiovascular;  Laterality: N/A;  . MYELOGRAM    . SHOULDER ARTHROSCOPY W/ ROTATOR CUFF REPAIR     4 SCOPES.Marland KitchenMarland KitchenALL ON THE RIGHT  . TUBAL LIGATION    . WOUND EXPLORATION  03/19/2015   CERVICAL WOUND   . WOUND EXPLORATION N/A 03/19/2015   Procedure: CERVICAL WOUND EXPLORATION, EVACUATION OF CERVICAL HEMATOMA;  Surgeon: Newman Pies, MD;  Location: [facility];  Service: Neurosurgery;  Laterality: N/A;  cervical wound exploration, evacuation of cervical hematoma    Current Medications: Current Meds  Medication Sig  . aspirin EC 81 MG tablet Take 81 mg by mouth daily.   Marland Kitchen atorvastatin (LIPITOR) 80 MG tablet Take 80 mg by mouth daily.  . cetirizine (ZYRTEC) 10 MG tablet Take 10 mg by mouth daily.  . Cholecalciferol (VITAMIN D3) 5000 units CAPS Take 5,000 Units by mouth daily.  Marland Kitchen diltiazem (CARDIZEM CD) 240 MG 24 hr capsule Take 1 capsule (240 mg total) by mouth daily.  Marland Kitchen glucose blood (PRECISION QID TEST) test strip 1 strip by Misc.(Non-Drug; Combo Route) route daily.  Marland Kitchen ibuprofen (ADVIL,MOTRIN) 200 MG tablet Take 800 mg by mouth daily as needed for moderate pain.  Marland Kitchen irbesartan-hydrochlorothiazide (AVALIDE) 300-12.5 MG tablet Take 1 tablet by mouth  daily.   . isosorbide mononitrate (IMDUR) 60 MG 24 hr tablet Take 1 tablet (60 mg total) by mouth daily.  . metFORMIN (GLUCOPHAGE) 500 MG tablet Take 500 mg by mouth 2 (two) times daily with a meal.  . Multiple Vitamin (MULTIVITAMIN WITH MINERALS) TABS tablet Take 1 tablet by mouth daily.  . nitroGLYCERIN (NITROSTAT) 0.4 MG SL tablet Place 1 tablet (0.4 mg total) under the tongue every 5 (five) minutes as needed for chest pain.  Marland Kitchen omeprazole (PRILOSEC) 40 MG capsule Take 40 mg by mouth every morning.   . ranolazine (RANEXA) 500 MG 12 hr tablet Take 1 tablet (500 mg total) by mouth 2 (two) times daily.  . sertraline (ZOLOFT) 100 MG tablet Take 100 mg by mouth at bedtime.  Marland Kitchen zolpidem (AMBIEN) 10 MG tablet Take 10  mg by mouth daily.      Allergies:   Codeine, Lisinopril, and Tape   Social History   Socioeconomic History  . Marital status: Divorced    Spouse name: Not on file  . Number of children: Not on file  . Years of education: Not on file  . Highest education level: Not on file  Occupational History  . Not on file  Social Needs  . Financial resource strain: Not on file  . Food insecurity    Worry: Not on file    Inability: Not on file  . Transportation needs    Medical: Not on file    Non-medical: Not on file  Tobacco Use  . Smoking status: Former Smoker    Packs/day: 2.00    Years: 17.00    Pack years: 34.00    Types: Cigarettes  . Smokeless tobacco: Never Used  . Tobacco comment: quit when she was 35  Substance and Sexual Activity  . Alcohol use: Yes    Comment: OCCASIONAL  WINE  . Drug use: No  . Sexual activity: Not on file  Lifestyle  . Physical activity    Days per week: Not on file    Minutes per session: Not on file  . Stress: Not on file  Relationships  . Social Herbalist on phone: Not on file    Gets together: Not on file    Attends religious service: Not on file    Active member of club or organization: Not on file    Attends meetings of  clubs or organizations: Not on file    Relationship status: Not on file  Other Topics Concern  . Not on file  Social History Narrative  . Not on file     Family History: The patient's family history includes Breast cancer in her mother; Heart Problems in her father; Heart failure in her paternal grandfather and paternal grandmother. ROS:   Please see the history of present illness.    Review of Systems  Constitution: Negative for chills, fever and malaise/fatigue.  Cardiovascular: Positive for palpitations. Negative for chest pain, dyspnea on exertion, leg swelling and near-syncope.  Respiratory: Positive for sleep disturbances due to breathing ("hard to breath laying on my left side"). Negative for cough and wheezing.   Gastrointestinal: Negative for nausea and vomiting.  Neurological: Negative for dizziness, light-headedness, loss of balance and weakness.    All other systems reviewed and are negative.  EKGs/Labs/Other Studies Reviewed:    The following studies were reviewed today: Cardiac Cath 04/2018 Conclusion     There is hyperdynamic left ventricular systolic function.  LV end diastolic pressure is normal.  The left ventricular ejection fraction is greater than 65% by visual estimate.  There is no aortic valve stenosis.  Mid Cx lesion is 45% stenosed.  Ost Cx to Prox Cx lesion is 20% stenosed.  Prox LAD to Mid LAD lesion is 40% stenosed.  Mid RCA lesion is 65% stenosed.  Ost LM to Prox LAD lesion is 5% stenosed.   SUMMARY  Mild-Moderate diffuse CAD consistent with diabetic coronary arteries.  Most significant lesion being a 70% eccentric lesion in the relatively small caliber codominant RCA (recommend medical management, not positive by CT FFR)  Suspect abnormal coronary CTA FFR result was related to diffuse mild disease as opposed to any specific significant lesion.  Normal LVEDP with hyperdynamic left ventricle.   RECOMMENDATION  Optimize medical  management -consider adding long-acting nitrate versus Ranexa.  Aggressive cardiovascular risk modification.  Recommend Aspirin 51m daily for moderate CAD.     Coronary Diagrams    Diagnostic  Dominance: Co-dominant      EKG:  No EKG today  Recent Labs: 09/05/18 via [facility]  A1c 6.1,  K 4.8, Creatinine 0.98, ALT 18, AST 16, GFR 60 Recent Lipid Panel 09/05/18 via [facility]  Total 146 HDL 62 LDL 61 Triglycerides 201  Physical Exam:    VS:  BP 126/72 (BP Location: Right Arm, Patient Position: Sitting, Cuff Size: Large)   Pulse 75   Temp 98.7 F (37.1 C)   Ht 5' 4"  (1.626 m)   Wt 180 lb (81.6 kg)   SpO2 97%   BMI 30.90 kg/m     Wt Readings from Last 3 Encounters:  12/27/18 180 lb (81.6 kg)  05/31/18 177 lb 4 oz (80.4 kg)  04/27/18 180 lb (81.6 kg)     GEN:  Well nourished, well developed in no acute distress HEENT: Normal NECK: No JVD; No carotid bruits LYMPHATICS: No lymphadenopathy CARDIAC: RRR, no murmurs, rubs, gallops RESPIRATORY:  Clear to auscultation without rales, wheezing or rhonchi  ABDOMEN: Soft, non-tender, non-distended MUSCULOSKELETAL:  No edema; No deformity  SKIN: Warm and dry NEUROLOGIC:  Alert and oriented x 3 PSYCHIATRIC:  Normal affect    Signed, BShirlee More MD  12/27/2018 11:23 AM    [place]

## 2018-12-27 ENCOUNTER — Encounter: Payer: Self-pay | Admitting: Cardiology

## 2018-12-27 ENCOUNTER — Other Ambulatory Visit: Payer: Self-pay

## 2018-12-27 ENCOUNTER — Ambulatory Visit (INDEPENDENT_AMBULATORY_CARE_PROVIDER_SITE_OTHER): Payer: PPO | Admitting: Cardiology

## 2018-12-27 VITALS — BP 126/72 | HR 75 | Temp 98.7°F | Ht 64.0 in | Wt 180.0 lb

## 2018-12-27 DIAGNOSIS — R002 Palpitations: Secondary | ICD-10-CM

## 2018-12-27 DIAGNOSIS — I25119 Atherosclerotic heart disease of native coronary artery with unspecified angina pectoris: Secondary | ICD-10-CM | POA: Diagnosis not present

## 2018-12-27 DIAGNOSIS — E782 Mixed hyperlipidemia: Secondary | ICD-10-CM

## 2018-12-27 DIAGNOSIS — I10 Essential (primary) hypertension: Secondary | ICD-10-CM

## 2018-12-27 DIAGNOSIS — E119 Type 2 diabetes mellitus without complications: Secondary | ICD-10-CM

## 2018-12-27 NOTE — Patient Instructions (Signed)
Medication Instructions:  Your physician recommends that you continue on your current medications as directed. Please refer to the Current Medication list given to you today.  If you need a refill on your cardiac medications before your next appointment, please call your pharmacy.   Lab work: Your physician recommends that you return for lab work in:  TODAY BMP, Magnesium  If you have labs (blood work) drawn today and your tests are completely normal, you will receive your results only by: Marland Kitchen MyChart Message (if you have MyChart) OR . A paper copy in the mail If you have any lab test that is abnormal or we need to change your treatment, we will call you to review the results.  Testing/Procedures: Your physician has recommended that you wear aZIO monitor. ZIO monitors are medical devices that record the heart's electrical activity. Doctors most often use these monitors to diagnose arrhythmias. Arrhythmias are problems with the speed or rhythm of the heartbeat. The monitor is a small, portable device. You can wear one while you do your normal daily activities. This is usually used to diagnose what is causing palpitations/syncope (passing out).  Wear 14 days  Follow-Up: At [facility], you and your health needs are our priority.  As part of our continuing mission to provide you with exceptional heart care, we have created designated Provider Care Teams.  These Care Teams include your primary Cardiologist (physician) and Advanced Practice Providers (APPs -  Physician Assistants and Nurse Practitioners) who all work together to provide you with the care you need, when you need it. You will need a follow up appointment in 6 weeks.   Any Other Special Instructions Will Be Listed Below (If Applicable).

## 2018-12-28 LAB — BASIC METABOLIC PANEL
BUN/Creatinine Ratio: 19 (ref 12–28)
BUN: 17 mg/dL (ref 8–27)
CO2: 26 mmol/L (ref 20–29)
Calcium: 9.3 mg/dL (ref 8.7–10.3)
Chloride: 106 mmol/L (ref 96–106)
Creatinine, Ser: 0.91 mg/dL (ref 0.57–1.00)
GFR calc Af Amer: 76 mL/min/{1.73_m2} (ref >59)
GFR calc non Af Amer: 65 mL/min/{1.73_m2} (ref >59)
Glucose: 111 mg/dL — ABNORMAL HIGH (ref 65–99)
Potassium: 4.7 mmol/L (ref 3.5–5.2)
Sodium: 145 mmol/L — ABNORMAL HIGH (ref 134–144)

## 2018-12-28 LAB — MAGNESIUM: Magnesium: 1.7 mg/dL (ref 1.6–2.3)

## 2019-01-02 ENCOUNTER — Telehealth: Payer: Self-pay | Admitting: Cardiology

## 2019-01-02 DIAGNOSIS — M542 Cervicalgia: Secondary | ICD-10-CM | POA: Diagnosis not present

## 2019-01-02 DIAGNOSIS — Z6831 Body mass index (BMI) 31.0-31.9, adult: Secondary | ICD-10-CM | POA: Diagnosis not present

## 2019-01-02 DIAGNOSIS — I10 Essential (primary) hypertension: Secondary | ICD-10-CM | POA: Diagnosis not present

## 2019-01-02 NOTE — Telephone Encounter (Signed)
Patient called and states that she was to receive a ZIO and she has not gotten yet.

## 2019-01-03 ENCOUNTER — Other Ambulatory Visit (INDEPENDENT_AMBULATORY_CARE_PROVIDER_SITE_OTHER): Payer: PPO

## 2019-01-03 DIAGNOSIS — R002 Palpitations: Secondary | ICD-10-CM | POA: Diagnosis not present

## 2019-01-03 NOTE — Telephone Encounter (Signed)
Spoke with patient and informed her that according to the ZIO website the monitor was delivered today at 135pm.  Patient has not checked her mail today.  If she finds that the monitor was not delivered she will contact our office.  Otherwise, she will proceed with monitor as previously instructed.  Patient agreed to plan and verbalized understanding.  No further questions.

## 2019-01-13 ENCOUNTER — Telehealth: Payer: Self-pay | Admitting: Cardiology

## 2019-01-13 NOTE — Telephone Encounter (Signed)
Patient reports that the ZIO monitor fell off in the middle of the night last night while she was sleeping so she put it in the mail today to be returned to the [facility] for processing. Patient reports having no energy, palpitations, shortness of breath with breathing, and intermittent chest tightness. She denies any active chest pain or severe shortness of breath at this time. Patient reminded of the nitroglycerin protocol and she confirmed that she has nitroglycerin to use at home if needed. Advised her if symptoms worsen over the weekend to go to the nearest Emergency Department to be evaluated. Patient verbalized understanding.   Will make Dr. Bettina Gavia aware and have him advise of any further recommendations pending the monitor report.

## 2019-01-13 NOTE — Telephone Encounter (Signed)
Patient was on zio patch and returned but each week gets more uncomfortable, more chest tightness, very heavy pulse. Dizzy upon standing too quickly. Has to concentrate on breathing, very fatigued.

## 2019-01-17 ENCOUNTER — Telehealth: Payer: Self-pay | Admitting: Nurse Practitioner

## 2019-01-17 ENCOUNTER — Other Ambulatory Visit: Payer: Self-pay | Admitting: Neurosurgery

## 2019-01-17 DIAGNOSIS — M47812 Spondylosis without myelopathy or radiculopathy, cervical region: Secondary | ICD-10-CM

## 2019-01-17 NOTE — Telephone Encounter (Signed)
Phone call to patient to verify medication list and allergies for myelogram procedure. Pt instructed to hold Zoloft for 48hrs prior to myelogram appointment time. Pt verbalized understanding. Pre and post procedure instructions reviewed with pt.

## 2019-01-21 DIAGNOSIS — R002 Palpitations: Secondary | ICD-10-CM | POA: Diagnosis not present

## 2019-01-23 DIAGNOSIS — Z124 Encounter for screening for malignant neoplasm of cervix: Secondary | ICD-10-CM | POA: Diagnosis not present

## 2019-01-23 DIAGNOSIS — Z01419 Encounter for gynecological examination (general) (routine) without abnormal findings: Secondary | ICD-10-CM | POA: Diagnosis not present

## 2019-01-23 DIAGNOSIS — Z1231 Encounter for screening mammogram for malignant neoplasm of breast: Secondary | ICD-10-CM | POA: Diagnosis not present

## 2019-01-26 ENCOUNTER — Ambulatory Visit
Admission: RE | Admit: 2019-01-26 | Discharge: 2019-01-26 | Disposition: A | Discharge status: Home / Self Care | Payer: PPO | Source: Ambulatory Visit | Attending: Neurosurgery | Admitting: Neurosurgery

## 2019-01-26 ENCOUNTER — Other Ambulatory Visit: Payer: Self-pay

## 2019-01-26 DIAGNOSIS — M47812 Spondylosis without myelopathy or radiculopathy, cervical region: Secondary | ICD-10-CM

## 2019-01-26 DIAGNOSIS — M1288 Other specific arthropathies, not elsewhere classified, other specified site: Secondary | ICD-10-CM | POA: Diagnosis not present

## 2019-01-26 DIAGNOSIS — R51 Headache: Secondary | ICD-10-CM | POA: Diagnosis not present

## 2019-01-26 MED ORDER — DIAZEPAM 5 MG PO TABS
5.0000 mg | ORAL_TABLET | Freq: Once | ORAL | Status: AC
  Administered 2019-01-26: 5 mg via ORAL

## 2019-01-26 MED ORDER — MEPERIDINE HCL 100 MG/ML IJ SOLN
75.0000 mg | Freq: Once | INTRAMUSCULAR | Status: AC
  Administered 2019-01-26: 13:00:00 75 mg via INTRAMUSCULAR

## 2019-01-26 MED ORDER — IOPAMIDOL (ISOVUE-M 300) INJECTION 61%
10.0000 mL | Freq: Once | INTRAMUSCULAR | Status: AC | PRN
  Administered 2019-01-26: 10 mL via INTRATHECAL

## 2019-01-26 MED ORDER — ONDANSETRON HCL 4 MG/2ML IJ SOLN
4.0000 mg | Freq: Once | INTRAMUSCULAR | Status: AC
  Administered 2019-01-26: 4 mg via INTRAMUSCULAR

## 2019-01-26 NOTE — Discharge Instructions (Signed)

## 2019-02-01 ENCOUNTER — Other Ambulatory Visit: Payer: Self-pay | Admitting: Family Medicine

## 2019-02-01 DIAGNOSIS — Z1231 Encounter for screening mammogram for malignant neoplasm of breast: Secondary | ICD-10-CM

## 2019-02-09 ENCOUNTER — Ambulatory Visit: Payer: PPO | Admitting: Cardiology

## 2019-02-17 DIAGNOSIS — M47812 Spondylosis without myelopathy or radiculopathy, cervical region: Secondary | ICD-10-CM | POA: Diagnosis not present

## 2019-02-17 DIAGNOSIS — M542 Cervicalgia: Secondary | ICD-10-CM | POA: Diagnosis not present

## 2019-02-17 DIAGNOSIS — Z6831 Body mass index (BMI) 31.0-31.9, adult: Secondary | ICD-10-CM | POA: Diagnosis not present

## 2019-03-17 ENCOUNTER — Other Ambulatory Visit: Payer: Self-pay | Admitting: Cardiology

## 2019-03-21 ENCOUNTER — Other Ambulatory Visit: Payer: Self-pay

## 2019-03-21 ENCOUNTER — Ambulatory Visit
Admission: RE | Admit: 2019-03-21 | Discharge: 2019-03-21 | Disposition: A | Discharge status: Home / Self Care | Payer: PPO | Source: Ambulatory Visit | Attending: Family Medicine | Admitting: Family Medicine

## 2019-03-21 DIAGNOSIS — Z1231 Encounter for screening mammogram for malignant neoplasm of breast: Secondary | ICD-10-CM | POA: Diagnosis not present

## 2019-04-04 DIAGNOSIS — M47812 Spondylosis without myelopathy or radiculopathy, cervical region: Secondary | ICD-10-CM | POA: Diagnosis not present

## 2019-04-09 NOTE — Progress Notes (Deleted)
Cardiology Office Note:    Date:  04/09/2019   ID:  Elaine Roach, DOB 1950/07/07, MRN [id number]  PCP:  Algis Greenhouse, MD  Cardiologist:  Shirlee More, MD    Referring MD: Algis Greenhouse, MD    ASSESSMENT:    No diagnosis found. PLAN:    In order of problems listed above:  1. ***   Next appointment: ***   Medication Adjustments/Labs and Tests Ordered: Current medicines are reviewed at length with the patient today.  Concerns regarding medicines are outlined above.  No orders of the defined types were placed in this encounter.  No orders of the defined types were placed in this encounter.   No chief complaint on file.   History of Present Illness:    Elaine Roach is a 68 y.o. female with a hx of mild CAD with angina, DM2, HTN, HLD, NASH, GERD last seen 05/31/2018. Her last cardiac catheterization was 04/27/2018 with mild-moderate diffuse CAD and hyperdynamic EF. last seen 12/27/2018 with worsened palpitation. Compliance with diet, lifestyle and medications: ***  Study Highlights  A ZIO monitor was performed for 9 days and 18 hours to evaluate palpitation.  The rhythm throughout is sinus with minimum average and maximum heart rates of 48, 80 and 146 bpm.  There were no pauses of 3 seconds or greater and no episodes of sinus node or AV nodal block.  Brief junctional rhythm was seen once at 1 AM at 40 bpm.  Ventricular arrhythmias rare with isolated PVCs.  Supraventricular arrhythmia was rare with APCs and 12 brief runs of SVT the longest 15 complexes at a rate of 125 bpm.  There were no episodes of atrial fibrillation or flutter.  There were 2 diary events 1 associated with supraventricular ectopy.  There were 24 triggered events at times associated with PVCs but predominantly during sinus rhythm.   Conclusion, rare ventricular and supraventricular arrhythmia.  1 of the diary events and at times triggered events are associated with PVCs.     Past Medical History:  Diagnosis Date  . Arthritis   . Complication of anesthesia    pt. woke up during hand surgery and colonoscopy, cataract surgery  . Diabetes mellitus without complication ([place])    type 2    dx 10 yrs ago  . Dysrhythmia    BENIGN PVC'S  . GERD (gastroesophageal reflux disease)   . Headache   . History of OCD (obsessive compulsive disorder)    TO A DEGREE  . Hypertension   . Insomnia   . Nonalcoholic steatohepatitis ([facility])    DX IN 1995  . Osteoarthritis   . Pneumonia    walking pneu. in the past  . PONV (postoperative nausea and vomiting)     Past Surgical History:  Procedure Laterality Date  . ANTERIOR CERVICAL DECOMP/DISCECTOMY FUSION  07/19/2017   Procedure: ANTERIOR CERVICAL DECOMPRESSION/DISCECTOMY FUSION , INTERBODY PROSTHESIS, ANTERIOR PLATE CERVICAL SIX- CERVICAL SEVEN, CERVICAL SEVEN- THORACIC ONE, EXPLORE OLD FUSION;  Surgeon: Newman Pies, MD;  Location: [facility];  Service: Neurosurgery;;  . BACK SURGERY     x3  . BUNIONECTOMY     RIGHT  . CARDIAC CATHETERIZATION    . CARPAL TUNNEL RELEASE     RIGHT  . CHOLECYSTECTOMY    . GANGLION CYST EXCISION Right   . LEFT HEART CATH AND CORONARY ANGIOGRAPHY N/A 04/27/2018   Procedure: LEFT HEART CATH AND CORONARY ANGIOGRAPHY;  Surgeon: Leonie Man, MD;  Location: [facility];  Service: Cardiovascular;  Laterality: N/A;  . MYELOGRAM    . SHOULDER ARTHROSCOPY W/ ROTATOR CUFF REPAIR     4 SCOPES.Marland KitchenMarland KitchenALL ON THE RIGHT  . TUBAL LIGATION    . WOUND EXPLORATION  03/19/2015   CERVICAL WOUND   . WOUND EXPLORATION N/A 03/19/2015   Procedure: CERVICAL WOUND EXPLORATION, EVACUATION OF CERVICAL HEMATOMA;  Surgeon: Newman Pies, MD;  Location: [facility];  Service: Neurosurgery;  Laterality: N/A;  cervical wound exploration, evacuation of cervical hematoma    Current Medications: No outpatient medications have been marked as taking for the 04/11/19 encounter (Appointment) with Richardo Priest, MD.      Allergies:   Codeine, Lisinopril, and Tape   Social History   Socioeconomic History  . Marital status: Divorced    Spouse name: Not on file  . Number of children: Not on file  . Years of education: Not on file  . Highest education level: Not on file  Occupational History  . Not on file  Social Needs  . Financial resource strain: Not on file  . Food insecurity    Worry: Not on file    Inability: Not on file  . Transportation needs    Medical: Not on file    Non-medical: Not on file  Tobacco Use  . Smoking status: Former Smoker    Packs/day: 2.00    Years: 17.00    Pack years: 34.00    Types: Cigarettes  . Smokeless tobacco: Never Used  . Tobacco comment: quit when she was 35  Substance and Sexual Activity  . Alcohol use: Yes    Comment: OCCASIONAL  WINE  . Drug use: No  . Sexual activity: Not on file  Lifestyle  . Physical activity    Days per week: Not on file    Minutes per session: Not on file  . Stress: Not on file  Relationships  . Social Herbalist on phone: Not on file    Gets together: Not on file    Attends religious service: Not on file    Active member of club or organization: Not on file    Attends meetings of clubs or organizations: Not on file    Relationship status: Not on file  Other Topics Concern  . Not on file  Social History Narrative  . Not on file     Family History: The patient's ***family history includes Breast cancer in her mother; Heart Problems in her father; Heart failure in her paternal grandfather and paternal grandmother. ROS:   Please see the history of present illness.    All other systems reviewed and are negative.  EKGs/Labs/Other Studies Reviewed:    The following studies were reviewed today:  EKG:  EKG ordered today and personally reviewed.  The ekg ordered today demonstrates ***  Recent Labs: 04/26/2018: Hemoglobin 11.3; Platelets 231 12/27/2018: BUN 17; Creatinine, Ser 0.91; Magnesium 1.7;  Potassium 4.7; Sodium 145  Recent Lipid Panel No results found for: CHOL, TRIG, HDL, CHOLHDL, VLDL, LDLCALC, LDLDIRECT  Physical Exam:    VS:  There were no vitals taken for this visit.    Wt Readings from Last 3 Encounters:  12/27/18 180 lb (81.6 kg)  05/31/18 177 lb 4 oz (80.4 kg)  04/27/18 180 lb (81.6 kg)     GEN: *** Well nourished, well developed in no acute distress HEENT: Normal NECK: No JVD; No carotid bruits LYMPHATICS: No lymphadenopathy CARDIAC: ***RRR, no murmurs, rubs, gallops RESPIRATORY:  Clear to  auscultation without rales, wheezing or rhonchi  ABDOMEN: Soft, non-tender, non-distended MUSCULOSKELETAL:  No edema; No deformity  SKIN: Warm and dry NEUROLOGIC:  Alert and oriented x 3 PSYCHIATRIC:  Normal affect    Signed, Shirlee More, MD  04/09/2019 2:31 PM    [facility]

## 2019-04-11 ENCOUNTER — Ambulatory Visit: Payer: PPO | Admitting: Cardiology

## 2019-04-25 DIAGNOSIS — M47812 Spondylosis without myelopathy or radiculopathy, cervical region: Secondary | ICD-10-CM | POA: Diagnosis not present

## 2019-05-19 ENCOUNTER — Ambulatory Visit: Payer: PPO | Admitting: Cardiology

## 2019-06-01 ENCOUNTER — Telehealth: Payer: Self-pay | Admitting: *Deleted

## 2019-06-01 MED ORDER — DILTIAZEM HCL ER COATED BEADS 240 MG PO CP24: 240.0000 mg | ORAL_CAPSULE | Freq: Every day | ORAL | 0 refills | Status: DC

## 2019-06-01 NOTE — Telephone Encounter (Signed)
Rx refill sent to pharmacy. 

## 2019-06-02 ENCOUNTER — Other Ambulatory Visit: Payer: Self-pay | Admitting: *Deleted

## 2019-06-02 MED ORDER — RANOLAZINE ER 500 MG PO TB12: 500.0000 mg | ORAL_TABLET | Freq: Two times a day (BID) | ORAL | 0 refills | Status: DC

## 2019-06-05 ENCOUNTER — Other Ambulatory Visit: Payer: Self-pay | Admitting: *Deleted

## 2019-06-05 MED ORDER — DILTIAZEM HCL ER COATED BEADS 240 MG PO CP24: 240.0000 mg | ORAL_CAPSULE | Freq: Every day | ORAL | 0 refills | Status: DC

## 2019-06-05 NOTE — Telephone Encounter (Signed)
This is supposed to be 180 and not 60 please this helps her with her copay.Marland Kitchen

## 2019-06-05 NOTE — Telephone Encounter (Signed)
New refill sent

## 2019-06-19 NOTE — Progress Notes (Signed)
Cardiology Office Note:    Date:  06/20/2019   ID:  Elaine Roach, DOB [birth year], MRN [id number]  PCP:  Algis Greenhouse, MD  Cardiologist:  Shirlee More, MD    Referring MD: Algis Greenhouse, MD    ASSESSMENT:    1. Mild CAD   2. Palpitations   3. Essential hypertension   4. Mixed hyperlipidemia   5. Type 2 diabetes mellitus without complication, without long-term current use of insulin (HCC)    PLAN:    In order of problems listed above:  1. Stable CAD she is having no angina I asked her to resume aspirin 81 mg daily continue lipid-lowering treatment for high intensity statin she is due for labs in the next week with her PCP and continue ranolazine and isosorbide which have alleviated angina. 2. Stable at this time would not advise additional heart rhythm medication the rate limiting calcium channel blocker BP at target if she is having significant orthostatic symptoms and documented systolics less than 465 I would decrease her antihypertensive medication Stable continue high intensity statin she is due for labs in the next week with her PCP Managed by her primary care physician  Next appointment: 6 months   Medication Adjustments/Labs and Tests Ordered: Current medicines are reviewed at length with the patient today.  Concerns regarding medicines are outlined above.  No orders of the defined types were placed in this encounter.   No orders of the defined types were placed in this encounter.   Chief Complaint  Patient presents with  . Palpitations    History of Present Illness:    Elaine Roach is a 69 y.o. female with a hx of mild CAD with angina, DM2, HTN, HLD, NASH, GERD last seen 05/31/2018. Her last cardiac catheterization was 04/27/2018 with mild-moderate diffuse CAD and hyperdynamic EF. Seen by her PCP 09/05/18 with labs collected. Normal kidney and liver function, normal electrolytes, LDL 61, A1c 6.1.    She was last seen 12/27/2018.  Compliance with  diet, lifestyle and medications: Yes  I reviewed her cardiac studies with her including her monitor not having prominent palpitations at this time I would not give her additional cardiac medication.  Fortunately she is improved with angina is not having it with medical treatment and complains of orthostatic symptoms but has no significant drop in my office.  I have asked her to check home standing blood pressure daily she is getting numbers less than 105 to contact me and I decrease her antihypertensive.  No angina shortness of breath or syncope.  Zio 01/31/2019: Study Highlights A ZIO monitor was performed for 9 days and 18 hours to evaluate palpitation. The rhythm throughout is sinus with minimum average and maximum heart rates of 48, 80 and 146 bpm. There were no pauses of 3 seconds or greater and no episodes of sinus node or AV nodal block.  Brief junctional rhythm was seen once at 1 AM at 40 bpm. Ventricular arrhythmias rare with isolated PVCs. Supraventricular arrhythmia was rare with APCs and 12 brief runs of SVT the longest 15 complexes at a rate of 125 bpm.  There were no episodes of atrial fibrillation or flutter. There were 2 diary events 1 associated with supraventricular ectopy. There were 24 triggered events at times associated with PVCs but predominantly during sinus rhythm.  Conclusion, rare ventricular and supraventricular arrhythmia.  1 of the diary events and at times triggered events are associated with PVCs.    Past Medical History:  Diagnosis Date  . Arthritis   . Complication of anesthesia    pt. woke up during hand surgery and colonoscopy, cataract surgery  . Diabetes mellitus without complication ([place])    type 2    dx 10 yrs ago  . Dysrhythmia    BENIGN PVC'S  . GERD (gastroesophageal reflux disease)   . Headache   . History of OCD (obsessive compulsive disorder)    TO A DEGREE  . Hypertension   . Insomnia   . Nonalcoholic steatohepatitis ([facility])    DX IN  1995  . Osteoarthritis   . Pneumonia    walking pneu. in the past  . PONV (postoperative nausea and vomiting)     Past Surgical History:  Procedure Laterality Date  . ANTERIOR CERVICAL DECOMP/DISCECTOMY FUSION  07/19/2017   Procedure: ANTERIOR CERVICAL DECOMPRESSION/DISCECTOMY FUSION , INTERBODY PROSTHESIS, ANTERIOR PLATE CERVICAL SIX- CERVICAL SEVEN, CERVICAL SEVEN- THORACIC ONE, EXPLORE OLD FUSION;  Surgeon: Newman Pies, MD;  Location: [facility];  Service: Neurosurgery;;  . BACK SURGERY     x3  . BUNIONECTOMY     RIGHT  . CARDIAC CATHETERIZATION    . CARPAL TUNNEL RELEASE     RIGHT  . CHOLECYSTECTOMY    . GANGLION CYST EXCISION Right   . LEFT HEART CATH AND CORONARY ANGIOGRAPHY N/A 04/27/2018   Procedure: LEFT HEART CATH AND CORONARY ANGIOGRAPHY;  Surgeon: Leonie Man, MD;  Location: [facility];  Service: Cardiovascular;  Laterality: N/A;  . MYELOGRAM    . SHOULDER ARTHROSCOPY W/ ROTATOR CUFF REPAIR     4 SCOPES.Marland KitchenMarland KitchenALL ON THE RIGHT  . TUBAL LIGATION    . WOUND EXPLORATION  03/19/2015   CERVICAL WOUND   . WOUND EXPLORATION N/A 03/19/2015   Procedure: CERVICAL WOUND EXPLORATION, EVACUATION OF CERVICAL HEMATOMA;  Surgeon: Newman Pies, MD;  Location: [place] [facility];  Service: Neurosurgery;  Laterality: N/A;  cervical wound exploration, evacuation of cervical hematoma    Current Medications: Current Meds  Medication Sig  . atorvastatin (LIPITOR) 80 MG tablet Take 80 mg by mouth daily.  . Cholecalciferol (VITAMIN D3) 5000 units CAPS Take 5,000 Units by mouth daily.  Marland Kitchen diltiazem (CARDIZEM CD) 240 MG 24 hr capsule Take 1 capsule (240 mg total) by mouth daily.  Marland Kitchen glucose blood (PRECISION QID TEST) test strip 1 strip by Misc.(Non-Drug; Combo Route) route daily.  Marland Kitchen ibuprofen (ADVIL,MOTRIN) 200 MG tablet Take 800 mg by mouth daily as needed for moderate pain.  Marland Kitchen irbesartan-hydrochlorothiazide (AVALIDE) 300-12.5 MG tablet Take 1 tablet by mouth daily.   . isosorbide  mononitrate (IMDUR) 60 MG 24 hr tablet TAKE 1 TABLET(60 MG) BY MOUTH DAILY  . metFORMIN (GLUCOPHAGE) 500 MG tablet Take 500 mg by mouth 2 (two) times daily with a meal.  . nitroGLYCERIN (NITROSTAT) 0.4 MG SL tablet Place 1 tablet (0.4 mg total) under the tongue every 5 (five) minutes as needed for chest pain.  Marland Kitchen omeprazole (PRILOSEC) 40 MG capsule Take 40 mg by mouth every morning.   . ranolazine (RANEXA) 500 MG 12 hr tablet Take 1 tablet (500 mg total) by mouth 2 (two) times daily.  . sertraline (ZOLOFT) 100 MG tablet Take 100 mg by mouth at bedtime.  Marland Kitchen zolpidem (AMBIEN) 10 MG tablet Take 10 mg by mouth daily.   . [DISCONTINUED] Multiple Vitamin (MULTIVITAMIN WITH MINERALS) TABS tablet Take 1 tablet by mouth daily.     Allergies:   Codeine, Lisinopril, and Tape   Social History   Socioeconomic History  .  Marital status: Divorced    Spouse name: Not on file  . Number of children: Not on file  . Years of education: Not on file  . Highest education level: Not on file  Occupational History  . Not on file  Tobacco Use  . Smoking status: Former Smoker    Packs/day: 2.00    Years: 17.00    Pack years: 34.00    Types: Cigarettes  . Smokeless tobacco: Never Used  . Tobacco comment: quit when she was 35  Substance and Sexual Activity  . Alcohol use: Yes    Comment: OCCASIONAL  WINE  . Drug use: No  . Sexual activity: Not on file  Other Topics Concern  . Not on file  Social History Narrative  . Not on file   Social Determinants of Health   Financial Resource Strain:   . Difficulty of Paying Living Expenses: Not on file  Food Insecurity:   . Worried About Charity fundraiser in the Last Year: Not on file  . Ran Out of Food in the Last Year: Not on file  Transportation Roach:   . Lack of Transportation (Medical): Not on file  . Lack of Transportation (Non-Medical): Not on file  Physical Activity:   . Days of Exercise per Week: Not on file  . Minutes of Exercise per Session:  Not on file  Stress:   . Feeling of Stress : Not on file  Social Connections:   . Frequency of Communication with Friends and Family: Not on file  . Frequency of Social Gatherings with Friends and Family: Not on file  . Attends Religious Services: Not on file  . Active Member of Clubs or Organizations: Not on file  . Attends Archivist Meetings: Not on file  . Marital Status: Not on file     Family History: The patient's family history includes Breast cancer in her mother; Heart Problems in her father; Heart failure in her paternal grandfather and paternal grandmother. ROS:   Please see the history of present illness.    All other systems reviewed and are negative.  EKGs/Labs/Other Studies Reviewed:    The following studies were reviewed today:  EKG:  EKG ordered today and personally reviewed.  The ekg ordered today demonstrates sinus rhythm normal including QT interval  Recent Labs: 12/27/2018: BUN 17; Creatinine, Ser 0.91; Magnesium 1.7; Potassium 4.7; Sodium 145  Recent Lipid Panel No results found for: CHOL, TRIG, HDL, CHOLHDL, VLDL, LDLCALC, LDLDIRECT  Physical Exam:    VS:  BP (!) 142/62 (BP Location: Left Arm, Patient Position: Sitting, Cuff Size: Normal)   Pulse 82   Ht 5' 4"  (1.626 m)   Wt 180 lb (81.6 kg)   SpO2 97%   BMI 30.90 kg/m     Wt Readings from Last 3 Encounters:  06/20/19 180 lb (81.6 kg)  12/27/18 180 lb (81.6 kg)  05/31/18 177 lb 4 oz (80.4 kg)     GEN:  Well nourished, well developed in no acute distress HEENT: Normal NECK: No JVD; No carotid bruits LYMPHATICS: No lymphadenopathy CARDIAC: RRR, no murmurs, rubs, gallops RESPIRATORY:  Clear to auscultation without rales, wheezing or rhonchi  ABDOMEN: Soft, non-tender, non-distended MUSCULOSKELETAL:  No edema; No deformity  SKIN: Warm and dry NEUROLOGIC:  Alert and oriented x 3 PSYCHIATRIC:  Normal affect    Signed, Shirlee More, MD  06/20/2019 9:52 AM    [facility]

## 2019-06-20 ENCOUNTER — Ambulatory Visit (INDEPENDENT_AMBULATORY_CARE_PROVIDER_SITE_OTHER): Payer: PPO | Admitting: Cardiology

## 2019-06-20 ENCOUNTER — Encounter: Payer: Self-pay | Admitting: Cardiology

## 2019-06-20 ENCOUNTER — Other Ambulatory Visit: Payer: Self-pay

## 2019-06-20 VITALS — BP 142/62 | HR 82 | Ht 64.0 in | Wt 180.0 lb

## 2019-06-20 DIAGNOSIS — R002 Palpitations: Secondary | ICD-10-CM | POA: Diagnosis not present

## 2019-06-20 DIAGNOSIS — E119 Type 2 diabetes mellitus without complications: Secondary | ICD-10-CM | POA: Diagnosis not present

## 2019-06-20 DIAGNOSIS — E782 Mixed hyperlipidemia: Secondary | ICD-10-CM

## 2019-06-20 DIAGNOSIS — I2510 Atherosclerotic heart disease of native coronary artery without angina pectoris: Secondary | ICD-10-CM

## 2019-06-20 DIAGNOSIS — I10 Essential (primary) hypertension: Secondary | ICD-10-CM

## 2019-06-20 NOTE — Patient Instructions (Signed)
Medication Instructions:  Your physician recommends that you continue on your current medications as directed. Please refer to the Current Medication list given to you today.  *If you need a refill on your cardiac medications before your next appointment, please call your pharmacy*  Lab Work: None ordered   If you have labs (blood work) drawn today and your tests are completely normal, you will receive your results only by: Marland Kitchen MyChart Message (if you have MyChart) OR . A paper copy in the mail If you have any lab test that is abnormal or we need to change your treatment, we will call you to review the results.  Testing/Procedures: None ordered   Follow-Up: At [facility], you and your health needs are our priority.  As part of our continuing mission to provide you with exceptional heart care, we have created designated Provider Care Teams.  These Care Teams include your primary Cardiologist (physician) and Advanced Practice Providers (APPs -  Physician Assistants and Nurse Practitioners) who all work together to provide you with the care you need, when you need it.  Your next appointment:   6 month(s)  The format for your next appointment:   In Person  Provider:   Shirlee More, MD  Other Instructions Call us if the top number of your blood pressure while standing is less than 110

## 2019-06-30 DIAGNOSIS — E119 Type 2 diabetes mellitus without complications: Secondary | ICD-10-CM | POA: Diagnosis not present

## 2019-06-30 DIAGNOSIS — E782 Mixed hyperlipidemia: Secondary | ICD-10-CM | POA: Diagnosis not present

## 2019-06-30 DIAGNOSIS — I10 Essential (primary) hypertension: Secondary | ICD-10-CM | POA: Diagnosis not present

## 2019-07-04 DIAGNOSIS — Z0000 Encounter for general adult medical examination without abnormal findings: Secondary | ICD-10-CM | POA: Diagnosis not present

## 2019-07-04 DIAGNOSIS — Z6832 Body mass index (BMI) 32.0-32.9, adult: Secondary | ICD-10-CM | POA: Diagnosis not present

## 2019-07-04 DIAGNOSIS — Z7984 Long term (current) use of oral hypoglycemic drugs: Secondary | ICD-10-CM | POA: Diagnosis not present

## 2019-07-04 DIAGNOSIS — E669 Obesity, unspecified: Secondary | ICD-10-CM | POA: Diagnosis not present

## 2019-07-04 DIAGNOSIS — E782 Mixed hyperlipidemia: Secondary | ICD-10-CM | POA: Diagnosis not present

## 2019-07-04 DIAGNOSIS — F419 Anxiety disorder, unspecified: Secondary | ICD-10-CM | POA: Diagnosis not present

## 2019-07-04 DIAGNOSIS — E119 Type 2 diabetes mellitus without complications: Secondary | ICD-10-CM | POA: Diagnosis not present

## 2019-07-04 DIAGNOSIS — K219 Gastro-esophageal reflux disease without esophagitis: Secondary | ICD-10-CM | POA: Diagnosis not present

## 2019-07-04 DIAGNOSIS — I10 Essential (primary) hypertension: Secondary | ICD-10-CM | POA: Diagnosis not present

## 2019-08-31 ENCOUNTER — Other Ambulatory Visit: Payer: Self-pay | Admitting: Cardiology

## 2019-10-02 DIAGNOSIS — E782 Mixed hyperlipidemia: Secondary | ICD-10-CM | POA: Diagnosis not present

## 2019-11-16 DIAGNOSIS — M542 Cervicalgia: Secondary | ICD-10-CM | POA: Diagnosis not present

## 2019-11-16 DIAGNOSIS — M47812 Spondylosis without myelopathy or radiculopathy, cervical region: Secondary | ICD-10-CM | POA: Diagnosis not present

## 2019-11-16 DIAGNOSIS — S32009K Unspecified fracture of unspecified lumbar vertebra, subsequent encounter for fracture with nonunion: Secondary | ICD-10-CM | POA: Diagnosis not present

## 2019-11-16 DIAGNOSIS — M545 Low back pain: Secondary | ICD-10-CM | POA: Diagnosis not present

## 2019-11-17 ENCOUNTER — Other Ambulatory Visit: Payer: Self-pay | Admitting: Neurosurgery

## 2019-11-17 ENCOUNTER — Telehealth: Payer: Self-pay | Admitting: Nurse Practitioner

## 2019-11-17 DIAGNOSIS — G8929 Other chronic pain: Secondary | ICD-10-CM

## 2019-11-17 DIAGNOSIS — M545 Low back pain, unspecified: Secondary | ICD-10-CM

## 2019-11-17 DIAGNOSIS — M542 Cervicalgia: Secondary | ICD-10-CM

## 2019-11-17 DIAGNOSIS — M546 Pain in thoracic spine: Secondary | ICD-10-CM

## 2019-11-17 NOTE — Telephone Encounter (Signed)
Phone call to patient to verify medication list and allergies for myelogram procedure. Pt instructed to hold Zoloft for 48hrs prior to myelogram appointment time. Pt verbalized understanding. Pre and post procedure instructions reviewed with pt.

## 2019-11-30 ENCOUNTER — Ambulatory Visit
Admission: RE | Admit: 2019-11-30 | Discharge: 2019-11-30 | Disposition: A | Discharge status: Home / Self Care | Payer: PPO | Source: Ambulatory Visit | Attending: Neurosurgery | Admitting: Neurosurgery

## 2019-11-30 VITALS — BP 120/65 | HR 68

## 2019-11-30 DIAGNOSIS — M542 Cervicalgia: Secondary | ICD-10-CM

## 2019-11-30 DIAGNOSIS — M546 Pain in thoracic spine: Secondary | ICD-10-CM

## 2019-11-30 DIAGNOSIS — M48062 Spinal stenosis, lumbar region with neurogenic claudication: Secondary | ICD-10-CM

## 2019-11-30 DIAGNOSIS — M545 Low back pain, unspecified: Secondary | ICD-10-CM

## 2019-11-30 DIAGNOSIS — M5124 Other intervertebral disc displacement, thoracic region: Secondary | ICD-10-CM | POA: Diagnosis not present

## 2019-11-30 DIAGNOSIS — G8929 Other chronic pain: Secondary | ICD-10-CM

## 2019-11-30 DIAGNOSIS — M4327 Fusion of spine, lumbosacral region: Secondary | ICD-10-CM | POA: Diagnosis not present

## 2019-11-30 DIAGNOSIS — M4722 Other spondylosis with radiculopathy, cervical region: Secondary | ICD-10-CM

## 2019-11-30 DIAGNOSIS — M5021 Other cervical disc displacement,  high cervical region: Secondary | ICD-10-CM | POA: Diagnosis not present

## 2019-11-30 DIAGNOSIS — N879 Dysplasia of cervix uteri, unspecified: Secondary | ICD-10-CM

## 2019-11-30 MED ORDER — ONDANSETRON HCL 4 MG/2ML IJ SOLN
4.0000 mg | Freq: Once | INTRAMUSCULAR | Status: AC
  Administered 2019-11-30: 4 mg via INTRAMUSCULAR

## 2019-11-30 MED ORDER — IOPAMIDOL (ISOVUE-M 300) INJECTION 61%
10.0000 mL | Freq: Once | INTRAMUSCULAR | Status: AC | PRN
  Administered 2019-11-30: 10 mL via INTRATHECAL

## 2019-11-30 MED ORDER — DIAZEPAM 5 MG PO TABS
5.0000 mg | ORAL_TABLET | Freq: Once | ORAL | Status: AC
  Administered 2019-11-30: 5 mg via ORAL

## 2019-11-30 MED ORDER — MEPERIDINE HCL 100 MG/ML IJ SOLN
50.0000 mg | Freq: Once | INTRAMUSCULAR | Status: AC
  Administered 2019-11-30: 50 mg via INTRAMUSCULAR

## 2019-11-30 NOTE — Discharge Instructions (Signed)
Myelogram Discharge Instructions  1. Go home and rest quietly for the next 24 hours.  It is important to lie flat for the next 24 hours.  Get up only to go to the restroom.  You may lie in the bed or on a couch on your back, your stomach, your left side or your right side.  You may have one pillow under your head.  You may have pillows between your knees while you are on your side or under your knees while you are on your back.  2. DO NOT drive today.  Recline the seat as far back as it will go, while still wearing your seat belt, on the way home.  3. You may get up to go to the bathroom as needed.  You may sit up for 10 minutes to eat.  You may resume your normal diet and medications unless otherwise indicated.  Drink lots of extra fluids today and tomorrow.  4. The incidence of headache, nausea, or vomiting is about 5% (one in 20 patients).  If you develop a headache, lie flat and drink plenty of fluids until the headache goes away.  Caffeinated beverages may be helpful.  If you develop severe nausea and vomiting or a headache that does not go away with flat bed rest, call [phone].  5. You may resume normal activities after your 24 hours of bed rest is over; however, do not exert yourself strongly or do any heavy lifting tomorrow. If when you get up you have a headache when standing, go back to bed and force fluids for another 24 hours.  6. Call your physician for a follow-up appointment.  The results of your myelogram will be sent directly to your physician by the following day.  7. If you have any questions or if complications develop after you arrive home, please call [phone].  Discharge instructions have been explained to the patient.  The patient, or the person responsible for the patient, fully understands these instructions.  YOU MAY RESTART YOUR ZOLOFT TOMORROW 12/01/19 AT 10:30AM.

## 2019-11-30 NOTE — Progress Notes (Signed)
Patient states she has been off Zoloft for at least the past two days.

## 2019-12-21 DIAGNOSIS — H1132 Conjunctival hemorrhage, left eye: Secondary | ICD-10-CM | POA: Diagnosis not present

## 2019-12-21 DIAGNOSIS — E119 Type 2 diabetes mellitus without complications: Secondary | ICD-10-CM | POA: Diagnosis not present

## 2019-12-21 DIAGNOSIS — H43393 Other vitreous opacities, bilateral: Secondary | ICD-10-CM | POA: Diagnosis not present

## 2019-12-21 DIAGNOSIS — H43813 Vitreous degeneration, bilateral: Secondary | ICD-10-CM | POA: Diagnosis not present

## 2020-01-01 DIAGNOSIS — I209 Angina pectoris, unspecified: Secondary | ICD-10-CM | POA: Diagnosis not present

## 2020-01-01 DIAGNOSIS — E119 Type 2 diabetes mellitus without complications: Secondary | ICD-10-CM | POA: Diagnosis not present

## 2020-01-01 DIAGNOSIS — K219 Gastro-esophageal reflux disease without esophagitis: Secondary | ICD-10-CM | POA: Diagnosis not present

## 2020-01-01 DIAGNOSIS — Z6830 Body mass index (BMI) 30.0-30.9, adult: Secondary | ICD-10-CM | POA: Diagnosis not present

## 2020-01-01 DIAGNOSIS — E669 Obesity, unspecified: Secondary | ICD-10-CM | POA: Diagnosis not present

## 2020-01-01 DIAGNOSIS — F419 Anxiety disorder, unspecified: Secondary | ICD-10-CM | POA: Diagnosis not present

## 2020-01-01 DIAGNOSIS — I10 Essential (primary) hypertension: Secondary | ICD-10-CM | POA: Diagnosis not present

## 2020-01-01 DIAGNOSIS — E782 Mixed hyperlipidemia: Secondary | ICD-10-CM | POA: Diagnosis not present

## 2020-01-09 DIAGNOSIS — M546 Pain in thoracic spine: Secondary | ICD-10-CM | POA: Diagnosis not present

## 2020-01-09 DIAGNOSIS — M5134 Other intervertebral disc degeneration, thoracic region: Secondary | ICD-10-CM | POA: Diagnosis not present

## 2020-01-10 ENCOUNTER — Other Ambulatory Visit: Payer: Self-pay

## 2020-01-10 NOTE — Progress Notes (Signed)
Cardiology Office Note:    Date:  01/11/2020   ID:  Elaine Roach, DOB 11-08-1950, MRN [id number]  PCP:  Algis Greenhouse, MD  Cardiologist:  Shirlee More, MD    Referring MD: Algis Greenhouse, MD    ASSESSMENT:    1. Mild CAD   2. Mixed hyperlipidemia   3. Essential hypertension   4. Type 2 diabetes mellitus without complication, without long-term current use of insulin (HCC)   5. Carotid artery calcification, unspecified laterality    PLAN:    In order of problems listed above:  1. CAD is stable having no anginal discomfort in your current medical therapy and I do not think she requires a repeat ischemia evaluation prior to surgery.  I will send a copy of my note to her neurosurgeon 2. Stable lipids are ideal continue statin and Zetia 3. She has symptomatic orthostatic hypotension discontinue ARB diuretic follow serial blood pressures at home 4. Stable managed by her PCP diabetic neuropathy may also be playing a role 5. Check carotid ultrasound with symptoms of dizziness unsteadiness   Next appointment: 6 weeks   Medication Adjustments/Labs and Tests Ordered: Current medicines are reviewed at length with the patient today.  Concerns regarding medicines are outlined above.  No orders of the defined types were placed in this encounter.  No orders of the defined types were placed in this encounter.   Chief Complaint  Patient presents with  . Follow-up  . Coronary Artery Disease    History of Present Illness:    Elaine Roach is a 69 y.o. female with a hx of  mild CAD with angina, DM2, HTN, HLD, NASH, GERD last seen 05/31/2018. Her last cardiac catheterization was 04/27/2018 with mild-moderate diffuse CAD last seen 06/20/2019.  Compliance with diet, lifestyle and medications: Yes  She is seen today in follow-up for mild CAD and quickly becomes a very complex visit.  She reviews with me she has had symptomatic hypotension her ARB diuretic was reduced and still  having blood pressures in the 80s to 90s symptomatic.  I told her I suspect the mechanism is due to her multilevel spine disease she will stop her ARB completely and for further evaluation where a 3-day ZIO monitor.  She then asked me about calcification on x-rays I stopped low-dose doing came out reviewed her myelogram and CT scans of her spine she has coronary artery calcification I told her she has known CAD but also has carotid calcification and duplex is ordered.  She has had no anginal discomfort.  At times ranolazine can cause dizziness but not hypotension and generally does not cause falls.  I would continue her current medical treatment.  I will see her back in the office in 6 weeks to review her response and results of testing.  Recent labs [facility] primary care physician 01/01/2020: CMP normal except for random glucose 144 GFR mildly reduced 59 cc/min potassium 4.5 creatinine 0.99 and liver function test are normal, TSH normal 1.62 CBC with mild anemia hemoglobin 11.7 lipid profile cholesterol 101 LDL 30 triglyceride 145 HDL 59 HDL cholesterol 51 all are at target.  Past Medical History:  Diagnosis Date  . Arthritis   . Complication of anesthesia    pt. woke up during hand surgery and colonoscopy, cataract surgery  . Diabetes mellitus without complication ([place])    type 2    dx 10 yrs ago  . Dysrhythmia    BENIGN PVC'S  . GERD (gastroesophageal  reflux disease)   . Headache   . History of OCD (obsessive compulsive disorder)    TO A DEGREE  . Hypertension   . Insomnia   . Nonalcoholic steatohepatitis ([facility])    DX IN 1995  . Osteoarthritis   . Pneumonia    walking pneu. in the past  . PONV (postoperative nausea and vomiting)     Past Surgical History:  Procedure Laterality Date  . ANTERIOR CERVICAL DECOMP/DISCECTOMY FUSION  07/19/2017   Procedure: ANTERIOR CERVICAL DECOMPRESSION/DISCECTOMY FUSION , INTERBODY PROSTHESIS, ANTERIOR PLATE CERVICAL SIX- CERVICAL SEVEN,  CERVICAL SEVEN- THORACIC ONE, EXPLORE OLD FUSION;  Surgeon: Newman Pies, MD;  Location: [place];  Service: Neurosurgery;;  . BACK SURGERY     x3  . BUNIONECTOMY     RIGHT  . CARDIAC CATHETERIZATION    . CARPAL TUNNEL RELEASE     RIGHT  . CHOLECYSTECTOMY    . GANGLION CYST EXCISION Right   . LEFT HEART CATH AND CORONARY ANGIOGRAPHY N/A 04/27/2018   Procedure: LEFT HEART CATH AND CORONARY ANGIOGRAPHY;  Surgeon: Leonie Man, MD;  Location: [place] [facility];  Service: Cardiovascular;  Laterality: N/A;  . MYELOGRAM    . SHOULDER ARTHROSCOPY W/ ROTATOR CUFF REPAIR     4 SCOPES.Marland KitchenMarland KitchenALL ON THE RIGHT  . TUBAL LIGATION    . WOUND EXPLORATION  03/19/2015   CERVICAL WOUND   . WOUND EXPLORATION N/A 03/19/2015   Procedure: CERVICAL WOUND EXPLORATION, EVACUATION OF CERVICAL HEMATOMA;  Surgeon: Newman Pies, MD;  Location: [place] [facility];  Service: Neurosurgery;  Laterality: N/A;  cervical wound exploration, evacuation of cervical hematoma    Current Medications: Current Meds  Medication Sig  . aspirin 81 MG chewable tablet Chew by mouth daily.  Marland Kitchen atorvastatin (LIPITOR) 80 MG tablet Take 80 mg by mouth daily.  . cetirizine (ZYRTEC) 5 MG tablet Take 5 mg by mouth daily.  . Cholecalciferol (VITAMIN D3) 5000 units CAPS Take 5,000 Units by mouth daily.  Marland Kitchen diltiazem (CARDIZEM CD) 240 MG 24 hr capsule Take 1 capsule (240 mg total) by mouth daily.  Marland Kitchen ezetimibe (ZETIA) 10 MG tablet Take 1 tablet by mouth daily.  Marland Kitchen glucose blood (PRECISION QID TEST) test strip 1 strip by Misc.(Non-Drug; Combo Route) route daily.  Marland Kitchen ibuprofen (ADVIL,MOTRIN) 200 MG tablet Take 800 mg by mouth daily as needed for moderate pain.  . isosorbide mononitrate (IMDUR) 60 MG 24 hr tablet TAKE 1 TABLET(60 MG) BY MOUTH DAILY  . metFORMIN (GLUCOPHAGE) 500 MG tablet Take 500 mg by mouth 2 (two) times daily with a meal.  . nitroGLYCERIN (NITROSTAT) 0.4 MG SL tablet Place 1 tablet (0.4 mg total) under the tongue every 5 (five)  minutes as needed for chest pain.  Marland Kitchen omeprazole (PRILOSEC) 40 MG capsule Take 40 mg by mouth every morning.   . ranolazine (RANEXA) 500 MG 12 hr tablet TAKE 1 TABLET(500 MG) BY MOUTH TWICE DAILY  . sertraline (ZOLOFT) 100 MG tablet Take 100 mg by mouth at bedtime.  Marland Kitchen zolpidem (AMBIEN) 10 MG tablet Take 10 mg by mouth daily.   . [DISCONTINUED] irbesartan-hydrochlorothiazide (AVALIDE) 300-12.5 MG tablet Take by mouth daily. Take 1/2 tablet once a day     Allergies:   Codeine, Lisinopril, and Tape   Social History   Socioeconomic History  . Marital status: Divorced    Spouse name: Not on file  . Number of children: Not on file  . Years of education: Not on file  . Highest education level:  Not on file  Occupational History  . Not on file  Tobacco Use  . Smoking status: Former Smoker    Packs/day: 2.00    Years: 17.00    Pack years: 34.00    Types: Cigarettes  . Smokeless tobacco: Never Used  . Tobacco comment: quit when she was 35  Vaping Use  . Vaping Use: Never used  Substance and Sexual Activity  . Alcohol use: Yes    Comment: OCCASIONAL  WINE  . Drug use: No  . Sexual activity: Not on file  Other Topics Concern  . Not on file  Social History Narrative  . Not on file   Social Determinants of Health   Financial Resource Strain:   . Difficulty of Paying Living Expenses:   Food Insecurity:   . Worried About Charity fundraiser in the Last Year:   . Arboriculturist in the Last Year:   Transportation Needs:   . Film/video editor (Medical):   Marland Kitchen Lack of Transportation (Non-Medical):   Physical Activity:   . Days of Exercise per Week:   . Minutes of Exercise per Session:   Stress:   . Feeling of Stress :   Social Connections:   . Frequency of Communication with Friends and Family:   . Frequency of Social Gatherings with Friends and Family:   . Attends Religious Services:   . Active Member of Clubs or Organizations:   . Attends Archivist Meetings:     Marland Kitchen Marital Status:      Family History: The patient's family history includes Breast cancer in her mother; Heart Problems in her father; Heart failure in her paternal grandfather and paternal grandmother. ROS:   Please see the history of present illness.    All other systems reviewed and are negative.  EKGs/Labs/Other Studies Reviewed:    The following studies were reviewed today:  EKG:  EKG ordered today and personally reviewed.  The ekg ordered today demonstrates sinus rhythm normal   Physical Exam:    VS:  BP (!) 100/52   Pulse 68   Ht 5' 4"  (1.626 m)   Wt 177 lb (80.3 kg)   SpO2 95%   BMI 30.38 kg/m     Wt Readings from Last 3 Encounters:  01/11/20 177 lb (80.3 kg)  06/20/19 180 lb (81.6 kg)  12/27/18 180 lb (81.6 kg)     GEN: She does not appear chronically ill well nourished, well developed in no acute distress HEENT: Normal NECK: No JVD; No carotid bruits LYMPHATICS: No lymphadenopathy CARDIAC: RRR, no murmurs, rubs, gallops RESPIRATORY:  Clear to auscultation without rales, wheezing or rhonchi  ABDOMEN: Soft, non-tender, non-distended MUSCULOSKELETAL:  No edema; No deformity  SKIN: Warm and dry NEUROLOGIC:  Alert and oriented x 3 PSYCHIATRIC:  Normal affect    Signed, Shirlee More, MD  01/11/2020 8:29 AM    [facility]

## 2020-01-11 ENCOUNTER — Ambulatory Visit (INDEPENDENT_AMBULATORY_CARE_PROVIDER_SITE_OTHER): Payer: PPO | Admitting: Cardiology

## 2020-01-11 ENCOUNTER — Other Ambulatory Visit: Payer: Self-pay

## 2020-01-11 ENCOUNTER — Ambulatory Visit (INDEPENDENT_AMBULATORY_CARE_PROVIDER_SITE_OTHER): Payer: PPO

## 2020-01-11 ENCOUNTER — Encounter: Payer: Self-pay | Admitting: Cardiology

## 2020-01-11 VITALS — BP 100/52 | HR 68 | Ht 64.0 in | Wt 177.0 lb

## 2020-01-11 DIAGNOSIS — I6529 Occlusion and stenosis of unspecified carotid artery: Secondary | ICD-10-CM | POA: Diagnosis not present

## 2020-01-11 DIAGNOSIS — R42 Dizziness and giddiness: Secondary | ICD-10-CM | POA: Diagnosis not present

## 2020-01-11 DIAGNOSIS — I10 Essential (primary) hypertension: Secondary | ICD-10-CM

## 2020-01-11 DIAGNOSIS — I2510 Atherosclerotic heart disease of native coronary artery without angina pectoris: Secondary | ICD-10-CM

## 2020-01-11 DIAGNOSIS — E782 Mixed hyperlipidemia: Secondary | ICD-10-CM

## 2020-01-11 DIAGNOSIS — E119 Type 2 diabetes mellitus without complications: Secondary | ICD-10-CM | POA: Diagnosis not present

## 2020-01-11 NOTE — Patient Instructions (Signed)
Medication Instructions:  Your physician has recommended you make the following change in your medication:  STOP: Avalide *If you need a refill on your cardiac medications before your next appointment, please call your pharmacy*   Lab Work: None If you have labs (blood work) drawn today and your tests are completely normal, you will receive your results only by: Marland Kitchen MyChart Message (if you have MyChart) OR . A paper copy in the mail If you have any lab test that is abnormal or we need to change your treatment, we will call you to review the results.   Testing/Procedures: Your physician has requested that you have a carotid duplex. This test is an ultrasound of the carotid arteries in your neck. It looks at blood flow through these arteries that supply the brain with blood. Allow one hour for this exam. There are no restrictions or special instructions.  A zio monitor was ordered today. It will remain on for 3 days. You will then return monitor and event diary in provided box. It takes 1-2 weeks for report to be downloaded and returned to [place]. We will call you with the results. If monitor falls off or has orange flashing light, please call Zio for further instructions.     Follow-Up: At [facility], you and your health needs are our priority.  As part of our continuing mission to provide you with exceptional heart care, we have created designated Provider Care Teams.  These Care Teams include your primary Cardiologist (physician) and Advanced Practice Providers (APPs -  Physician Assistants and Nurse Practitioners) who all work together to provide you with the care you need, when you need it.  We recommend signing up for the patient portal called "MyChart".  Sign up information is provided on this After Visit Summary.  MyChart is used to connect with patients for Virtual Visits (Telemedicine).  Patients are able to view lab/test results, encounter notes, upcoming appointments, etc.  Non-urgent  messages can be sent to your provider as well.   To learn more about what you can do with MyChart, go to [email or web address].    Your next appointment:   6 week(s)  The format for your next appointment:   In Person  Provider:   Shirlee More, MD   Other Instructions

## 2020-01-18 DIAGNOSIS — H524 Presbyopia: Secondary | ICD-10-CM | POA: Diagnosis not present

## 2020-01-18 DIAGNOSIS — H04123 Dry eye syndrome of bilateral lacrimal glands: Secondary | ICD-10-CM | POA: Diagnosis not present

## 2020-01-24 DIAGNOSIS — Z01419 Encounter for gynecological examination (general) (routine) without abnormal findings: Secondary | ICD-10-CM | POA: Diagnosis not present

## 2020-01-24 DIAGNOSIS — Z124 Encounter for screening for malignant neoplasm of cervix: Secondary | ICD-10-CM | POA: Diagnosis not present

## 2020-01-24 DIAGNOSIS — Z1231 Encounter for screening mammogram for malignant neoplasm of breast: Secondary | ICD-10-CM | POA: Diagnosis not present

## 2020-01-25 DIAGNOSIS — R42 Dizziness and giddiness: Secondary | ICD-10-CM | POA: Diagnosis not present

## 2020-01-30 ENCOUNTER — Ambulatory Visit (INDEPENDENT_AMBULATORY_CARE_PROVIDER_SITE_OTHER): Payer: PPO

## 2020-01-30 ENCOUNTER — Other Ambulatory Visit: Payer: Self-pay

## 2020-01-30 DIAGNOSIS — I6522 Occlusion and stenosis of left carotid artery: Secondary | ICD-10-CM

## 2020-01-30 DIAGNOSIS — I6529 Occlusion and stenosis of unspecified carotid artery: Secondary | ICD-10-CM

## 2020-01-30 NOTE — Progress Notes (Signed)
Carotid duplex exam has been performed.  Jimmy Tashanti Dalporto RDCS, RVT

## 2020-02-02 ENCOUNTER — Telehealth: Payer: Self-pay

## 2020-02-02 NOTE — Telephone Encounter (Signed)
-----   Message from Richardo Priest, MD sent at 02/02/2020  3:43 PM EDT ----- Normal or stable result  She has a couple of extra beats that she notices at times but really this is a good report and is very reassuring

## 2020-02-02 NOTE — Telephone Encounter (Signed)
Left message on patients voicemail to please return our call.   

## 2020-02-05 ENCOUNTER — Telehealth: Payer: Self-pay

## 2020-02-05 NOTE — Telephone Encounter (Signed)
-----   Message from Richardo Priest, MD sent at 02/05/2020  9:33 AM EDT ----- Yes who is surgeon and what is the  procedure

## 2020-02-05 NOTE — Telephone Encounter (Signed)
Spoke to the patient just now and she let me know that the surgery is not currently scheduled. I advised for her to have them to send [place] a pre-op clearance form once she is seen by the surgeon and a surgery plan was set into place. She verbalizes understanding and thanks me for the call back.    Encouraged patient to call back with any questions or concerns.

## 2020-02-07 ENCOUNTER — Other Ambulatory Visit: Payer: Self-pay | Admitting: Neurosurgery

## 2020-02-15 ENCOUNTER — Other Ambulatory Visit: Payer: Self-pay | Admitting: Family Medicine

## 2020-02-15 DIAGNOSIS — Z1231 Encounter for screening mammogram for malignant neoplasm of breast: Secondary | ICD-10-CM

## 2020-02-18 ENCOUNTER — Other Ambulatory Visit: Payer: Self-pay | Admitting: Cardiology

## 2020-02-24 ENCOUNTER — Other Ambulatory Visit: Payer: Self-pay | Admitting: Cardiology

## 2020-02-26 NOTE — Progress Notes (Signed)
Cardiology Office Note:    Date:  02/27/2020   ID:  Elaine Roach, DOB [birth year], MRN [id number]  PCP:  Algis Greenhouse, MD  Cardiologist:  Shirlee More, MD    Referring MD: Algis Greenhouse, MD    ASSESSMENT:    1. Orthostatic hypotension   2. Essential hypertension   3. Mild CAD   4. Mixed hyperlipidemia   5. Carotid artery calcification, unspecified laterality    PLAN:    In order of problems listed above:  1. He is improved blood pressure generally is in range she will take in a low potency ARB as needed for systolics greater than 536. 2. CAD is stable new prescription for nitroglycerin.  Continue treatment including aspirin statin calcium channel blocker Imdur and ranolazine 3. Stable continue combined treatment ranolazine and Zetia 4. Stable continue medical therapy no severe obstructive carotid disease   Next appointment: Next months   Medication Adjustments/Labs and Tests Ordered: Current medicines are reviewed at length with the patient today.  Concerns regarding medicines are outlined above.  No orders of the defined types were placed in this encounter.  Meds ordered this encounter  Medications  . losartan (COZAAR) 50 MG tablet    Sig: Take 1 tablet (50 mg total) by mouth daily as needed. Take if your Blood Pressure is greater than 160 on top.    Dispense:  90 tablet    Refill:  3  . nitroGLYCERIN (NITROSTAT) 0.4 MG SL tablet    Sig: Place 1 tablet (0.4 mg total) under the tongue every 5 (five) minutes as needed for chest pain.    Dispense:  25 tablet    Refill:  11    Chief Complaint  Patient presents with  . Follow-up  . Hypotension  . Hypertension  . Hyperlipidemia    History of Present Illness:    Elaine Roach is a 69 y.o. female with a hx of mild CAD with angina, DM2, HTN, and HLD  last seen 01/11/2020 with orthostatic hypotension. Compliance with diet, lifestyle and medications: Yes  I reviewed her testing with her She is much  improved no longer having symptomatic episodes of orthostatic hypotension.  She has made a list of blood pressures since a mixture of sitting and standing predominantly in the range of 1 14-4 40 systolic over 60 standing blood pressure with me 150/60.  I Georgina Peer give her a prescription for low potency ARB she can take for morning blood pressures greater than 160.  No further palpitation syncope.  She complains of intermittent breathless nonexertional some chest tightness not severe sustained.  Carotid duplex 01/30/2020: Summary:  Right Carotid: There is no evidence of stenosis in the right ICA.  Left Carotid: Velocities in the left ICA are consistent with a 40-59% stenosis.  Vertebrals: Bilateral vertebral arteries demonstrate antegrade flow.  Subclavians: Normal flow hemodynamics were seen in bilateral subclavian        arteries.   ZIO monitor was performed for 4 days beginning 01/11/2020 in order to assess palpitation. The cardiac rhythm throughout was sinus with minimum average and maximum heart rates of 60, 78 and 126 bpm. There were no pauses of 3 seconds or greater and no episodes of second or third-degree AV nodal block or sinus node exit block. There were 6 triggered events 1 was associated with rare APC 2 were associated with rare PVCs. Ventricular ectopy was rare with isolated PVCs Supraventricular ectopy is rare without episodes of atrial fibrillation or flutter.  Conclusion unremarkable event monitor however 3 the triggered events were associated with isolated PVCs or APCs.  Recent labs [facility] primary care physician 01/01/2020: CMP normal except for random glucose 144 GFR mildly reduced 59 cc/min potassium 4.5 creatinine 0.99 and liver function test are normal, TSH normal 1.62 CBC with mild anemia hemoglobin 11.7 lipid profile cholesterol 101 LDL 30 triglyceride 145 HDL 59 HDL cholesterol 51 all are at target.  Past Medical History:  Diagnosis Date  . Arthritis    . Complication of anesthesia    pt. woke up during hand surgery and colonoscopy, cataract surgery  . Diabetes mellitus without complication ([place])    type 2    dx 10 yrs ago  . Dysrhythmia    BENIGN PVC'S  . GERD (gastroesophageal reflux disease)   . Headache   . History of OCD (obsessive compulsive disorder)    TO A DEGREE  . Hypertension   . Insomnia   . Nonalcoholic steatohepatitis ([facility])    DX IN 1995  . Osteoarthritis   . Pneumonia    walking pneu. in the past  . PONV (postoperative nausea and vomiting)     Past Surgical History:  Procedure Laterality Date  . ANTERIOR CERVICAL DECOMP/DISCECTOMY FUSION  07/19/2017   Procedure: ANTERIOR CERVICAL DECOMPRESSION/DISCECTOMY FUSION , INTERBODY PROSTHESIS, ANTERIOR PLATE CERVICAL SIX- CERVICAL SEVEN, CERVICAL SEVEN- THORACIC ONE, EXPLORE OLD FUSION;  Surgeon: Newman Pies, MD;  Location: [facility];  Service: Neurosurgery;;  . BACK SURGERY     x3  . BUNIONECTOMY     RIGHT  . CARDIAC CATHETERIZATION    . CARPAL TUNNEL RELEASE     RIGHT  . CHOLECYSTECTOMY    . GANGLION CYST EXCISION Right   . LEFT HEART CATH AND CORONARY ANGIOGRAPHY N/A 04/27/2018   Procedure: LEFT HEART CATH AND CORONARY ANGIOGRAPHY;  Surgeon: Leonie Man, MD;  Location: [place] [facility];  Service: Cardiovascular;  Laterality: N/A;  . MYELOGRAM    . SHOULDER ARTHROSCOPY W/ ROTATOR CUFF REPAIR     4 SCOPES.Marland KitchenMarland KitchenALL ON THE RIGHT  . TUBAL LIGATION    . WOUND EXPLORATION  03/19/2015   CERVICAL WOUND   . WOUND EXPLORATION N/A 03/19/2015   Procedure: CERVICAL WOUND EXPLORATION, EVACUATION OF CERVICAL HEMATOMA;  Surgeon: Newman Pies, MD;  Location: [facility];  Service: Neurosurgery;  Laterality: N/A;  cervical wound exploration, evacuation of cervical hematoma    Current Medications: Current Meds  Medication Sig  . aspirin 81 MG chewable tablet Chew by mouth daily.  Marland Kitchen atorvastatin (LIPITOR) 80 MG tablet Take 80 mg by mouth daily.  . cetirizine (ZYRTEC)  5 MG tablet Take 5 mg by mouth daily.  . Cholecalciferol (VITAMIN D3) 5000 units CAPS Take 5,000 Units by mouth daily.  Marland Kitchen diltiazem (CARDIZEM CD) 240 MG 24 hr capsule TAKE 1 CAPSULE BY MOUTH DAILY  . ezetimibe (ZETIA) 10 MG tablet Take 1 tablet by mouth daily.  Marland Kitchen glucose blood (PRECISION QID TEST) test strip 1 strip by Misc.(Non-Drug; Combo Route) route daily.  Marland Kitchen ibuprofen (ADVIL,MOTRIN) 200 MG tablet Take 800 mg by mouth daily as needed for moderate pain.  . isosorbide mononitrate (IMDUR) 60 MG 24 hr tablet TAKE 1 TABLET(60 MG) BY MOUTH DAILY  . metFORMIN (GLUCOPHAGE) 500 MG tablet Take 500 mg by mouth 2 (two) times daily with a meal.  . nitroGLYCERIN (NITROSTAT) 0.4 MG SL tablet Place 1 tablet (0.4 mg total) under the tongue every 5 (five) minutes as needed for chest pain.  Marland Kitchen  omeprazole (PRILOSEC) 40 MG capsule Take 40 mg by mouth every morning.   . ranolazine (RANEXA) 500 MG 12 hr tablet TAKE 1 TABLET(500 MG) BY MOUTH TWICE DAILY  . sertraline (ZOLOFT) 100 MG tablet Take 100 mg by mouth at bedtime.  Marland Kitchen zolpidem (AMBIEN) 10 MG tablet Take 10 mg by mouth daily.   . [DISCONTINUED] nitroGLYCERIN (NITROSTAT) 0.4 MG SL tablet Place 1 tablet (0.4 mg total) under the tongue every 5 (five) minutes as needed for chest pain.     Allergies:   Codeine, Lisinopril, and Tape   Social History   Socioeconomic History  . Marital status: Divorced    Spouse name: Not on file  . Number of children: Not on file  . Years of education: Not on file  . Highest education level: Not on file  Occupational History  . Not on file  Tobacco Use  . Smoking status: Former Smoker    Packs/day: 2.00    Years: 17.00    Pack years: 34.00    Types: Cigarettes  . Smokeless tobacco: Never Used  . Tobacco comment: quit when she was 35  Vaping Use  . Vaping Use: Never used  Substance and Sexual Activity  . Alcohol use: Yes    Comment: OCCASIONAL  WINE  . Drug use: No  . Sexual activity: Not on file  Other Topics  Concern  . Not on file  Social History Narrative  . Not on file   Social Determinants of Health   Financial Resource Strain:   . Difficulty of Paying Living Expenses: Not on file  Food Insecurity:   . Worried About Charity fundraiser in the Last Year: Not on file  . Ran Out of Food in the Last Year: Not on file  Transportation Needs:   . Lack of Transportation (Medical): Not on file  . Lack of Transportation (Non-Medical): Not on file  Physical Activity:   . Days of Exercise per Week: Not on file  . Minutes of Exercise per Session: Not on file  Stress:   . Feeling of Stress : Not on file  Social Connections:   . Frequency of Communication with Friends and Family: Not on file  . Frequency of Social Gatherings with Friends and Family: Not on file  . Attends Religious Services: Not on file  . Active Member of Clubs or Organizations: Not on file  . Attends Archivist Meetings: Not on file  . Marital Status: Not on file     Family History: The patient's family history includes Breast cancer in her mother; Heart Problems in her father; Heart failure in her paternal grandfather and paternal grandmother. ROS:   Please see the history of present illness.    All other systems reviewed and are negative.  EKGs/Labs/Other Studies Reviewed:    The following studies were reviewed today:    Recent Labs: No results found for requested labs within last 8760 hours.  Recent Lipid Panel No results found for: CHOL, TRIG, HDL, CHOLHDL, VLDL, LDLCALC, LDLDIRECT  Physical Exam:    VS:  BP 134/78 (BP Location: Right Arm, Patient Position: Sitting, Cuff Size: Normal)   Pulse 75   Ht 5' 4"  (1.626 m)   Wt 178 lb 9.6 oz (81 kg)   SpO2 95%   BMI 30.66 kg/m     Wt Readings from Last 3 Encounters:  02/27/20 178 lb 9.6 oz (81 kg)  01/11/20 177 lb (80.3 kg)  06/20/19 180 lb (81.6 kg)  GEN:  Well nourished, well developed in no acute distress HEENT: Normal NECK: No JVD; No  carotid bruits LYMPHATICS: No lymphadenopathy CARDIAC: RRR, no murmurs, rubs, gallops RESPIRATORY:  Clear to auscultation without rales, wheezing or rhonchi  ABDOMEN: Soft, non-tender, non-distended MUSCULOSKELETAL:  No edema; No deformity  SKIN: Warm and dry NEUROLOGIC:  Alert and oriented x 3 PSYCHIATRIC:  Normal affect    Signed, Shirlee More, MD  02/27/2020 10:08 AM    [place]

## 2020-02-27 ENCOUNTER — Encounter: Payer: Self-pay | Admitting: Cardiology

## 2020-02-27 ENCOUNTER — Other Ambulatory Visit: Payer: Self-pay

## 2020-02-27 ENCOUNTER — Ambulatory Visit: Payer: PPO | Admitting: Cardiology

## 2020-02-27 VITALS — BP 134/78 | HR 75 | Ht 64.0 in | Wt 178.6 lb

## 2020-02-27 DIAGNOSIS — I6529 Occlusion and stenosis of unspecified carotid artery: Secondary | ICD-10-CM

## 2020-02-27 DIAGNOSIS — I10 Essential (primary) hypertension: Secondary | ICD-10-CM

## 2020-02-27 DIAGNOSIS — E782 Mixed hyperlipidemia: Secondary | ICD-10-CM

## 2020-02-27 DIAGNOSIS — I951 Orthostatic hypotension: Secondary | ICD-10-CM

## 2020-02-27 DIAGNOSIS — I2510 Atherosclerotic heart disease of native coronary artery without angina pectoris: Secondary | ICD-10-CM | POA: Diagnosis not present

## 2020-02-27 MED ORDER — LOSARTAN POTASSIUM 50 MG PO TABS
50.0000 mg | ORAL_TABLET | Freq: Every day | ORAL | 3 refills | Status: DC | PRN
Start: 2020-02-27 — End: 2020-08-16

## 2020-02-27 MED ORDER — NITROGLYCERIN 0.4 MG SL SUBL: 0.4000 mg | SUBLINGUAL_TABLET | SUBLINGUAL | 11 refills | Status: DC | PRN

## 2020-02-27 NOTE — Patient Instructions (Signed)
Medication Instructions:  Your physician has recommended you make the following change in your medication:  START: Cozaar 50 mg take one tablet by mouth daily as needed. Only take this if your blood pressure is greater than 160 on top.  *If you need a refill on your cardiac medications before your next appointment, please call your pharmacy*   Lab Work: None If you have labs (blood work) drawn today and your tests are completely normal, you will receive your results only by: Marland Kitchen MyChart Message (if you have MyChart) OR . A paper copy in the mail If you have any lab test that is abnormal or we need to change your treatment, we will call you to review the results.   Testing/Procedures: None   Follow-Up: At [facility], you and your health needs are our priority.  As part of our continuing mission to provide you with exceptional heart care, we have created designated Provider Care Teams.  These Care Teams include your primary Cardiologist (physician) and Advanced Practice Providers (APPs -  Physician Assistants and Nurse Practitioners) who all work together to provide you with the care you need, when you need it.  We recommend signing up for the patient portal called "MyChart".  Sign up information is provided on this After Visit Summary.  MyChart is used to connect with patients for Virtual Visits (Telemedicine).  Patients are able to view lab/test results, encounter notes, upcoming appointments, etc.  Non-urgent messages can be sent to your provider as well.   To learn more about what you can do with MyChart, go to [email or web address].    Your next appointment:   6 month(s)  The format for your next appointment:   In Person  Provider:   Shirlee More, MD    Other Instructions

## 2020-02-29 ENCOUNTER — Other Ambulatory Visit: Payer: Self-pay | Admitting: Neurosurgery

## 2020-03-04 ENCOUNTER — Telehealth: Payer: Self-pay | Admitting: Cardiology

## 2020-03-04 DIAGNOSIS — M545 Low back pain: Secondary | ICD-10-CM | POA: Diagnosis not present

## 2020-03-04 DIAGNOSIS — R944 Abnormal results of kidney function studies: Secondary | ICD-10-CM | POA: Diagnosis not present

## 2020-03-04 NOTE — Telephone Encounter (Signed)
Patient wants to know if we received the surgical clearance form that was faxed over from Dr. Arnoldo Morale office. Please advise.

## 2020-03-08 NOTE — Telephone Encounter (Signed)
Left voicemail message that I would try to call back later.

## 2020-03-11 NOTE — Telephone Encounter (Signed)
Called patient informed her that we have not received clearance form. Confirmed our fax number with her she will call them again and ask them to resend.

## 2020-03-12 ENCOUNTER — Telehealth: Payer: Self-pay

## 2020-03-12 NOTE — Telephone Encounter (Signed)
   Primary Cardiologist: Shirlee More, MD  Chart reviewed as part of pre-operative protocol coverage. The patient was doing well when seen by Dr. Loney Hering 02/27/20. Given past medical history and time since last visit, based on ACC/AHA guidelines, Elaine Roach would be at acceptable risk for the planned procedure without further cardiovascular testing.   The patient was advised that if she develops new symptoms prior to surgery to contact our office to arrange for a follow-up visit, and she verbalized understanding.  I will route this recommendation to the requesting party via Epic fax function and remove from pre-op pool.  Please call with questions.  [place], [place] 03/12/2020, 3:11 PM

## 2020-03-12 NOTE — Telephone Encounter (Signed)
   [facility] Pre-operative Risk Assessment    HEARTCARE STAFF: - Please ensure there is not already an duplicate clearance open for this procedure. - Under Visit Info/Reason for Call, type in Other and utilize the format Clearance MM/DD/YY or Clearance TBD. Do not use dashes or single digits. - If request is for dental extraction, please clarify the # of teeth to be extracted.  Request for surgical clearance:  1. What type of surgery is being performed? Thoracolumbar Fusion/General Anesthesia   2. When is this surgery scheduled? 03/25/2020   3. What type of clearance is required (medical clearance vs. Pharmacy clearance to hold med vs. Both)? Both  4. Are there any medications that need to be held prior to surgery and how long? None specified    5. Practice name and name of physician performing surgery? [facility]. Dr. Newman Pies   6. What is the office phone number? [phone]   7.   What is the office fax number? [phone]  8.   Anesthesia type (None, local, MAC, general) ? General   Elaine Roach 03/12/2020, 10:19 AM  _________________________________________________________________   (provider comments below)

## 2020-03-20 NOTE — Progress Notes (Signed)
[facility] [place] AT [address] [place] [address] Phone: [phone] Fax: [phone]      Your procedure is scheduled on Monday, Oct. 11th.  Report to [facility] Main Entrance "A" at 9:30 A.M., and check in at the Admitting office.  Call this number if you have problems the morning of surgery:  [phone]  Call [phone] if you have any questions prior to your surgery date Monday-Friday 8am-4pm    Remember:  Do not eat or drink after midnight the night before your surgery    Take these medicines the morning of surgery with A SIP OF WATER   Atorvastatin (lipitor)  Zyrtec - if needed  Diltiazem  Ezetimibe (Zetia)  Eye drops - if needed  Isosorbide Mononitrate (Imdur)  Nitroglycerin tablet - if needed  Omeprazole (Prilosec)  Ranolzaine (Ranexa)     As of today, STOP taking any Aspirin (unless otherwise instructed by your surgeon) Aleve, Naproxen, Ibuprofen, Motrin, Advil, Goody's, BC's, all herbal medications, fish oil, and all vitamins.   WHAT DO I DO ABOUT MY DIABETES MEDICATION?   Marland Kitchen Do not take oral diabetes medicines (pills) the morning of surgery. - Metformin    HOW TO MANAGE YOUR DIABETES BEFORE AND AFTER SURGERY  Why is it important to control my blood sugar before and after surgery? . Improving blood sugar levels before and after surgery helps healing and can limit problems. . A way of improving blood sugar control is eating a healthy diet by: o  Eating less sugar and carbohydrates o  Increasing activity/exercise o  Talking with your doctor about reaching your blood sugar goals . High blood sugars (greater than 180 mg/dL) can raise your risk of infections and slow your recovery, so you will need to focus on controlling your diabetes during the weeks before surgery. . Make sure that the doctor who takes care of your diabetes knows about your planned surgery including  the date and location.  How do I manage my blood sugar before surgery? . Check your blood sugar at least 4 times a day, starting 2 days before surgery, to make sure that the level is not too high or low. . Check your blood sugar the morning of your surgery when you wake up and every 2 hours until you get to the Short Stay unit. o If your blood sugar is less than 70 mg/dL, you will need to treat for low blood sugar: - Do not take insulin. - Treat a low blood sugar (less than 70 mg/dL) with  cup of clear juice (cranberry or apple), 4 glucose tablets, OR glucose gel. - Recheck blood sugar in 15 minutes after treatment (to make sure it is greater than 70 mg/dL). If your blood sugar is not greater than 70 mg/dL on recheck, call [phone] for further instructions. . Report your blood sugar to the short stay nurse when you get to Short Stay.  . If you are admitted to the hospital after surgery: o Your blood sugar will be checked by the staff and you will probably be given insulin after surgery (instead of oral diabetes medicines) to make sure you have good blood sugar levels. o The goal for blood sugar control after surgery is 80-180 mg/dL.               DAY OF SURGERY  Do not wear jewelry, make up, or nail polish            Do not wear lotions, powders, perfumes, or deodorant.            Do not shave 48 hours prior to surgery.              Do not bring valuables to the hospital.            [facility] is not responsible for any belongings or valuables.  Do NOT Smoke (Tobacco/Vaping) or drink Alcohol 24 hours prior to your procedure If you use a CPAP at night, you may bring all equipment for your overnight stay.   Contacts, glasses, dentures or bridgework may not be worn into surgery.      For patients admitted to the hospital, discharge time will be determined by your treatment team.   Patients discharged the day of surgery will not be allowed to drive home, and someone  needs to stay with them for 24 hours.    Special instructions:   [place]- Preparing For Surgery  Before surgery, you can play an important role. Because skin is not sterile, your skin needs to be as free of germs as possible. You can reduce the number of germs on your skin by washing with CHG (chlorahexidine gluconate) Soap before surgery.  CHG is an antiseptic cleaner which kills germs and bonds with the skin to continue killing germs even after washing.    Oral Hygiene is also important to reduce your risk of infection.  Remember - BRUSH YOUR TEETH THE MORNING OF SURGERY WITH YOUR REGULAR TOOTHPASTE  Please do not use if you have an allergy to CHG or antibacterial soaps. If your skin becomes reddened/irritated stop using the CHG.  Do not shave (including legs and underarms) for at least 48 hours prior to first CHG shower. It is OK to shave your face.  Please follow these instructions carefully.   1. Shower the NIGHT BEFORE SURGERY and the MORNING OF SURGERY with CHG Soap.   2. If you chose to wash your hair, wash your hair first as usual with your normal shampoo.  3. After you shampoo, rinse your hair and body thoroughly to remove the shampoo.  4. Use CHG as you would any other liquid soap. You can apply CHG directly to the skin and wash gently with a scrungie or a clean washcloth.   5. Apply the CHG Soap to your body ONLY FROM THE NECK DOWN.  Do not use on open wounds or open sores. Avoid contact with your eyes, ears, mouth and genitals (private parts). Wash Face and genitals (private parts)  with your normal soap.   6. Wash thoroughly, paying special attention to the area where your surgery will be performed.  7. Thoroughly rinse your body with warm water from the neck down.  8. DO NOT shower/wash with your normal soap after using and rinsing off the CHG Soap.  9. Pat yourself dry with a CLEAN TOWEL.  10. Wear CLEAN PAJAMAS to bed the night before surgery  11. Place CLEAN  SHEETS on your bed the night of your first shower and DO NOT SLEEP WITH PETS.   Day of Surgery: Wear Clean/Comfortable clothing the morning of surgery Do not apply any deodorants/lotions.   Remember to brush your teeth WITH YOUR REGULAR TOOTHPASTE.   Please read over the following fact sheets that you were given.

## 2020-03-21 ENCOUNTER — Other Ambulatory Visit: Payer: Self-pay

## 2020-03-21 ENCOUNTER — Encounter (HOSPITAL_COMMUNITY)
Admission: RE | Admit: 2020-03-21 | Discharge: 2020-03-21 | Disposition: A | Discharge status: Home / Self Care | Payer: PPO | Source: Ambulatory Visit | Attending: Neurosurgery | Admitting: Neurosurgery

## 2020-03-21 ENCOUNTER — Encounter (HOSPITAL_COMMUNITY): Payer: Self-pay

## 2020-03-21 ENCOUNTER — Other Ambulatory Visit (HOSPITAL_COMMUNITY)
Admission: RE | Admit: 2020-03-21 | Discharge: 2020-03-21 | Disposition: A | Discharge status: Home / Self Care | Payer: PPO | Source: Ambulatory Visit | Attending: Neurosurgery | Admitting: Neurosurgery

## 2020-03-21 DIAGNOSIS — Z20822 Contact with and (suspected) exposure to covid-19: Secondary | ICD-10-CM | POA: Insufficient documentation

## 2020-03-21 DIAGNOSIS — Z01818 Encounter for other preprocedural examination: Secondary | ICD-10-CM | POA: Insufficient documentation

## 2020-03-21 DIAGNOSIS — Z01812 Encounter for preprocedural laboratory examination: Secondary | ICD-10-CM | POA: Insufficient documentation

## 2020-03-21 HISTORY — DX: Occlusion and stenosis of unspecified carotid artery: I65.29

## 2020-03-21 HISTORY — DX: Mixed hyperlipidemia: E78.2

## 2020-03-21 HISTORY — DX: Atherosclerotic heart disease of native coronary artery without angina pectoris: I25.10

## 2020-03-21 LAB — COMPREHENSIVE METABOLIC PANEL
ALT: 17 U/L (ref 0–44)
AST: 18 U/L (ref 15–41)
Albumin: 3.9 g/dL (ref 3.5–5.0)
Alkaline Phosphatase: 74 U/L (ref 38–126)
Anion gap: 12 (ref 5–15)
BUN: 15 mg/dL (ref 8–23)
CO2: 25 mmol/L (ref 22–32)
Calcium: 9.3 mg/dL (ref 8.9–10.3)
Chloride: 104 mmol/L (ref 98–111)
Creatinine, Ser: 0.85 mg/dL (ref 0.44–1.00)
GFR calc non Af Amer: >60 mL/min (ref >60)
Glucose, Bld: 120 mg/dL — ABNORMAL HIGH (ref 70–99)
Potassium: 3.5 mmol/L (ref 3.5–5.1)
Sodium: 141 mmol/L (ref 135–145)
Total Bilirubin: 1.2 mg/dL (ref 0.3–1.2)
Total Protein: 6.6 g/dL (ref 6.5–8.1)

## 2020-03-21 LAB — CBC
HCT: 35.6 % — ABNORMAL LOW (ref 36.0–46.0)
Hemoglobin: 10.8 g/dL — ABNORMAL LOW (ref 12.0–15.0)
MCH: 26 pg (ref 26.0–34.0)
MCHC: 30.3 g/dL (ref 30.0–36.0)
MCV: 85.6 fL (ref 80.0–100.0)
Platelets: 285 10*3/uL (ref 150–400)
RBC: 4.16 MIL/uL (ref 3.87–5.11)
RDW: 14 % (ref 11.5–15.5)
WBC: 5.9 10*3/uL (ref 4.0–10.5)
nRBC: 0 % (ref 0.0–0.2)

## 2020-03-21 LAB — GLUCOSE, CAPILLARY: Glucose-Capillary: 120 mg/dL — ABNORMAL HIGH (ref 70–99)

## 2020-03-21 LAB — TYPE AND SCREEN
ABO/RH(D): A POS
Antibody Screen: NEGATIVE

## 2020-03-21 LAB — HEMOGLOBIN A1C
Hgb A1c MFr Bld: 6.1 % — ABNORMAL HIGH (ref 4.8–5.6)
Mean Plasma Glucose: 128.37 mg/dL

## 2020-03-21 LAB — SURGICAL PCR SCREEN
MRSA, PCR: NEGATIVE
Staphylococcus aureus: NEGATIVE

## 2020-03-21 LAB — SARS CORONAVIRUS 2 (TAT 6-24 HRS): SARS Coronavirus 2: NEGATIVE

## 2020-03-21 NOTE — Progress Notes (Signed)
PCP - Dr. Garlon Hatchet Cardiologist - Dr. Bettina Gavia  PPM/ICD - n/a Device Orders -  Rep Notified -   Chest x-ray - n/a EKG - 01/11/2020 Stress Test -  ECHO -  Cardiac Cath - 2019  Sleep Study - n/a CPAP -   Fasting Blood Sugar - unsure, does not check CBG Checks Blood Sugar _____ times a day  Blood Thinner Instructions: n/a Aspirin Instructions: follow surgeons instructions  ERAS Protcol - n/a PRE-SURGERY Ensure or G2-   COVID TEST- after PAT appointment 03/21/20   Anesthesia review: yes, hx of CAD, abnormal EKG  Patient denies shortness of breath, fever, cough and chest pain at PAT appointment   All instructions explained to the patient, with a verbal understanding of the material. Patient agrees to go over the instructions while at home for a better understanding. Patient also instructed to self quarantine after being tested for COVID-19. The opportunity to ask questions was provided.

## 2020-03-22 NOTE — Anesthesia Preprocedure Evaluation (Deleted)
Anesthesia Evaluation    Airway        Dental   Pulmonary former smoker,           Cardiovascular hypertension,      Neuro/Psych    GI/Hepatic   Endo/Other  diabetes  Renal/GU      Musculoskeletal   Abdominal   Peds  Hematology   Anesthesia Other Findings   Reproductive/Obstetrics                             Anesthesia Physical Anesthesia Plan  ASA:   Anesthesia Plan:    Post-op Pain Management:    Induction:   PONV Risk Score and Plan:   Airway Management Planned:   Additional Equipment:   Intra-op Plan:   Post-operative Plan:   Informed Consent:   Plan Discussed with:   Anesthesia Plan Comments: (PAT note by Karoline Caldwell, PA-C: Follows with cardiology for history of mild-mod CAD with angina, HTN, HLD, orthostatic hypotension.  Last seen by Dr. Bettina Gavia 02/27/2020 stable from a cardiac standpoint.  Cardiac clearance per telephone encounter 02/21/2020, "Chart reviewed as part of pre-operative protocol coverage. The patient was doing well when seen by Dr. Loney Hering 02/27/20. Given past medical history and time since last visit, based on ACC/AHA guidelines, CARENA STREAM would be at acceptable risk for the planned procedure without further cardiovascular testing. The patient was advised that if she develops new symptoms prior to surgery to contact our office to arrange for a follow-up visit, and she verbalized understanding."  History of nonalcoholic steatohepatitis.  Preop LFTs WNL.  DM2 well-controlled, A1c 6.1.  Remainder of preop labs unremarkable.  EKG 01/11/2020: Normal sinus rhythm.  Rate 79.  Incomplete right bundle block.  Carotid duplex 01/30/2020: Summary:  Right Carotid: There is no evidence of stenosis in the right ICA.  Left Carotid: Velocities in the left ICA are consistent with a 40-59% stenosis.  Vertebrals: Bilateral vertebral arteries demonstrate antegrade flow.   Subclavians: Normal flow hemodynamics were seen in bilateral subclavian        arteries.   ZIO monitor was performed for 4 days beginning 01/11/2020 in order to assess palpitation. The cardiac rhythm throughout was sinus with minimum average and maximum heart rates of 60, 78 and 126 bpm. There were no pauses of 3 seconds or greater and no episodes of second or third-degree AV nodal block or sinus node exit block. There were 6 triggered events 1 was associated with rare APC 2 were associated with rare PVCs. Ventricular ectopy was rare with isolated PVCs Supraventricular ectopy is rare without episodes of atrial fibrillation or flutter.  Conclusion unremarkable event monitor however 3 the triggered events were associated with isolated PVCs or APCs.  LHC 04/27/2018: SUMMARY Mild-Moderate diffuse CAD consistent with diabetic coronary arteries. Most significant lesion being a 70% eccentric lesion in the relatively small caliber codominant RCA (recommend medical management, not positive by CT FFR) Suspect abnormal coronary CTA FFR result was related to diffuse mild disease as opposed to any specific significant lesion. Normal LVEDP with hyperdynamic left ventricle.  RECOMMENDATION Optimize medical management -consider adding long-acting nitrate versus Ranexa. Aggressive cardiovascular risk modification. Recommend Aspirin 30m daily for moderate CAD.  )        Anesthesia Quick Evaluation

## 2020-03-22 NOTE — Progress Notes (Signed)
Anesthesia Chart Review:  Follows with cardiology for history of mild-mod CAD with angina, HTN, HLD, orthostatic hypotension.  Last seen by Dr. Bettina Gavia 02/27/2020 stable from a cardiac standpoint.  Cardiac clearance per telephone encounter 02/21/2020, "Chart reviewed as part of pre-operative protocol coverage. The patient was doing well when seen by Dr. Loney Hering 02/27/20. Given past medical history and time since last visit, based on ACC/AHA guidelines, Elaine Roach would be at acceptable risk for the planned procedure without further cardiovascular testing. The patient was advised that if she develops new symptoms prior to surgery to contact our office to arrange for a follow-up visit, and she verbalized understanding."  History of nonalcoholic steatohepatitis.  Preop LFTs WNL.  DM2 well-controlled, A1c 6.1.  Remainder of preop labs unremarkable.  EKG 01/11/2020: Normal sinus rhythm.  Rate 79.  Incomplete right bundle block.  Carotid duplex 01/30/2020: Summary:  Right Carotid: There is no evidence of stenosis in the right ICA.  Left Carotid: Velocities in the left ICA are consistent with a 40-59% stenosis.  Vertebrals: Bilateral vertebral arteries demonstrate antegrade flow.  Subclavians: Normal flow hemodynamics were seen in bilateral subclavian        arteries.   ZIO monitor was performed for 4 days beginning 01/11/2020 in order to assess palpitation. The cardiac rhythm throughout was sinus with minimum average and maximum heart rates of 60, 78 and 126 bpm. There were no pauses of 3 seconds or greater and no episodes of second or third-degree AV nodal block or sinus node exit block. There were 6 triggered events 1 was associated with rare APC 2 were associated with rare PVCs. Ventricular ectopy was rare with isolated PVCs Supraventricular ectopy is rare without episodes of atrial fibrillation or flutter.  Conclusion unremarkable event monitor however 3 the triggered events were  associated with isolated PVCs or APCs.  LHC 04/27/2018: SUMMARY  Mild-Moderate diffuse CAD consistent with diabetic coronary arteries.  Most significant lesion being a 70% eccentric lesion in the relatively small caliber codominant RCA (recommend medical management, not positive by CT FFR)  Suspect abnormal coronary CTA FFR result was related to diffuse mild disease as opposed to any specific significant lesion.  Normal LVEDP with hyperdynamic left ventricle.  RECOMMENDATION  Optimize medical management -consider adding long-acting nitrate versus Ranexa.  Aggressive cardiovascular risk modification.  Recommend Aspirin 43m daily for moderate CAD.    JWynonia MustyMMental Health InstituteShort Stay Center/Anesthesiology Phone ([phone]/01/2020 9:06 AM

## 2020-03-25 ENCOUNTER — Inpatient Hospital Stay (HOSPITAL_COMMUNITY)
Admission: RE | Disposition: A | Discharge status: Home Health | Payer: Self-pay | Source: Home / Self Care | Attending: Neurosurgery

## 2020-03-25 ENCOUNTER — Inpatient Hospital Stay (HOSPITAL_COMMUNITY): Payer: PPO | Admitting: Anesthesiology

## 2020-03-25 ENCOUNTER — Inpatient Hospital Stay (HOSPITAL_COMMUNITY)
Admission: RE | Admit: 2020-03-25 | Discharge: 2020-03-26 | DRG: 455 | Disposition: A | Discharge status: Home Health | Payer: PPO | Attending: Neurosurgery | Admitting: Neurosurgery

## 2020-03-25 ENCOUNTER — Other Ambulatory Visit: Payer: Self-pay

## 2020-03-25 ENCOUNTER — Encounter (HOSPITAL_COMMUNITY): Payer: Self-pay | Admitting: Neurosurgery

## 2020-03-25 ENCOUNTER — Inpatient Hospital Stay (HOSPITAL_COMMUNITY): Payer: PPO

## 2020-03-25 ENCOUNTER — Inpatient Hospital Stay (HOSPITAL_COMMUNITY): Payer: PPO | Admitting: Physician Assistant

## 2020-03-25 DIAGNOSIS — I10 Essential (primary) hypertension: Secondary | ICD-10-CM | POA: Diagnosis present

## 2020-03-25 DIAGNOSIS — G4700 Insomnia, unspecified: Secondary | ICD-10-CM | POA: Diagnosis present

## 2020-03-25 DIAGNOSIS — M5416 Radiculopathy, lumbar region: Secondary | ICD-10-CM | POA: Diagnosis not present

## 2020-03-25 DIAGNOSIS — M5116 Intervertebral disc disorders with radiculopathy, lumbar region: Secondary | ICD-10-CM | POA: Diagnosis present

## 2020-03-25 DIAGNOSIS — Z419 Encounter for procedure for purposes other than remedying health state, unspecified: Secondary | ICD-10-CM

## 2020-03-25 DIAGNOSIS — M960 Pseudarthrosis after fusion or arthrodesis: Principal | ICD-10-CM | POA: Diagnosis present

## 2020-03-25 DIAGNOSIS — Z7984 Long term (current) use of oral hypoglycemic drugs: Secondary | ICD-10-CM

## 2020-03-25 DIAGNOSIS — E782 Mixed hyperlipidemia: Secondary | ICD-10-CM | POA: Diagnosis not present

## 2020-03-25 DIAGNOSIS — F429 Obsessive-compulsive disorder, unspecified: Secondary | ICD-10-CM | POA: Diagnosis not present

## 2020-03-25 DIAGNOSIS — M5115 Intervertebral disc disorders with radiculopathy, thoracolumbar region: Secondary | ICD-10-CM | POA: Diagnosis not present

## 2020-03-25 DIAGNOSIS — Z9049 Acquired absence of other specified parts of digestive tract: Secondary | ICD-10-CM | POA: Diagnosis not present

## 2020-03-25 DIAGNOSIS — Z79899 Other long term (current) drug therapy: Secondary | ICD-10-CM

## 2020-03-25 DIAGNOSIS — Y838 Other surgical procedures as the cause of abnormal reaction of the patient, or of later complication, without mention of misadventure at the time of the procedure: Secondary | ICD-10-CM | POA: Diagnosis present

## 2020-03-25 DIAGNOSIS — Z9109 Other allergy status, other than to drugs and biological substances: Secondary | ICD-10-CM

## 2020-03-25 DIAGNOSIS — K7581 Nonalcoholic steatohepatitis (NASH): Secondary | ICD-10-CM | POA: Diagnosis present

## 2020-03-25 DIAGNOSIS — K219 Gastro-esophageal reflux disease without esophagitis: Secondary | ICD-10-CM | POA: Diagnosis not present

## 2020-03-25 DIAGNOSIS — Z7982 Long term (current) use of aspirin: Secondary | ICD-10-CM | POA: Diagnosis not present

## 2020-03-25 DIAGNOSIS — E119 Type 2 diabetes mellitus without complications: Secondary | ICD-10-CM | POA: Diagnosis not present

## 2020-03-25 DIAGNOSIS — M5135 Other intervertebral disc degeneration, thoracolumbar region: Secondary | ICD-10-CM | POA: Diagnosis not present

## 2020-03-25 DIAGNOSIS — Z981 Arthrodesis status: Secondary | ICD-10-CM | POA: Diagnosis not present

## 2020-03-25 DIAGNOSIS — Z87891 Personal history of nicotine dependence: Secondary | ICD-10-CM | POA: Diagnosis not present

## 2020-03-25 DIAGNOSIS — Z888 Allergy status to other drugs, medicaments and biological substances status: Secondary | ICD-10-CM | POA: Diagnosis not present

## 2020-03-25 DIAGNOSIS — S32009K Unspecified fracture of unspecified lumbar vertebra, subsequent encounter for fracture with nonunion: Secondary | ICD-10-CM | POA: Diagnosis present

## 2020-03-25 DIAGNOSIS — Z885 Allergy status to narcotic agent status: Secondary | ICD-10-CM

## 2020-03-25 DIAGNOSIS — E785 Hyperlipidemia, unspecified: Secondary | ICD-10-CM | POA: Diagnosis not present

## 2020-03-25 DIAGNOSIS — I2510 Atherosclerotic heart disease of native coronary artery without angina pectoris: Secondary | ICD-10-CM | POA: Diagnosis not present

## 2020-03-25 DIAGNOSIS — M48062 Spinal stenosis, lumbar region with neurogenic claudication: Secondary | ICD-10-CM | POA: Diagnosis not present

## 2020-03-25 DIAGNOSIS — M4325 Fusion of spine, thoracolumbar region: Secondary | ICD-10-CM | POA: Diagnosis not present

## 2020-03-25 LAB — GLUCOSE, CAPILLARY
Glucose-Capillary: 137 mg/dL — ABNORMAL HIGH (ref 70–99)
Glucose-Capillary: 113 mg/dL — ABNORMAL HIGH (ref 70–99)
Glucose-Capillary: 140 mg/dL — ABNORMAL HIGH (ref 70–99)
Glucose-Capillary: 257 mg/dL — ABNORMAL HIGH (ref 70–99)

## 2020-03-25 SURGERY — POSTERIOR LUMBAR FUSION 2 WITH HARDWARE REMOVAL
Anesthesia: General | Site: Spine Thoracic

## 2020-03-25 MED ORDER — NITROGLYCERIN 0.4 MG SL SUBL: 0.4000 mg | SUBLINGUAL_TABLET | SUBLINGUAL | Status: DC | PRN

## 2020-03-25 MED ORDER — INSULIN ASPART 100 UNIT/ML ~~LOC~~ SOLN
0.0000 [IU] | Freq: Three times a day (TID) | SUBCUTANEOUS | Status: DC
  Administered 2020-03-26: 3 [IU] via SUBCUTANEOUS

## 2020-03-25 MED ORDER — ALBUMIN HUMAN 5 % IV SOLN: INTRAVENOUS | Status: DC | PRN

## 2020-03-25 MED ORDER — SODIUM CHLORIDE 0.9% FLUSH: 3.0000 mL | INTRAVENOUS | Status: DC | PRN

## 2020-03-25 MED ORDER — LOSARTAN POTASSIUM 50 MG PO TABS: 50.0000 mg | ORAL_TABLET | Freq: Every day | ORAL | Status: DC | PRN

## 2020-03-25 MED ORDER — LACTATED RINGERS IV SOLN: INTRAVENOUS | Status: DC | PRN

## 2020-03-25 MED ORDER — ISOSORBIDE MONONITRATE ER 60 MG PO TB24
60.0000 mg | ORAL_TABLET | Freq: Every day | ORAL | Status: DC
  Administered 2020-03-26: 60 mg via ORAL
  Filled 2020-03-25: qty 1

## 2020-03-25 MED ORDER — FENTANYL CITRATE (PF) 100 MCG/2ML IJ SOLN
25.0000 ug | INTRAMUSCULAR | Status: DC | PRN
  Administered 2020-03-25: 25 ug via INTRAVENOUS

## 2020-03-25 MED ORDER — CEFAZOLIN SODIUM 1 G IJ SOLR
INTRAMUSCULAR | Status: AC
  Filled 2020-03-25: qty 20

## 2020-03-25 MED ORDER — FENTANYL CITRATE (PF) 100 MCG/2ML IJ SOLN
INTRAMUSCULAR | Status: DC | PRN
  Administered 2020-03-25 (×8): 50 ug via INTRAVENOUS

## 2020-03-25 MED ORDER — FENTANYL CITRATE (PF) 250 MCG/5ML IJ SOLN
INTRAMUSCULAR | Status: AC
  Filled 2020-03-25: qty 5

## 2020-03-25 MED ORDER — SODIUM CHLORIDE 0.9% FLUSH
3.0000 mL | Freq: Two times a day (BID) | INTRAVENOUS | Status: DC
  Administered 2020-03-25: 3 mL via INTRAVENOUS

## 2020-03-25 MED ORDER — THROMBIN 5000 UNITS EX SOLR
CUTANEOUS | Status: AC
  Filled 2020-03-25: qty 5000

## 2020-03-25 MED ORDER — PROMETHAZINE HCL 25 MG/ML IJ SOLN: 6.2500 mg | INTRAMUSCULAR | Status: DC | PRN

## 2020-03-25 MED ORDER — PROPOFOL 500 MG/50ML IV EMUL
INTRAVENOUS | Status: DC | PRN
  Administered 2020-03-25: 20 ug/kg/min via INTRAVENOUS

## 2020-03-25 MED ORDER — KETAMINE HCL 50 MG/5ML IJ SOSY
PREFILLED_SYRINGE | INTRAMUSCULAR | Status: AC
  Filled 2020-03-25: qty 5

## 2020-03-25 MED ORDER — BUPIVACAINE-EPINEPHRINE (PF) 0.5% -1:200000 IJ SOLN
INTRAMUSCULAR | Status: DC | PRN
  Administered 2020-03-25: 10 mL via PERINEURAL

## 2020-03-25 MED ORDER — CELECOXIB 200 MG PO CAPS
200.0000 mg | ORAL_CAPSULE | Freq: Once | ORAL | Status: AC
  Administered 2020-03-25: 200 mg via ORAL
  Filled 2020-03-25: qty 1

## 2020-03-25 MED ORDER — METFORMIN HCL 500 MG PO TABS
500.0000 mg | ORAL_TABLET | Freq: Two times a day (BID) | ORAL | Status: DC
  Administered 2020-03-26: 500 mg via ORAL
  Filled 2020-03-25: qty 1

## 2020-03-25 MED ORDER — ROCURONIUM BROMIDE 10 MG/ML (PF) SYRINGE
PREFILLED_SYRINGE | INTRAVENOUS | Status: DC | PRN
  Administered 2020-03-25: 100 mg via INTRAVENOUS

## 2020-03-25 MED ORDER — BUPIVACAINE-EPINEPHRINE 0.5% -1:200000 IJ SOLN
INTRAMUSCULAR | Status: AC
  Filled 2020-03-25: qty 1

## 2020-03-25 MED ORDER — LIDOCAINE 2% (20 MG/ML) 5 ML SYRINGE
INTRAMUSCULAR | Status: DC | PRN
  Administered 2020-03-25: 100 mg via INTRAVENOUS

## 2020-03-25 MED ORDER — CHLORHEXIDINE GLUCONATE CLOTH 2 % EX PADS: 6.0000 | MEDICATED_PAD | Freq: Once | CUTANEOUS | Status: DC

## 2020-03-25 MED ORDER — DOCUSATE SODIUM 100 MG PO CAPS
100.0000 mg | ORAL_CAPSULE | Freq: Two times a day (BID) | ORAL | Status: DC
  Administered 2020-03-25 – 2020-03-26 (×2): 100 mg via ORAL
  Filled 2020-03-25 (×2): qty 1

## 2020-03-25 MED ORDER — RANOLAZINE ER 500 MG PO TB12
500.0000 mg | ORAL_TABLET | Freq: Two times a day (BID) | ORAL | Status: DC
  Administered 2020-03-25 – 2020-03-26 (×2): 500 mg via ORAL
  Filled 2020-03-25 (×3): qty 1

## 2020-03-25 MED ORDER — ACETAMINOPHEN 325 MG PO TABS: 650.0000 mg | ORAL_TABLET | ORAL | Status: DC | PRN

## 2020-03-25 MED ORDER — 0.9 % SODIUM CHLORIDE (POUR BTL) OPTIME
TOPICAL | Status: DC | PRN
  Administered 2020-03-25: 1000 mL

## 2020-03-25 MED ORDER — CHLORHEXIDINE GLUCONATE 0.12 % MT SOLN
15.0000 mL | Freq: Once | OROMUCOSAL | Status: AC
  Administered 2020-03-25: 15 mL via OROMUCOSAL
  Filled 2020-03-25: qty 15

## 2020-03-25 MED ORDER — HYDROMORPHONE HCL 2 MG PO TABS
2.0000 mg | ORAL_TABLET | ORAL | Status: DC | PRN
  Administered 2020-03-25 – 2020-03-26 (×4): 4 mg via ORAL
  Filled 2020-03-25 (×4): qty 2

## 2020-03-25 MED ORDER — BUPIVACAINE LIPOSOME 1.3 % IJ SUSP
20.0000 mL | Freq: Once | INTRAMUSCULAR | Status: AC
  Administered 2020-03-25: 20 mL
  Filled 2020-03-25: qty 20

## 2020-03-25 MED ORDER — CYCLOBENZAPRINE HCL 10 MG PO TABS
10.0000 mg | ORAL_TABLET | Freq: Three times a day (TID) | ORAL | Status: DC | PRN
  Administered 2020-03-25 – 2020-03-26 (×2): 10 mg via ORAL
  Filled 2020-03-25 (×2): qty 1

## 2020-03-25 MED ORDER — ROCURONIUM BROMIDE 10 MG/ML (PF) SYRINGE
PREFILLED_SYRINGE | INTRAVENOUS | Status: AC
  Filled 2020-03-25: qty 10

## 2020-03-25 MED ORDER — ACETAMINOPHEN 650 MG RE SUPP: 650.0000 mg | RECTAL | Status: DC | PRN

## 2020-03-25 MED ORDER — VITAMIN D 25 MCG (1000 UNIT) PO TABS
5000.0000 [IU] | ORAL_TABLET | Freq: Every day | ORAL | Status: DC
  Administered 2020-03-26: 5000 [IU] via ORAL
  Filled 2020-03-25: qty 5

## 2020-03-25 MED ORDER — BACITRACIN ZINC 500 UNIT/GM EX OINT
TOPICAL_OINTMENT | CUTANEOUS | Status: AC
  Filled 2020-03-25: qty 28.35

## 2020-03-25 MED ORDER — THROMBIN 5000 UNITS EX SOLR
OROMUCOSAL | Status: DC | PRN
  Administered 2020-03-25 (×2): 5 mL via TOPICAL

## 2020-03-25 MED ORDER — SERTRALINE HCL 50 MG PO TABS
100.0000 mg | ORAL_TABLET | Freq: Every day | ORAL | Status: DC
  Administered 2020-03-25: 100 mg via ORAL
  Filled 2020-03-25: qty 2

## 2020-03-25 MED ORDER — ATORVASTATIN CALCIUM 80 MG PO TABS
80.0000 mg | ORAL_TABLET | Freq: Every evening | ORAL | Status: DC
  Administered 2020-03-25: 80 mg via ORAL
  Filled 2020-03-25: qty 1

## 2020-03-25 MED ORDER — ONDANSETRON HCL 4 MG/2ML IJ SOLN: 4.0000 mg | Freq: Four times a day (QID) | INTRAMUSCULAR | Status: DC | PRN

## 2020-03-25 MED ORDER — PHENYLEPHRINE HCL-NACL 10-0.9 MG/250ML-% IV SOLN
INTRAVENOUS | Status: DC | PRN
  Administered 2020-03-25: 25 ug/min via INTRAVENOUS
  Administered 2020-03-25: 10 ug/min via INTRAVENOUS

## 2020-03-25 MED ORDER — INSULIN ASPART 100 UNIT/ML ~~LOC~~ SOLN: 0.0000 [IU] | SUBCUTANEOUS | Status: DC

## 2020-03-25 MED ORDER — ZOLPIDEM TARTRATE 5 MG PO TABS: 5.0000 mg | ORAL_TABLET | Freq: Every evening | ORAL | Status: DC | PRN

## 2020-03-25 MED ORDER — EZETIMIBE 10 MG PO TABS
10.0000 mg | ORAL_TABLET | Freq: Every evening | ORAL | Status: DC
  Administered 2020-03-25: 10 mg via ORAL
  Filled 2020-03-25 (×2): qty 1

## 2020-03-25 MED ORDER — PHENOL 1.4 % MT LIQD: 1.0000 | OROMUCOSAL | Status: DC | PRN

## 2020-03-25 MED ORDER — FENTANYL CITRATE (PF) 100 MCG/2ML IJ SOLN
INTRAMUSCULAR | Status: AC
  Filled 2020-03-25: qty 2

## 2020-03-25 MED ORDER — ACETAMINOPHEN 500 MG PO TABS
1000.0000 mg | ORAL_TABLET | Freq: Once | ORAL | Status: AC
  Administered 2020-03-25: 1000 mg via ORAL
  Filled 2020-03-25: qty 2

## 2020-03-25 MED ORDER — HYPROMELLOSE (GONIOSCOPIC) 2.5 % OP SOLN
1.0000 [drp] | Freq: Two times a day (BID) | OPHTHALMIC | Status: DC | PRN
  Filled 2020-03-25: qty 15

## 2020-03-25 MED ORDER — KETAMINE HCL 10 MG/ML IJ SOLN
INTRAMUSCULAR | Status: DC | PRN
  Administered 2020-03-25: 40 mg via INTRAVENOUS
  Administered 2020-03-25: 10 mg via INTRAVENOUS

## 2020-03-25 MED ORDER — SODIUM CHLORIDE 0.9 % IV SOLN: 250.0000 mL | INTRAVENOUS | Status: DC

## 2020-03-25 MED ORDER — MIDAZOLAM HCL 2 MG/2ML IJ SOLN
INTRAMUSCULAR | Status: AC
  Filled 2020-03-25: qty 2

## 2020-03-25 MED ORDER — BISACODYL 10 MG RE SUPP: 10.0000 mg | Freq: Every day | RECTAL | Status: DC | PRN

## 2020-03-25 MED ORDER — ONDANSETRON HCL 4 MG/2ML IJ SOLN
INTRAMUSCULAR | Status: AC
  Filled 2020-03-25: qty 2

## 2020-03-25 MED ORDER — DEXAMETHASONE SODIUM PHOSPHATE 10 MG/ML IJ SOLN
INTRAMUSCULAR | Status: AC
  Filled 2020-03-25: qty 1

## 2020-03-25 MED ORDER — CEFAZOLIN SODIUM-DEXTROSE 2-4 GM/100ML-% IV SOLN
2.0000 g | Freq: Three times a day (TID) | INTRAVENOUS | Status: DC
  Administered 2020-03-26: 2 g via INTRAVENOUS
  Filled 2020-03-25: qty 100

## 2020-03-25 MED ORDER — CEFAZOLIN SODIUM-DEXTROSE 2-4 GM/100ML-% IV SOLN
2.0000 g | INTRAVENOUS | Status: AC
  Administered 2020-03-25 (×2): 2 g via INTRAVENOUS
  Filled 2020-03-25: qty 100

## 2020-03-25 MED ORDER — PROPOFOL 10 MG/ML IV BOLUS
INTRAVENOUS | Status: AC
  Filled 2020-03-25: qty 20

## 2020-03-25 MED ORDER — LIDOCAINE 2% (20 MG/ML) 5 ML SYRINGE
INTRAMUSCULAR | Status: AC
  Filled 2020-03-25: qty 5

## 2020-03-25 MED ORDER — PROPOFOL 1000 MG/100ML IV EMUL
INTRAVENOUS | Status: AC
  Filled 2020-03-25: qty 100

## 2020-03-25 MED ORDER — DILTIAZEM HCL ER COATED BEADS 120 MG PO CP24
240.0000 mg | ORAL_CAPSULE | Freq: Every day | ORAL | Status: DC
  Administered 2020-03-26: 240 mg via ORAL
  Filled 2020-03-25: qty 2

## 2020-03-25 MED ORDER — ONDANSETRON HCL 4 MG PO TABS: 4.0000 mg | ORAL_TABLET | Freq: Four times a day (QID) | ORAL | Status: DC | PRN

## 2020-03-25 MED ORDER — ORAL CARE MOUTH RINSE: 15.0000 mL | Freq: Once | OROMUCOSAL | Status: AC

## 2020-03-25 MED ORDER — LORATADINE 10 MG PO TABS
10.0000 mg | ORAL_TABLET | Freq: Every day | ORAL | Status: DC
  Administered 2020-03-26: 10 mg via ORAL
  Filled 2020-03-25: qty 1

## 2020-03-25 MED ORDER — THROMBIN 20000 UNITS EX SOLR
CUTANEOUS | Status: DC | PRN
  Administered 2020-03-25: 20 mL via TOPICAL

## 2020-03-25 MED ORDER — SCOPOLAMINE 1 MG/3DAYS TD PT72
1.0000 | MEDICATED_PATCH | TRANSDERMAL | Status: DC
  Administered 2020-03-25: 1.5 mg via TRANSDERMAL
  Filled 2020-03-25: qty 1

## 2020-03-25 MED ORDER — SUGAMMADEX SODIUM 200 MG/2ML IV SOLN
INTRAVENOUS | Status: DC | PRN
  Administered 2020-03-25: 200 mg via INTRAVENOUS

## 2020-03-25 MED ORDER — MORPHINE SULFATE (PF) 4 MG/ML IV SOLN: 4.0000 mg | INTRAVENOUS | Status: DC | PRN

## 2020-03-25 MED ORDER — MIDAZOLAM HCL 5 MG/5ML IJ SOLN
INTRAMUSCULAR | Status: DC | PRN
  Administered 2020-03-25: 2 mg via INTRAVENOUS

## 2020-03-25 MED ORDER — LACTATED RINGERS IV SOLN: INTRAVENOUS | Status: DC

## 2020-03-25 MED ORDER — MENTHOL 3 MG MT LOZG: 1.0000 | LOZENGE | OROMUCOSAL | Status: DC | PRN

## 2020-03-25 MED ORDER — THROMBIN 20000 UNITS EX SOLR
CUTANEOUS | Status: AC
  Filled 2020-03-25: qty 20000

## 2020-03-25 MED ORDER — PANTOPRAZOLE SODIUM 40 MG PO TBEC
80.0000 mg | DELAYED_RELEASE_TABLET | Freq: Every day | ORAL | Status: DC
  Administered 2020-03-26: 80 mg via ORAL
  Filled 2020-03-25: qty 2

## 2020-03-25 MED ORDER — ACETAMINOPHEN 500 MG PO TABS
1000.0000 mg | ORAL_TABLET | Freq: Four times a day (QID) | ORAL | Status: DC
  Administered 2020-03-25 – 2020-03-26 (×3): 1000 mg via ORAL
  Filled 2020-03-25 (×3): qty 2

## 2020-03-25 MED ORDER — BACITRACIN ZINC 500 UNIT/GM EX OINT
TOPICAL_OINTMENT | CUTANEOUS | Status: DC | PRN
  Administered 2020-03-25: 1 via TOPICAL

## 2020-03-25 MED ORDER — DEXAMETHASONE SODIUM PHOSPHATE 10 MG/ML IJ SOLN
INTRAMUSCULAR | Status: DC | PRN
  Administered 2020-03-25: 8 mg via INTRAVENOUS

## 2020-03-25 MED ORDER — INSULIN ASPART 100 UNIT/ML ~~LOC~~ SOLN
0.0000 [IU] | Freq: Every day | SUBCUTANEOUS | Status: DC
  Administered 2020-03-25: 3 [IU] via SUBCUTANEOUS

## 2020-03-25 MED ORDER — PROPOFOL 10 MG/ML IV BOLUS
INTRAVENOUS | Status: DC | PRN
  Administered 2020-03-25: 150 mg via INTRAVENOUS

## 2020-03-25 SURGICAL SUPPLY — 67 items
BAG DECANTER FOR FLEXI CONT (MISCELLANEOUS) ×2 IMPLANT
BASKET BONE COLLECTION (BASKET) ×2 IMPLANT
BENZOIN TINCTURE PRP APPL 2/3 (GAUZE/BANDAGES/DRESSINGS) ×2 IMPLANT
BLADE CLIPPER SURG (BLADE) IMPLANT
BUR MATCHSTICK NEURO 3.0 LAGG (BURR) ×2 IMPLANT
BUR PRECISION FLUTE 6.0 (BURR) ×2 IMPLANT
BUR RND OSTEON ELITE 6.0 (BURR) IMPLANT
CAGE ALTERA 8X12-8 (Cage) ×2 IMPLANT
CANISTER SUCT 3000ML PPV (MISCELLANEOUS) ×2 IMPLANT
CAP LOCK DLX THRD (Cap) ×28 IMPLANT
CARTRIDGE OIL MAESTRO DRILL (MISCELLANEOUS) ×1 IMPLANT
CLAMP L REVERE OFFSET CONN (Clamp) ×2 IMPLANT
CLAMP R REVERE OFFSET CONN (Clamp) ×2 IMPLANT
CNTNR URN SCR LID CUP LEK RST (MISCELLANEOUS) ×1 IMPLANT
CONNECTOR CROSS 6.0-6.35X48-60 (Connector) ×2 IMPLANT
CONT SPEC 4OZ STRL OR WHT (MISCELLANEOUS) ×1
COVER BACK TABLE 60X90IN (DRAPES) ×2 IMPLANT
COVER WAND RF STERILE (DRAPES) ×2 IMPLANT
DECANTER SPIKE VIAL GLASS SM (MISCELLANEOUS) ×2 IMPLANT
DIFFUSER DRILL AIR PNEUMATIC (MISCELLANEOUS) ×2 IMPLANT
DRAPE C-ARM 42X72 X-RAY (DRAPES) ×4 IMPLANT
DRAPE HALF SHEET 40X57 (DRAPES) ×2 IMPLANT
DRAPE LAPAROTOMY 100X72X124 (DRAPES) ×2 IMPLANT
DRAPE SURG 17X23 STRL (DRAPES) ×8 IMPLANT
DRSG OPSITE POSTOP 4X10 (GAUZE/BANDAGES/DRESSINGS) ×2 IMPLANT
DRSG OPSITE POSTOP 4X6 (GAUZE/BANDAGES/DRESSINGS) ×2 IMPLANT
ELECT BLADE 4.0 EZ CLEAN MEGAD (MISCELLANEOUS) ×2
ELECT REM PT RETURN 9FT ADLT (ELECTROSURGICAL) ×2
ELECTRODE BLDE 4.0 EZ CLN MEGD (MISCELLANEOUS) ×1 IMPLANT
ELECTRODE REM PT RTRN 9FT ADLT (ELECTROSURGICAL) ×1 IMPLANT
GAUZE 4X4 16PLY RFD (DISPOSABLE) ×2 IMPLANT
GLOVE BIO SURGEON STRL SZ8 (GLOVE) ×4 IMPLANT
GLOVE BIO SURGEON STRL SZ8.5 (GLOVE) ×4 IMPLANT
GLOVE EXAM NITRILE XL STR (GLOVE) IMPLANT
GOWN STRL REUS W/ TWL LRG LVL3 (GOWN DISPOSABLE) IMPLANT
GOWN STRL REUS W/ TWL XL LVL3 (GOWN DISPOSABLE) ×2 IMPLANT
GOWN STRL REUS W/TWL 2XL LVL3 (GOWN DISPOSABLE) IMPLANT
GOWN STRL REUS W/TWL LRG LVL3 (GOWN DISPOSABLE)
GOWN STRL REUS W/TWL XL LVL3 (GOWN DISPOSABLE) ×2
GRAFT BONE PROTEIOS LRG 5CC (Orthopedic Implant) ×2 IMPLANT
HEMOSTAT POWDER KIT SURGIFOAM (HEMOSTASIS) ×6 IMPLANT
KIT BASIN OR (CUSTOM PROCEDURE TRAY) ×2 IMPLANT
KIT TURNOVER KIT B (KITS) ×2 IMPLANT
MILL MEDIUM DISP (BLADE) IMPLANT
NEEDLE HYPO 21X1.5 SAFETY (NEEDLE) ×2 IMPLANT
NEEDLE HYPO 22GX1.5 SAFETY (NEEDLE) ×2 IMPLANT
NS IRRIG 1000ML POUR BTL (IV SOLUTION) ×2 IMPLANT
OIL CARTRIDGE MAESTRO DRILL (MISCELLANEOUS) ×2
PACK LAMINECTOMY NEURO (CUSTOM PROCEDURE TRAY) ×2 IMPLANT
PAD ARMBOARD 7.5X6 YLW CONV (MISCELLANEOUS) IMPLANT
PATTIES SURGICAL .5 X1 (DISPOSABLE) IMPLANT
PUTTY DBM 10CC CALC GRAN (Putty) ×2 IMPLANT
SCREW CREO DLX POLY 6.5X40 (Screw) ×12 IMPLANT
SCREW PA CREO DLX 6.5X45 (Screw) ×8 IMPLANT
SCREW PA CREO DLX 6.5X50 (Screw) ×8 IMPLANT
SPONGE LAP 4X18 RFD (DISPOSABLE) IMPLANT
SPONGE NEURO XRAY DETECT 1X3 (DISPOSABLE) IMPLANT
SPONGE SURGIFOAM ABS GEL 100 (HEMOSTASIS) ×2 IMPLANT
STRIP CLOSURE SKIN 1/2X4 (GAUZE/BANDAGES/DRESSINGS) ×2 IMPLANT
SUT VIC AB 1 CT1 18XBRD ANBCTR (SUTURE) ×2 IMPLANT
SUT VIC AB 1 CT1 8-18 (SUTURE) ×2
SUT VIC AB 2-0 CP2 18 (SUTURE) ×4 IMPLANT
SYR 20ML LL LF (SYRINGE) ×2 IMPLANT
TOWEL GREEN STERILE (TOWEL DISPOSABLE) ×2 IMPLANT
TOWEL GREEN STERILE FF (TOWEL DISPOSABLE) ×2 IMPLANT
TRAY FOLEY MTR SLVR 16FR STAT (SET/KITS/TRAYS/PACK) ×2 IMPLANT
WATER STERILE IRR 1000ML POUR (IV SOLUTION) ×2 IMPLANT

## 2020-03-25 NOTE — Anesthesia Postprocedure Evaluation (Signed)
Anesthesia Post Note  Patient: Elaine Roach  Procedure(s) Performed: EXPLORATION FUSION, REDO POSTERIOR LUMBAR INTERBODY FUSION LUMBAR THREE-FOUR, POSTERIOR LUMBAR INTERBODY FUSION THORACIC TWELVE-LUMBAR ONE, POSTERIOR SEGMENTAL ARTHRODESIS THORACIC TEN-LUMBAR FOUR (N/A Spine Thoracic)     Patient location during evaluation: PACU Anesthesia Type: General Level of consciousness: awake and alert Pain management: pain level controlled Vital Signs Assessment: post-procedure vital signs reviewed and stable Respiratory status: spontaneous breathing, nonlabored ventilation, respiratory function stable and patient connected to nasal cannula oxygen Cardiovascular status: blood pressure returned to baseline and stable Postop Assessment: no apparent nausea or vomiting Anesthetic complications: no   No complications documented.  Last Vitals:  Vitals:   03/25/20 1850 03/25/20 1905  BP: (!) 144/66 138/76  Pulse: 87 85  Resp: 20 16  Temp:    SpO2: 96% 96%    Last Pain:  Vitals:   03/25/20 1903  TempSrc:   PainSc: 9                  Tiajuana Amass

## 2020-03-25 NOTE — H&P (Signed)
Subjective: The patient is a 69 year old white female whose had several neck and back surgeries/fusions.  She has developed recurrent back and leg pain.  She has failed medical management.  She was worked up with a lumbar myelo CT which demonstrated findings consistent with an L3-4 pseudoarthrosis as well as adjacent segment disease.  I discussed the various treatment options with her.  She has decided proceed with exploration of her fusion and extension to T10.  Past Medical History:  Diagnosis Date  . Arthritis   . Carotid artery calcification   . Complication of anesthesia    pt. woke up during hand surgery and colonoscopy, cataract surgery  . Coronary artery disease   . Diabetes mellitus without complication ([place])    type 2    dx 10 yrs ago  . Dysrhythmia    BENIGN PVC'S  . GERD (gastroesophageal reflux disease)   . Headache   . History of OCD (obsessive compulsive disorder)    TO A DEGREE  . Hypertension   . Insomnia   . Mixed hyperlipidemia   . Nonalcoholic steatohepatitis ([facility])    DX IN 1995  . Osteoarthritis   . Pneumonia    walking pneu. in the past  . PONV (postoperative nausea and vomiting)     Past Surgical History:  Procedure Laterality Date  . ANTERIOR CERVICAL DECOMP/DISCECTOMY FUSION  07/19/2017   Procedure: ANTERIOR CERVICAL DECOMPRESSION/DISCECTOMY FUSION , INTERBODY PROSTHESIS, ANTERIOR PLATE CERVICAL SIX- CERVICAL SEVEN, CERVICAL SEVEN- THORACIC ONE, EXPLORE OLD FUSION;  Surgeon: Newman Pies, MD;  Location: [facility];  Service: Neurosurgery;;  . BACK SURGERY     x3  . BUNIONECTOMY     RIGHT  . CARDIAC CATHETERIZATION    . CARPAL TUNNEL RELEASE     RIGHT  . CHOLECYSTECTOMY    . GANGLION CYST EXCISION Right   . LEFT HEART CATH AND CORONARY ANGIOGRAPHY N/A 04/27/2018   Procedure: LEFT HEART CATH AND CORONARY ANGIOGRAPHY;  Surgeon: Leonie Man, MD;  Location: [facility];  Service: Cardiovascular;  Laterality: N/A;  . MYELOGRAM    . SHOULDER  ARTHROSCOPY W/ ROTATOR CUFF REPAIR     4 SCOPES.Marland KitchenMarland KitchenALL ON THE RIGHT  . TUBAL LIGATION    . WOUND EXPLORATION  03/19/2015   CERVICAL WOUND   . WOUND EXPLORATION N/A 03/19/2015   Procedure: CERVICAL WOUND EXPLORATION, EVACUATION OF CERVICAL HEMATOMA;  Surgeon: Newman Pies, MD;  Location: [facility];  Service: Neurosurgery;  Laterality: N/A;  cervical wound exploration, evacuation of cervical hematoma    Allergies  Allergen Reactions  . Codeine Itching and Swelling    Facial swelling  . Lisinopril Hives  . Tape Rash    Surgical tape     Social History   Tobacco Use  . Smoking status: Former Smoker    Packs/day: 2.00    Years: 17.00    Pack years: 34.00    Types: Cigarettes  . Smokeless tobacco: Never Used  . Tobacco comment: quit when she was 35  Substance Use Topics  . Alcohol use: Yes    Comment: OCCASIONAL  WINE    Family History  Problem Relation Age of Onset  . Breast cancer Mother   . Heart Problems Father   . Heart failure Paternal Grandmother   . Heart failure Paternal Grandfather    Prior to Admission medications   Medication Sig Start Date End Date Taking? Authorizing Provider  aspirin 81 MG EC tablet Take 81 mg by mouth every evening.  Yes [provider]  atorvastatin (LIPITOR) 80 MG tablet Take 80 mg by mouth every evening.    Yes [provider]  cetirizine (ZYRTEC) 10 MG tablet Take 10 mg by mouth daily.    Yes [provider]  Cholecalciferol (VITAMIN D3) 5000 units CAPS Take 5,000 Units by mouth daily.   Yes [provider]  diltiazem (CARDIZEM CD) 240 MG 24 hr capsule TAKE 1 CAPSULE BY MOUTH DAILY Patient taking differently: Take 240 mg by mouth daily.  02/21/20  Yes Richardo Priest, MD  ezetimibe (ZETIA) 10 MG tablet Take 10 mg by mouth every evening.  07/04/19  Yes [provider]  HYDROmorphone (DILAUDID) 2 MG tablet Take 2 mg by mouth every 6 (six) hours as needed for pain. 03/06/20  Yes [provider]  hydroxypropyl methylcellulose / hypromellose (ISOPTO TEARS / GONIOVISC) 2.5 % ophthalmic solution Place 1 drop into both eyes 2 (two) times daily as needed for dry eyes.   Yes [provider]  isosorbide mononitrate (IMDUR) 60 MG 24 hr tablet TAKE 1 TABLET(60 MG) BY MOUTH DAILY Patient taking differently: Take 60 mg by mouth daily.  02/21/20  Yes Richardo Priest, MD  losartan (COZAAR) 50 MG tablet Take 1 tablet (50 mg total) by mouth daily as needed. Take if your Blood Pressure is greater than 160 on top. Patient taking differently: Take 50 mg by mouth daily as needed (Blood Pressure is greater than 160 on top).  02/27/20 05/27/20 Yes Richardo Priest, MD  metFORMIN (GLUCOPHAGE) 500 MG tablet Take 500 mg by mouth 2 (two) times daily with a meal.   Yes [provider]  nitroGLYCERIN (NITROSTAT) 0.4 MG SL tablet Place 1 tablet (0.4 mg total) under the tongue every 5 (five) minutes as needed for chest pain. 02/27/20 05/27/20 Yes Richardo Priest, MD  omeprazole (PRILOSEC) 40 MG capsule Take 40 mg by mouth every morning.    Yes [provider]  ranolazine (RANEXA) 500 MG 12 hr tablet TAKE 1 TABLET(500 MG) BY MOUTH TWICE DAILY Patient taking differently: Take 500 mg by mouth 2 (two) times daily.  02/26/20  Yes Richardo Priest, MD  sertraline (ZOLOFT) 100 MG tablet Take 100 mg by mouth at bedtime.   Yes [provider]  zolpidem (AMBIEN) 10 MG tablet Take 10 mg by mouth daily.  03/02/18  Yes [provider]  glucose blood (PRECISION QID TEST) test strip 1 strip by Misc.(Non-Drug; Combo Route) route daily. 07/06/17   [provider]  ibuprofen (ADVIL,MOTRIN) 200 MG tablet Take 800 mg by mouth daily as needed for moderate pain. Patient not taking: Reported on 03/12/2020    [provider]     Review of Systems  Positive ROS: As above  All other systems have been reviewed and were otherwise negative with the exception of those  mentioned in the HPI and as above.  Objective: Vital signs in last 24 hours: Temp:  [98.7 F (37.1 C)] 98.7 F (37.1 C) (10/11 0931) Pulse Rate:  [79] 79 (10/11 0931) Resp:  [18] 18 (10/11 0931) BP: (166)/(65) 166/65 (10/11 0931) SpO2:  [98 %] 98 % (10/11 0931) Weight:  [79.5 kg] 79.5 kg (10/11 0950) Estimated body mass index is 30.07 kg/m as calculated from the following:   Height as of this encounter: 5' 4"  (1.626 m).   Weight as of this encounter: 79.5 kg.   General Appearance: Alert Head: Normocephalic, without obvious abnormality, atraumatic Eyes: PERRL, conjunctiva/corneas clear,  EOM's intact,    Ears: Normal  Throat: Normal  Neck: Cervical incision is well-healed. Back: Her lumbar incision is well-healed.   Lungs: Clear to auscultation bilaterally, respirations unlabored Heart: Regular rate and rhythm, no murmur, rub or gallop Abdomen: Soft, non-tender Extremities: Extremities normal, atraumatic, no cyanosis or edema Skin: unremarkable  NEUROLOGIC:   Mental status: alert and oriented,Motor Exam - grossly normal Sensory Exam - grossly normal Reflexes:  Coordination - grossly normal Gait - grossly normal Balance - grossly normal Cranial Nerves: I: smell Not tested  II: visual acuity  OS: Normal  OD: Normal   II: visual fields Full to confrontation  II: pupils Equal, round, reactive to light  III,VII: ptosis None  III,IV,VI: extraocular muscles  Full ROM  V: mastication Normal  V: facial light touch sensation  Normal  V,VII: corneal reflex  Present  VII: facial muscle function - upper  Normal  VII: facial muscle function - lower Normal  VIII: hearing Not tested  IX: soft palate elevation  Normal  IX,X: gag reflex Present  XI: trapezius strength  5/5  XI: sternocleidomastoid strength 5/5  XI: neck flexion strength  5/5  XII: tongue strength  Normal    Data Review Lab Results  Component Value Date   WBC 5.9 03/21/2020   HGB 10.8 (L) 03/21/2020   HCT  35.6 (L) 03/21/2020   MCV 85.6 03/21/2020   PLT 285 03/21/2020   Lab Results  Component Value Date   NA 141 03/21/2020   K 3.5 03/21/2020   CL 104 03/21/2020   CO2 25 03/21/2020   BUN 15 03/21/2020   CREATININE 0.85 03/21/2020   GLUCOSE 120 (H) 03/21/2020   No results found for: INR, PROTIME  Assessment/Plan: L3-4 pseudoarthrosis, lumbago, lumbar radiculopathy, lumbar spinal stenosis: I have discussed the situation with the patient.  I have reviewed her imaging studies with her and pointed out the abnormalities.  We have discussed the various treatment options including surgery.  I have described the surgical treatment option of an exploration of her cervical fusion with a redo L3-4 fusion and extension of her posterior lateral arthrodesis instrumentation to T10.  I have shown her surgical models.  I have given her a surgical pamphlet.  We have discussed the risk, benefits, alternatives, expected postoperative course, and likelihood of achieving our goals with surgery.  I have answered all her questions.  She has decided proceed with surgery.   [name] 03/25/2020 12:43 PM

## 2020-03-25 NOTE — Progress Notes (Signed)
Orthopedic Tech Progress Note Patient Details:  KAMIKA GOODLOE Apr 11, 1951 [id number] Ordered outside vendor brace for pt, brace will be applied tomorrow morning, 03/26/2020. Patient ID: [name], female   DOB: 08/22/1950, 69 y.o.   MRN: [id number]   Tammy Sours 03/25/2020, 8:48 PM

## 2020-03-25 NOTE — Anesthesia Procedure Notes (Signed)
Procedure Name: Intubation Date/Time: 03/25/2020 12:56 PM Performed by: Lowella Dell, CRNA Pre-anesthesia Checklist: Patient identified, Emergency Drugs available, Suction available and Patient being monitored Patient Re-evaluated:Patient Re-evaluated prior to induction Oxygen Delivery Method: Circle System Utilized Preoxygenation: Pre-oxygenation with 100% oxygen Induction Type: IV induction Ventilation: Mask ventilation without difficulty Laryngoscope Size: Mac and 3 Grade View: Grade I Tube type: Oral Tube size: 7.0 mm Number of attempts: 1 Airway Equipment and Method: Stylet Placement Confirmation: ETT inserted through vocal cords under direct vision,  positive ETCO2 and breath sounds checked- equal and bilateral Secured at: 21 cm Tube secured with: Tape Dental Injury: Teeth and Oropharynx as per pre-operative assessment

## 2020-03-25 NOTE — Op Note (Signed)
Brief history: The patient is a 69 year old white female who has had several prior spine fusions.  She has developed recurrent back pain.  She has failed medical management.  She would work-up with a thoracic and lumbar myelo CT which demonstrated findings consistent with a pseudoarthrosis at L3-4 as well as adjacent segment disease at T12-L1.  I discussed the various treatment options with her.  She has decided to proceed with surgery after weighing the risks, benefits and alternatives.  Preoperative diagnosis: Thoracolumbar degenerative disc disease, lumbar pseudoarthrosis; lumbago; lumbar radiculopathy;   Postoperative diagnosis: The same  Procedure: Exploration of lumbar fusion/removal of lumbar hardware; T12-L1 laminotomy/foraminotomies/medial facetectomy; T12-L1 transforaminal lumbar interbody fusion with  ProtieOS' local morselized autograft bone and Zimmer DBM; insertion of interbody prosthesis at T12-L1 (globus peek expandable interbody prosthesis); posterior segmental instrumentation from T10 to L4 with globus titanium pedicle screws and rods; posterior lateral arthrodesis at T10-11, T11-12, T12-L1, L1-2, L2-3, and L3-4 with ProtieOS,  local morselized autograft bone and Zimmer DBM.  Surgeon: Dr. Earle Gell  Asst.: Dr. Kary Kos  Anesthesia: Gen. endotracheal  Estimated blood loss: 250 cc  Drains: None  Complications: None  Description of procedure: The patient was brought to the operating room by the anesthesia team. General endotracheal anesthesia was induced. The patient was turned to the prone position on the Wilson frame. The patient's lumbosacral region was then prepared with Betadine scrub and Betadine solution. Sterile drapes were applied.  I then injected the area to be incised with Marcaine with epinephrine solution. I then used the scalpel to make a linear midline incision over the T10-11 to L3-4 interspace, incising through her old surgical scar. I then used  electrocautery to perform a bilateral subperiosteal dissection exposing the spinous process and lamina of T10 down to L4 and to expose the old hardware from L1-L3 bilaterally.  We then inserted the Verstrac retractor to provide exposure.  I explored the fusion by removing the caps and the rods from the old screws.  It turns out the patient's rod was 5.5 mm and we only had 1/4 inch system so I remove the old screws.  I began the decompression by using the high speed drill to perform laminotomies at T12-L1 on the right. We then used the Kerrison punches to widen the laminotomy and removed the ligamentum flavum at T12-L1 on the right. We used the Kerrison punches to remove the facet at T12-L1 on the right.  We now turned our attention to the transforaminal lumbar interbody fusion. I used a scalpel to incise the intervertebral disc at T12-L1 on the right, lateral to the thecal sac. I then performed a partial intervertebral discectomy at T12-L1 on the right using the pituitary forceps. We prepared the vertebral endplates at B44-H6 on the right for the fusion by removing the soft tissues with the curettes. We then used the trial spacers to pick the appropriate sized interbody prosthesis. We prefilled his prosthesis with a combination of local morselized autograft bone that we obtained during the decompression as well as Zimmer DBM. We inserted the prefilled prosthesis into the interspace at T12-L1 on the right, we then turned and expanded the prosthesis. There was a good snug fit of the prosthesis in the interspace. We then filled and the remainder of the intervertebral disc space with ProieOS,local morselized autograft bone and Zimmer DBM. This completed the posterior lumbar interbody arthrodesis.  During the decompression and insertion of the prosthesis the assistant protected the thecal sac and nerve roots with the  D'Errico retractor.  We now turned attention to the instrumentation. Under fluoroscopic guidance  we cannulated the bilateral T10, T11, T12 and L4 pedicles with the bone probe we then removed the bone probe. We then tapped the pedicle with a 5.5 millimeter tap. We then removed the tap. We probed inside the tapped pedicle with a ball probe to rule out cortical breaches.  We used the old screw holes at L1, L2 and L3 bilaterally.  We then inserted a 6.5 x 40, 45 and 50 millimeter pedicle screw into the T10, T11, T12, L1, L2, L3 and L4 pedicles bilaterally under fluoroscopic guidance. We then palpated along the medial aspect of the pedicles to rule out cortical breaches. There were none. The nerve roots were not injured. We then connected the unilateral pedicle screws with a lordotic rod.  We then tightened the caps appropriately.  We placed a cross connector between the rods.  This completed the instrumentation from T10-L4 bilaterally.  We now turned our attention to the posterior lateral arthrodesis at T10-11, T11-12, T12-L1, L1-2, L2-3, and L3-4.  We cleared the soft tissue from the facets using electrocautery.  We used the high-speed drill to decorticate the remainder of the facets, pars, transverse process at T10-11, T11-12, T12-L1, L1-2, L2-3 and L3-4. We then applied a combination of ProtieOS,  local morselized autograft bone and Zimmer DBM over these decorticated posterior lateral structures. This completed the posterior lateral arthrodesis.  We then obtained hemostasis using bipolar electrocautery. We irrigated the wound out with bacitracin solution. We inspected the thecal sac and nerve roots and noted they were well decompressed. We then removed the retractor.  We injected Exparel . We reapproximated patient's thoracolumbar fascia with interrupted #1 Vicryl suture. We reapproximated patient's subcutaneous tissue with interrupted 2-0 Vicryl suture. The reapproximated patient's skin with Steri-Strips and benzoin. The wound was then coated with bacitracin ointment. A sterile dressing was applied. The  drapes were removed. The patient was subsequently returned to the supine position where they were extubated by the anesthesia team. He was then transported to the post anesthesia care unit in stable condition. All sponge instrument and needle counts were reportedly correct at the end of this case.

## 2020-03-25 NOTE — Transfer of Care (Addendum)
Immediate Anesthesia Transfer of Care Note  Patient: [name]  Procedure(s) Performed: EXPLORATION FUSION, REDO POSTERIOR LUMBAR INTERBODY FUSION LUMBAR THREE-FOUR, POSTERIOR LUMBAR INTERBODY FUSION THORACIC TWELVE-LUMBAR ONE, POSTERIOR SEGMENTAL ARTHRODESIS THORACIC TEN-LUMBAR FOUR (N/A Spine Thoracic)  Patient Location: PACU  Anesthesia Type:General  Level of Consciousness: drowsy  Airway & Oxygen Therapy: Patient Spontanous Breathing and Patient connected to face mask oxygen  Post-op Assessment: Report given to RN, Post -op Vital signs reviewed and stable and Patient moving all extremities  Post vital signs: Reviewed and stable  Last Vitals:  Vitals Value Taken Time  BP 140/64 03/25/20 1817  Temp    Pulse 82 03/25/20 1820  Resp 11 03/25/20 1820  SpO2 94 % 03/25/20 1820  Vitals shown include unvalidated device data.  Last Pain:  Vitals:   03/25/20 0950  TempSrc:   PainSc: 7       Patients Stated Pain Goal: 3 (57/33/44 8301)  Complications: No complications documented.

## 2020-03-25 NOTE — Anesthesia Preprocedure Evaluation (Addendum)
Anesthesia Evaluation  Patient identified by MRN, date of birth, ID band Patient awake    Reviewed: Allergy & Precautions, H&P , NPO status , Patient's Chart, lab work & pertinent test results  History of Anesthesia Complications (+) PONV and history of anesthetic complications  Airway Mallampati: II  TM Distance: >3 FB Neck ROM: Full    Dental  (+) Partial Lower, Dental Advisory Given   Pulmonary Current Smoker, former smoker,    Pulmonary exam normal        Cardiovascular hypertension, Pt. on medications Normal cardiovascular exam  history of mild-mod CAD with angina, HTN, HLD, orthostatic hypotension.  Last seen by Dr. Bettina Gavia 02/27/2020 stable from a cardiac standpoint.  Cardiac clearance per telephone encounter 02/21/2020, "Chart reviewed as part of pre-operative protocol coverage. The patient was doing well when seen by Dr. Loney Hering 02/27/20. Given past medical history and time since last visit, based on ACC/AHA guidelines, STARLIT RABURN would be at acceptable risk for the planned procedure without further cardiovascular testing.   LHC 04/27/2018: SUMMARY  Mild-Moderate diffuse CAD consistent with diabetic coronary arteries.  Most significant lesion being a 70% eccentric lesion in the relatively small caliber codominant RCA (recommend medical management, not positive by CT FFR)  Suspect abnormal coronary CTA FFR result was related to diffuse mild disease as opposed to any specific significant lesion.  Normal LVEDP with hyperdynamic left ventricle.  RECOMMENDATION  Optimize medical management -consider adding long-acting nitrate versus Ranexa.  Aggressive cardiovascular risk modification.  Recommend Aspirin 61m daily for moderate CAD.    Neuro/Psych PSYCHIATRIC DISORDERS Anxiety Depression    GI/Hepatic Neg liver ROS, GERD  Medicated and Controlled,  Endo/Other  diabetes, Type 2, Oral Hypoglycemic Agents   Renal/GU negative Renal ROS  negative genitourinary   Musculoskeletal  (+) Arthritis , Osteoarthritis,    Abdominal   Peds  Hematology negative hematology ROS (+)   Anesthesia Other Findings   Reproductive/Obstetrics negative OB ROS                            Anesthesia Physical  Anesthesia Plan  ASA: II  Anesthesia Plan: General   Post-op Pain Management:    Induction: Intravenous  PONV Risk Score and Plan: 4 or greater and Ondansetron, Dexamethasone, Scopolamine patch - Pre-op, Treatment may vary due to age or medical condition and Propofol infusion  Airway Management Planned: Oral ETT  Additional Equipment:   Intra-op Plan:   Post-operative Plan: Extubation in OR  Informed Consent: I have reviewed the patients History and Physical, chart, labs and discussed the procedure including the risks, benefits and alternatives for the proposed anesthesia with the patient or authorized representative who has indicated his/her understanding and acceptance.     Dental advisory given  Plan Discussed with: Anesthesiologist and CRNA  Anesthesia Plan Comments:        Anesthesia Quick Evaluation

## 2020-03-25 NOTE — Progress Notes (Signed)
Subjective: The patient is alert and pleasant.  She looks well.  She is in no apparent distress.  Objective: Vital signs in last 24 hours: Temp:  [97 F (36.1 C)-98.7 F (37.1 C)] 97 F (36.1 C) (10/11 1820) Pulse Rate:  [61-87] 87 (10/11 1850) Resp:  [11-20] 20 (10/11 1850) BP: (134-166)/(60-66) 144/66 (10/11 1850) SpO2:  [91 %-100 %] 96 % (10/11 1850) Weight:  [79.5 kg] 79.5 kg (10/11 0950) Estimated body mass index is 30.07 kg/m as calculated from the following:   Height as of this encounter: 5' 4"  (1.626 m).   Weight as of this encounter: 79.5 kg.   Intake/Output from previous day: No intake/output data recorded. Intake/Output this shift: Total I/O In: 2510 [I.V.:2000; IV Piggyback:510] Out: 395 [Urine:195; Blood:200]  Physical exam the patient is alert and pleasant.  She is moving her lower extremities well.  Lab Results: No results for input(s): WBC, HGB, HCT, PLT in the last 72 hours. BMET No results for input(s): NA, K, CL, CO2, GLUCOSE, BUN, CREATININE, CALCIUM in the last 72 hours.  Studies/Results: No results found.  Assessment/Plan: The patient is doing well.  I have spoken with her sister.  LOS: 0 days     [name] 03/25/2020, 6:53 PM

## 2020-03-26 LAB — BASIC METABOLIC PANEL
Anion gap: 10 (ref 5–15)
BUN: 16 mg/dL (ref 8–23)
CO2: 26 mmol/L (ref 22–32)
Calcium: 9.1 mg/dL (ref 8.9–10.3)
Chloride: 106 mmol/L (ref 98–111)
Creatinine, Ser: 1.05 mg/dL — ABNORMAL HIGH (ref 0.44–1.00)
GFR, Estimated: 54 mL/min — ABNORMAL LOW (ref >60)
Glucose, Bld: 164 mg/dL — ABNORMAL HIGH (ref 70–99)
Potassium: 4.2 mmol/L (ref 3.5–5.1)
Sodium: 142 mmol/L (ref 135–145)

## 2020-03-26 LAB — CBC
HCT: 28 % — ABNORMAL LOW (ref 36.0–46.0)
Hemoglobin: 8.8 g/dL — ABNORMAL LOW (ref 12.0–15.0)
MCH: 27.1 pg (ref 26.0–34.0)
MCHC: 31.4 g/dL (ref 30.0–36.0)
MCV: 86.2 fL (ref 80.0–100.0)
Platelets: 218 10*3/uL (ref 150–400)
RBC: 3.25 MIL/uL — ABNORMAL LOW (ref 3.87–5.11)
RDW: 14 % (ref 11.5–15.5)
WBC: 9.7 10*3/uL (ref 4.0–10.5)
nRBC: 0 % (ref 0.0–0.2)

## 2020-03-26 LAB — HEMOGLOBIN A1C
Hgb A1c MFr Bld: 5.9 % — ABNORMAL HIGH (ref 4.8–5.6)
Mean Plasma Glucose: 122.63 mg/dL

## 2020-03-26 LAB — GLUCOSE, CAPILLARY: Glucose-Capillary: 148 mg/dL — ABNORMAL HIGH (ref 70–99)

## 2020-03-26 MED ORDER — HYDROMORPHONE HCL 2 MG PO TABS: 2.0000 mg | ORAL_TABLET | ORAL | 0 refills | Status: DC | PRN

## 2020-03-26 MED ORDER — DOCUSATE SODIUM 100 MG PO CAPS
100.0000 mg | ORAL_CAPSULE | Freq: Two times a day (BID) | ORAL | 0 refills | Status: DC
Start: 2020-03-26 — End: 2020-08-30

## 2020-03-26 MED ORDER — CYCLOBENZAPRINE HCL 10 MG PO TABS: 10.0000 mg | ORAL_TABLET | Freq: Three times a day (TID) | ORAL | 0 refills | Status: DC | PRN

## 2020-03-26 MED FILL — Thrombin For Soln 5000 Unit: CUTANEOUS | Qty: 5000 | Status: AC

## 2020-03-26 NOTE — Plan of Care (Signed)
Pt and sister given D/C instructions with verbal understanding. Rx's were e-prescribed to Pt's pharmacy by MD. Pt's incision is clean and dry with no sign of infection. Pt's IV was removed prior to D/C. Home Health was discussed with TOC and she was attempting to get for the Pt. Pt received RW and 3-n-1 from [facility] prior to D/C. Pt D/C'd home via wheelchair per MD order. Pt is stable @ D/C and has no other needs at this time. Holli Humbles, RN

## 2020-03-26 NOTE — Progress Notes (Signed)
Physical Therapy Treatment Patient Details Name: Elaine Roach MRN: [id number] DOB: 01-25-1951 Today's Date: 03/26/2020    History of Present Illness Pt is Elaine 69 y/o female with PMH of CAD, arthritis, OCD, HTN, OA, pneumonia, ACDF, back surgery x 3, R shoulder rotator cuff repair, admitted for L3-4 fusion and extension of her posterior lateral arthrodesis instrumentation to T10.     PT Comments    Returned this afternoon to work on gait training with use of RW and to instruct pt on how to donn/doff TLSO brace. Balance and safety improved with use of RW for ambulation. Continues to have some drifting in both directions and mildly unsteady but only when taking hand off RW. Recommend constant use of RW to decrease fall risk. Pt with difficulty attempting to donn TLSO despite cues. Will need assist from family. Encouraged pt's sister to come in and learn how to manage and donn/doff brace to be able to assist at home. Needs cues for precautions and safety. Will continue to follow.    Follow Up Recommendations  Home health PT;Supervision for mobility/OOB     Equipment Recommendations  Rolling walker with 5" wheels    Recommendations for Other Services       Precautions / Restrictions Precautions Precautions: Back Precaution Booklet Issued: Yes (comment) Precaution Comments: Reviewed back precautions and handout Required Braces or Orthoses: Spinal Brace Spinal Brace: Thoracolumbosacral orthotic;Applied in sitting position Restrictions Weight Bearing Restrictions: No    Mobility  Bed Mobility Overal bed mobility: Needs Assistance Bed Mobility: Rolling;Sidelying to Sit;Sit to Sidelying Rolling: Min guard Sidelying to sit: Min assist     Sit to sidelying: Min guard General bed mobility comments: Sitting EOB upon PT arrival.  Transfers Overall transfer level: Needs assistance Equipment used: Rolling walker (2 wheeled) Transfers: Sit to/from Stand Sit to Stand: Min guard          General transfer comment: Min guard for safety. Stood from [employer]. Cues for hand placement as pt wanting to pull up on RW.  Ambulation/Gait Ambulation/Gait assistance: Min assist;Min guard Gait Distance (Feet): 450 Feet Assistive device: Rolling walker (2 wheeled) Gait Pattern/deviations: Step-through pattern;Decreased stride length;Drifts right/left Gait velocity: varying speeds   General Gait Details: Slow, mildly unsteady gait esp when taking UE off RW. Drifting in both directions. Safer with use of RW.   Stairs Stairs: Yes Stairs assistance: Min assist Stair Management: Step to pattern;Alternating pattern;One rail Right Number of Stairs: 5 General stair comments: Cues for technique and safety; Min Elaine for safety/balance.   Wheelchair Mobility    Modified Rankin (Stroke Patients Only)       Balance Overall balance assessment: Needs assistance Sitting-balance support: Feet supported;No upper extremity supported Sitting balance-Leahy Scale: Fair Sitting balance - Comments: Difficulty donning brace requiring assist.   Standing balance support: During functional activity Standing balance-Leahy Scale: Poor Standing balance comment: Requires UE support in standing for safety.                            Cognition Arousal/Alertness: Awake/alert Behavior During Therapy: WFL for tasks assessed/performed Overall Cognitive Status: No family/caregiver present to determine baseline cognitive functioning Area of Impairment: Safety/judgement;Problem solving;Memory                     Memory: Decreased recall of precautions;Decreased short-term memory   Safety/Judgement: Decreased awareness of deficits;Decreased awareness of safety   Problem Solving: Requires verbal cues General Comments: Pt with  difficulty problem solving how to donn/doff brace despite going through it Elaine few times with this therapist; will need assist. Continues to demonstrate poor safety  awareness.      Exercises      General Comments General comments (skin integrity, edema, etc.): Poor carryover for brace management.      Pertinent Vitals/Pain Pain Assessment: Faces Pain Score: 5  Faces Pain Scale: Hurts Elaine little bit Pain Location: back  Pain Descriptors / Indicators: Discomfort;Operative site guarding Pain Intervention(s): Monitored during session;Repositioned    Home Living Family/patient expects to be discharged to:: Private residence Living Arrangements: Alone Available Help at Discharge: Family;Available PRN/intermittently Type of Home: House Home Access: Stairs to enter Entrance Stairs-Rails: Left Home Layout: One level Home Equipment: None      Prior Function Level of Independence: Independent      Comments: reports frequent falls but independent with ADLs, IADLs and driving. Reports ~15 falls in last 6 months.   PT Goals (current goals can now be found in the care plan section) Acute Rehab PT Goals Patient Stated Goal: home today  PT Goal Formulation: With patient Time For Goal Achievement: 04/09/20 Potential to Achieve Goals: Good Progress towards PT goals: Progressing toward goals    Frequency    Min 5X/week      PT Plan Current plan remains appropriate    Co-evaluation              AM-PAC PT "6 Clicks" Mobility   Outcome Measure  Help needed turning from your back to your side while in Elaine flat bed without using bedrails?: None Help needed moving from lying on your back to sitting on the side of Elaine flat bed without using bedrails?: Elaine Little Help needed moving to and from Elaine bed to Elaine chair (including Elaine wheelchair)?: Elaine Little Help needed standing up from Elaine chair using your arms (e.g., wheelchair or bedside chair)?: Elaine Little Help needed to walk in hospital room?: Elaine Little Help needed climbing 3-5 steps with Elaine railing? : Elaine Little 6 Click Score: 19    End of Session Equipment Utilized During Treatment: Back brace;Gait  belt Activity Tolerance: Patient tolerated treatment well Patient left: in bed;with call bell/phone within reach (sitting EOB) Nurse Communication: Mobility status PT Visit Diagnosis: Pain;Difficulty in walking, not elsewhere classified (R26.2);Unsteadiness on feet (R26.81);Repeated falls (R29.6) Pain - part of body:  (back)     Time: 1108-1130 PT Time Calculation (min) (ACUTE ONLY): 22 min  Charges:  $Therapeutic Activity: 8-22 mins                     Marisa Severin, PT, DPT Acute Rehabilitation Services Pager [phone] Office [phone]       Elaine Roach Elaine Roach 03/26/2020, 12:58 PM

## 2020-03-26 NOTE — Evaluation (Signed)
Physical Therapy Evaluation Patient Details Name: Elaine Roach MRN: [id number] DOB: [birth year] Today's Date: 03/26/2020   History of Present Illness  Pt is a 69 y/o female with PMH of CAD, arthritis, OCD, HTN, OA, pneumonia, ACDF, back surgery x 3, R shoulder rotator cuff repair, admitted for L3-4 fusion and extension of her posterior lateral arthrodesis instrumentation to T10.   Clinical Impression  Patient presents with pain, impaired balance, and post surgical deficits s/p above. Pt lives alone and reports being independent for ADLs/IADLs and driving PTA. Reports lots of falls, ~15 in last 6 months. Pt with cognitive deficits relating to safety, problem solving and precautions. Today, pt requires min A for steadying during transfers and Min-mod A for gait training without use of DME due to imbalance and a few almost falls staggering in both directions. Would likely benefit from RW use. Tolerated stair training with Min A for balance/safety. Education re: back precautions, log roll technique, use of RW, positioning, walking, safety etc. Will return in PM for instruction on how to donn/doff TLSO and for safety with use of RW. Will follow acutely to maximize independence and mobility prior to return home.    Follow Up Recommendations Home health PT;Supervision for mobility/OOB    Equipment Recommendations  Rolling walker with 5" wheels    Recommendations for Other Services       Precautions / Restrictions Precautions Precautions: Back Precaution Booklet Issued: Yes (comment) Precaution Comments: Reviewed back precautions and handout Required Braces or Orthoses:  (TLSo not present during eval) Restrictions Weight Bearing Restrictions: No      Mobility  Bed Mobility Overal bed mobility: Needs Assistance Bed Mobility: Rolling;Sidelying to Sit;Sit to Sidelying Rolling: Min guard Sidelying to sit: Min assist     Sit to sidelying: Min guard General bed mobility comments: Cues for  log roll technique with HOB flat and no use of rails. Difficulty coming upright with multiple attempts and use of momentum. Posterior lean.  Transfers Overall transfer level: Needs assistance Equipment used: None Transfers: Sit to/from Stand Sit to Stand: Min guard         General transfer comment: Min guard for safety. Stood from [employer], from toilet x1, imbalance present. LOB whent urning to get back to bed needing assist to prevent fall.  Ambulation/Gait Ambulation/Gait assistance: Min assist;Mod assist Gait Distance (Feet): 100 Feet Assistive device: None Gait Pattern/deviations: Step-through pattern;Decreased stride length;Staggering right;Staggering left Gait velocity: varying speeds   General Gait Details: Unsteady gait with staggering in both directions, reaching for rail at times, some scissoring present. Min-mod A for balance/safety.  Stairs Stairs: Yes Stairs assistance: Min assist Stair Management: Step to pattern;Alternating pattern;One rail Right Number of Stairs: 5 General stair comments: Cues for technique and safety; Min A for safety/balance.  Wheelchair Mobility    Modified Rankin (Stroke Patients Only)       Balance Overall balance assessment: Needs assistance Sitting-balance support: Feet supported;No upper extremity supported Sitting balance-Leahy Scale: Fair     Standing balance support: During functional activity Standing balance-Leahy Scale: Fair Standing balance comment: able to engage in ADls statically without UE support but relies on BUE support dynamically                              Pertinent Vitals/Pain Pain Assessment: 0-10 Pain Score: 5  Faces Pain Scale: Hurts little more Pain Location: back  Pain Descriptors / Indicators: Discomfort;Grimacing;Operative site guarding Pain Intervention(s): Monitored during  session;Repositioned;Limited activity within patient's tolerance    Home Living Family/patient expects to be  discharged to:: Private residence Living Arrangements: Alone Available Help at Discharge: Family;Available PRN/intermittently Type of Home: House Home Access: Stairs to enter Entrance Stairs-Rails: Left Entrance Stairs-Number of Steps: 3 Home Layout: One level Home Equipment: None      Prior Function Level of Independence: Independent         Comments: reports frequent falls but independent with ADLs, IADLs and driving. Reports ~15 falls in last 6 months.     Hand Dominance        Extremity/Trunk Assessment   Upper Extremity Assessment Upper Extremity Assessment: Defer to OT evaluation    Lower Extremity Assessment Lower Extremity Assessment: Generalized weakness    Cervical / Trunk Assessment Cervical / Trunk Assessment: Other exceptions Cervical / Trunk Exceptions: s/p back surgery   Communication   Communication: No difficulties  Cognition Arousal/Alertness: Awake/alert Behavior During Therapy: WFL for tasks assessed/performed Overall Cognitive Status: No family/caregiver present to determine baseline cognitive functioning Area of Impairment: Safety/judgement;Problem solving                         Safety/Judgement: Decreased awareness of deficits;Decreased awareness of safety   Problem Solving: Requires verbal cues General Comments: patient presenting with decreased safety awareness and problem solving. Multiple instances of LOB, "I amg ood, I am fine."      General Comments General comments (skin integrity, edema, etc.): min cueing for RW safety and mgmt, poor carryover     Exercises     Assessment/Plan    PT Assessment Patient needs continued PT services  PT Problem List Decreased strength;Decreased safety awareness;Decreased mobility;Decreased knowledge of precautions;Decreased skin integrity;Pain;Decreased balance;Decreased activity tolerance;Decreased cognition       PT Treatment Interventions Therapeutic activities;DME  instruction;Gait training;Cognitive remediation;Patient/family education;Therapeutic exercise;Balance training;Stair training;Functional mobility training;Neuromuscular re-education    PT Goals (Current goals can be found in the Care Plan section)  Acute Rehab PT Goals Patient Stated Goal: home today  PT Goal Formulation: With patient Time For Goal Achievement: 04/09/20 Potential to Achieve Goals: Good    Frequency Min 5X/week   Barriers to discharge Decreased caregiver support;Inaccessible home environment lives alone, 56 y/o sister lives next doot. Stairs to enter home    Co-evaluation               AM-PAC PT "6 Clicks" Mobility  Outcome Measure Help needed turning from your back to your side while in a flat bed without using bedrails?: None Help needed moving from lying on your back to sitting on the side of a flat bed without using bedrails?: A Little Help needed moving to and from a bed to a chair (including a wheelchair)?: A Little Help needed standing up from a chair using your arms (e.g., wheelchair or bedside chair)?: A Little Help needed to walk in hospital room?: A Lot Help needed climbing 3-5 steps with a railing? : A Little 6 Click Score: 18    End of Session Equipment Utilized During Treatment: Gait belt Activity Tolerance: Patient tolerated treatment well Patient left: in bed;with call bell/phone within reach Nurse Communication: Mobility status PT Visit Diagnosis: Pain;Difficulty in walking, not elsewhere classified (R26.2);Unsteadiness on feet (R26.81);Repeated falls (R29.6) Pain - part of body:  (back)    Time: 0812-0832 PT Time Calculation (min) (ACUTE ONLY): 20 min   Charges:   PT Evaluation $PT Eval Moderate Complexity: 1 Mod  Zettie Cooley, DPT Acute Rehabilitation Services Pager [phone] Office [phone]      [place] 03/26/2020, 10:59 AM

## 2020-03-26 NOTE — Discharge Summary (Signed)
Physician Discharge Summary  Patient ID: Elaine Roach MRN: [id number] DOB/AGE: 1950-11-14 69 y.o.  Admit date: 03/25/2020 Discharge date: 03/26/2020  Admission Diagnoses: lumbar pseudoarthrosis, lumbar degenerative disease, lumbago, lumbar radicuopathy  Discharge Diagnoses:  The same Active Problems:   Pseudoarthrosis of lumbar spine   Discharged Condition: good  Hospital Course:  I performed an exploration inflations lumbar fusion with instrumentation and fusion from  T10-L4 on 03/25/2020.  The surgery went well.    The patient's postoperative course was unremarkable.  On postoperative day 1. The patient requested discharge home.  Arrangements were made for home PT and OT .  She was given written in oral discharge instructions.  All her questions were answered.  Consults:  PT, OT, care management Significant Diagnostic Studies: none Treatments: exploration of lumbar fusion, decompression, instrumentation fusion T10-L4 Discharge Exam: Blood pressure (!) 124/56, pulse 82, temperature 98.7 F (37.1 C), temperature source Oral, resp. rate 16, height 5' 4"  (1.626 m), weight 79.5 kg, SpO2 94 %.  the patient is alert and pleasant.  She looks well.  Her lower extremity strength is normal.  Disposition:   Home  Discharge Instructions    Call MD for:  difficulty breathing, headache or visual disturbances   Complete by: As directed    Call MD for:  extreme fatigue   Complete by: As directed    Call MD for:  hives   Complete by: As directed    Call MD for:  persistant dizziness or light-headedness   Complete by: As directed    Call MD for:  persistant nausea and vomiting   Complete by: As directed    Call MD for:  redness, tenderness, or signs of infection (pain, swelling, redness, odor or green/yellow discharge around incision site)   Complete by: As directed    Call MD for:  severe uncontrolled pain   Complete by: As directed    Call MD for:  temperature >100.4   Complete by:  As directed    Diet - low sodium heart healthy   Complete by: As directed    Discharge instructions   Complete by: As directed    Call [phone] for a followup appointment. Take a stool softener while you are using pain medications.   Driving Restrictions   Complete by: As directed    Do not drive for 2 weeks.   Increase activity slowly   Complete by: As directed    Lifting restrictions   Complete by: As directed    Do not lift more than 5 pounds. No excessive bending or twisting.   May shower / Bathe   Complete by: As directed    Remove the dressing for 3 days after surgery.  You may shower, but leave the incision alone.   Remove dressing in 48 hours   Complete by: As directed      Allergies as of 03/26/2020      Reactions   Codeine Itching, Swelling   Facial swelling   Lisinopril Hives   Tape Rash   Surgical tape       Medication List    STOP taking these medications   ibuprofen 200 MG tablet Commonly known as: ADVIL   zolpidem 10 MG tablet Commonly known as: AMBIEN     TAKE these medications   aspirin 81 MG EC tablet Take 81 mg by mouth every evening.   atorvastatin 80 MG tablet Commonly known as: LIPITOR Take 80 mg by mouth every evening.   cetirizine 10 MG  tablet Commonly known as: ZYRTEC Take 10 mg by mouth daily.   cyclobenzaprine 10 MG tablet Commonly known as: FLEXERIL Take 1 tablet (10 mg total) by mouth 3 (three) times daily as needed for muscle spasms.   diltiazem 240 MG 24 hr capsule Commonly known as: CARDIZEM CD TAKE 1 CAPSULE BY MOUTH DAILY   docusate sodium 100 MG capsule Commonly known as: COLACE Take 1 capsule (100 mg total) by mouth 2 (two) times daily.   ezetimibe 10 MG tablet Commonly known as: ZETIA Take 10 mg by mouth every evening.   HYDROmorphone 2 MG tablet Commonly known as: DILAUDID Take 1-2 tablets (2-4 mg total) by mouth every 4 (four) hours as needed for moderate pain. What changed:   how much to take  when  to take this  reasons to take this   hydroxypropyl methylcellulose / hypromellose 2.5 % ophthalmic solution Commonly known as: ISOPTO TEARS / GONIOVISC Place 1 drop into both eyes 2 (two) times daily as needed for dry eyes.   isosorbide mononitrate 60 MG 24 hr tablet Commonly known as: IMDUR TAKE 1 TABLET(60 MG) BY MOUTH DAILY What changed: See the new instructions.   losartan 50 MG tablet Commonly known as: COZAAR Take 1 tablet (50 mg total) by mouth daily as needed. Take if your Blood Pressure is greater than 160 on top. What changed:   reasons to take this  additional instructions   metFORMIN 500 MG tablet Commonly known as: GLUCOPHAGE Take 500 mg by mouth 2 (two) times daily with a meal.   nitroGLYCERIN 0.4 MG SL tablet Commonly known as: NITROSTAT Place 1 tablet (0.4 mg total) under the tongue every 5 (five) minutes as needed for chest pain.   omeprazole 40 MG capsule Commonly known as: PRILOSEC Take 40 mg by mouth every morning.   Precision QID Test test strip Generic drug: glucose blood 1 strip by Misc.(Non-Drug; Combo Route) route daily.   ranolazine 500 MG 12 hr tablet Commonly known as: RANEXA TAKE 1 TABLET(500 MG) BY MOUTH TWICE DAILY What changed: See the new instructions.   sertraline 100 MG tablet Commonly known as: ZOLOFT Take 100 mg by mouth at bedtime.   Vitamin D3 125 MCG (5000 UT) Caps Take 5,000 Units by mouth daily.            Durable Medical Equipment  (From admission, onward)         Start     Ordered   03/26/20 1029  For home use only DME Walker rolling  Once       Question Answer Comment  Walker: With 5 Inch Wheels   Patient needs a walker to treat with the following condition Status post spinal surgery      03/26/20 1028   03/26/20 1029  For home use only DME 3 n 1  Once        03/26/20 1028          Follow-up Information    [facility], [facility] Follow up.   Specialty: Home Health Services Why: Your home health  has been set up with [facility]. The agency will call you with the start of service dates. If you have any questions please call the number listed above.  Contact information: [place] [address] [phone]               Signed: [name] 03/26/2020, 12:48 PM

## 2020-03-26 NOTE — Progress Notes (Signed)
Subjective: The patient is alert and pleasant.  She looks well.  She is in no apparent distress.  She thinks she wants to go home later on today.  Objective: Vital signs in last 24 hours: Temp:  [97 F (36.1 C)-98.7 F (37.1 C)] 98.2 F (36.8 C) (10/12 0337) Pulse Rate:  [61-92] 77 (10/12 0337) Resp:  [11-20] 18 (10/12 0337) BP: (116-166)/(60-76) 116/66 (10/12 0337) SpO2:  [91 %-100 %] 100 % (10/12 0337) Weight:  [79.5 kg] 79.5 kg (10/11 0950) Estimated body mass index is 30.07 kg/m as calculated from the following:   Height as of this encounter: 5' 4"  (1.626 m).   Weight as of this encounter: 79.5 kg.   Intake/Output from previous day: 10/11 0701 - 10/12 0700 In: 2610 [I.V.:2100; IV Piggyback:510] Out: 585 [Urine:385; Blood:200] Intake/Output this shift: No intake/output data recorded.  Physical exam the patient is alert and oriented.  Her lower extremity strength is normal.  She looks well.  Lab Results: Recent Labs    03/26/20 0334  WBC 9.7  HGB 8.8*  HCT 28.0*  PLT 218   BMET Recent Labs    03/26/20 0334  NA 142  K 4.2  CL 106  CO2 26  GLUCOSE 164*  BUN 16  CREATININE 1.05*  CALCIUM 9.1    Studies/Results: DG Thoracolumabar Spine  Result Date: 03/25/2020 CLINICAL DATA:  69 year old female with prior thoracolumbar surgery undergoing revision of fusion. EXAM: THORACOLUMBAR SPINE 1V COMPARISON:  Total spine CT myelogram 11/30/2019. FINDINGS: Four intraoperative fluoroscopic spot views of the thoracolumbar spine. Initial images demonstrate what appears to be the existing 3 level pedicle screw placement L1 through L3. Subsequent images demonstrate placement of additional screws including L4. Chronic left S1 screw fragment again noted. Chronic lumbar interbody implants. IMPRESSION: Revision of lower spinal fusion hardware underway. FLUOROSCOPY TIME:  0 minutes 31 seconds Electronically Signed   By: Genevie Ann M.D.   On: 03/25/2020 19:34   DG C-Arm 1-60  Min  Result Date: 03/25/2020 CLINICAL DATA:  69 year old female with prior thoracolumbar surgery undergoing revision of fusion. EXAM: THORACOLUMBAR SPINE 1V COMPARISON:  Total spine CT myelogram 11/30/2019. FINDINGS: Four intraoperative fluoroscopic spot views of the thoracolumbar spine. Initial images demonstrate what appears to be the existing 3 level pedicle screw placement L1 through L3. Subsequent images demonstrate placement of additional screws including L4. Chronic left S1 screw fragment again noted. Chronic lumbar interbody implants. IMPRESSION: Revision of lower spinal fusion hardware underway. FLUOROSCOPY TIME:  0 minutes 31 seconds Electronically Signed   By: Genevie Ann M.D.   On: 03/25/2020 19:34    Assessment/Plan: Postop day #1: The patient may go home after she works with PT and OT and has her TLSO.  I gave her discharge instructions and answered all her questions.  LOS: 1 day     [name] 03/26/2020, 7:32 AM     Patient ID: Elaine Roach, female   DOB: January 07, 1951, 69 y.o.   MRN: [id number]

## 2020-03-26 NOTE — Evaluation (Signed)
Occupational Therapy Evaluation Patient Details Name: NALAH MACIOCE MRN: [id number] DOB: 12-Jan-1951 Today's Date: 03/26/2020    History of Present Illness Pt is a 69 y/o female with PMH of CAD, arthritis, OCD, HTN, OA, pneumonia, ACDF, back surgery x 3, R shoulder rotator cuff repair, admitted for L3-4 fusion and extension of her posterior lateral arthrodesis instrumentation to T10.    Clinical Impression   PTA patient independent, driving and completing IADLs. Admitted for above and limited by problem list below, including impaired balance, decreased activity tolerance, back precautions and pain.  Patient educated on back precautions, ADL compensatory techniques, recommendations, DME, safety.  She requires min guard to supervision for ADLs, min guard for in room mobility and transfers using RW. Patient with poor walker mgmt and carryover techniques to reduce fall prevention at home.  Patient reports her sister can assist intermittently, as needed.  Discussed energy conservation, home safety and fall prevention.  Agreeable to chair for shower, but reports will "pick one up on the way home", recommend 3:1 for shower chair to ensure pt has one when showering at home. Believe she will benefit from continued OT services while admitted and after dc at [facility] level to optimize independence, safety and return to PLOF with ADLs/mobility.     Follow Up Recommendations  Home health OT;Supervision - Intermittent (Assist for ADLs/moblity for safety )    Equipment Recommendations  3 in 1 bedside commode    Recommendations for Other Services       Precautions / Restrictions Precautions Precautions: Back Precaution Booklet Issued: Yes (comment) Precaution Comments: reviewed with pt, min cueing functionally  Required Braces or Orthoses:  (TLSO not present during eval ) Restrictions Weight Bearing Restrictions: No      Mobility Bed Mobility Overal bed mobility: Needs Assistance Bed Mobility:  Rolling;Sidelying to Sit;Sit to Sidelying Rolling: Min guard Sidelying to sit: Min guard     Sit to sidelying: Min guard General bed mobility comments: to ensure log roll technique   Transfers Overall transfer level: Needs assistance Equipment used: Rolling walker (2 wheeled) Transfers: Sit to/from Stand Sit to Stand: Min guard         General transfer comment: cueing for hand placement and safety, poor carryover     Balance Overall balance assessment: Needs assistance Sitting-balance support: No upper extremity supported;Feet supported Sitting balance-Leahy Scale: Fair     Standing balance support: Bilateral upper extremity supported;During functional activity;No upper extremity supported Standing balance-Leahy Scale: Fair Standing balance comment: able to engage in ADls statically without UE support but relies on BUE support dynamically                            ADL either performed or assessed with clinical judgement   ADL Overall ADL's : Needs assistance/impaired     Grooming: Min guard;Standing Grooming Details (indicate cue type and reason): cueing for compensatory techniques, min cueing for adherance to back precautions  Upper Body Bathing: Set up;Sitting   Lower Body Bathing: Sit to/from stand;Min guard   Upper Body Dressing : Set up;Sitting   Lower Body Dressing: Min guard;Sit to/from stand Lower Body Dressing Details (indicate cue type and reason): able to complete figure 4 technique with supervision, min guard sit to stand with cueing for hand placement  Toilet Transfer: Min guard;Ambulation;RW           Functional mobility during ADLs: Min guard;Rolling walker;Cueing for safety General ADL Comments: pt limited by decreased  safety, back precaution adherance; reviewed techniques but continues to require min cueing for safety      Vision         Perception     Praxis      Pertinent Vitals/Pain Pain Assessment: Faces Faces Pain  Scale: Hurts little more Pain Location: back  Pain Descriptors / Indicators: Discomfort;Grimacing;Operative site guarding Pain Intervention(s): Limited activity within patient's tolerance;Monitored during session;Repositioned     Hand Dominance     Extremity/Trunk Assessment Upper Extremity Assessment Upper Extremity Assessment: Generalized weakness   Lower Extremity Assessment Lower Extremity Assessment: Defer to PT evaluation   Cervical / Trunk Assessment Cervical / Trunk Assessment: Other exceptions Cervical / Trunk Exceptions: s/p back surgery    Communication Communication Communication: No difficulties   Cognition Arousal/Alertness: Awake/alert Behavior During Therapy: WFL for tasks assessed/performed Overall Cognitive Status: No family/caregiver present to determine baseline cognitive functioning Area of Impairment: Safety/judgement;Problem solving                         Safety/Judgement: Decreased awareness of deficits;Decreased awareness of safety   Problem Solving: Requires verbal cues General Comments: patient presenting with decreased safety awareness and problem solving    General Comments  min cueing for RW safety and mgmt, poor carryover     Exercises     Shoulder Instructions      Home Living Family/patient expects to be discharged to:: Private residence Living Arrangements: Alone Available Help at Discharge: Family;Available PRN/intermittently Type of Home: House       Home Layout: One level     Bathroom Shower/Tub: Occupational psychologist: Standard     Home Equipment: None          Prior Functioning/Environment Level of Independence: Independent        Comments: reports frequent falls but independent with ADLs, IADLs and driving         OT Problem List: Decreased strength;Decreased activity tolerance;Impaired balance (sitting and/or standing);Pain;Decreased knowledge of precautions;Decreased knowledge of use  of DME or AE;Decreased safety awareness      OT Treatment/Interventions: Self-care/ADL training;DME and/or AE instruction;Therapeutic activities;Patient/family education;Balance training    OT Goals(Current goals can be found in the care plan section) Acute Rehab OT Goals Patient Stated Goal: home today  OT Goal Formulation: With patient Time For Goal Achievement: 04/09/20 Potential to Achieve Goals: Good  OT Frequency: Min 2X/week   Barriers to D/C:            Co-evaluation              AM-PAC OT "6 Clicks" Daily Activity     Outcome Measure Help from another person eating meals?: None Help from another person taking care of personal grooming?: A Little Help from another person toileting, which includes using toliet, bedpan, or urinal?: A Little Help from another person bathing (including washing, rinsing, drying)?: A Little Help from another person to put on and taking off regular upper body clothing?: A Little Help from another person to put on and taking off regular lower body clothing?: A Little 6 Click Score: 19   End of Session Equipment Utilized During Treatment: Rolling walker Nurse Communication: Mobility status  Activity Tolerance: Patient tolerated treatment well Patient left: in bed;with call bell/phone within reach  OT Visit Diagnosis: Other abnormalities of gait and mobility (R26.89);Pain Pain - part of body:  (back- incisonal)  Time: 0900-0920 OT Time Calculation (min): 20 min Charges:  OT General Charges $OT Visit: 1 Visit OT Evaluation $OT Eval Moderate Complexity: 1 Mod  Jolaine Artist, OT Acute Rehabilitation Services Pager [phone] Office [phone]   Delight Stare 03/26/2020, 10:14 AM

## 2020-03-26 NOTE — TOC Transition Note (Signed)
Transition of Care [facility]) - CM/SW Discharge Note   Patient Details  Name: Elaine Roach MRN: [id number] Date of Birth: [birth year]  Transition of Care [facility]) CM/SW Contact:  Angelita Ingles, RN Phone Number: [phone]  03/26/2020, 11:49 AM   Clinical Narrative:    [facility] consult for [facility] needs. List of choices given to the patient. Home health has been arranged with [facility]. Info has been added to the AVS. No further needs noted at this time. CM will sign off.   Final next level of care: [place] Barriers to Discharge: No Barriers Identified   Patient Goals and CMS Choice Patient states their goals for this hospitalization and ongoing recovery are:: Patient is ready to go home CMS Medicare.gov Compare Post Acute Care list provided to:: Patient Choice offered to / list presented to : Patient  Discharge Placement                       Discharge Plan and Services                DME Arranged: N/A (DME set up by unit) DME Agency: [employer] (DME  arranged per unit)       HH Arranged: PT, OT HH Agency: [place] Date [facility] Agency Contacted: 03/26/20 Time [place]: 0867 Representative spoke with at [place]: [place] ([place]) Interventions     Readmission Risk Interventions No flowsheet data found.

## 2020-03-27 ENCOUNTER — Ambulatory Visit: Payer: PPO

## 2020-03-28 MED FILL — Heparin Sodium (Porcine) Inj 1000 Unit/ML: INTRAMUSCULAR | Qty: 30 | Status: AC

## 2020-03-28 MED FILL — Sodium Chloride IV Soln 0.9%: INTRAVENOUS | Qty: 1000 | Status: AC

## 2020-03-29 DIAGNOSIS — K219 Gastro-esophageal reflux disease without esophagitis: Secondary | ICD-10-CM | POA: Diagnosis not present

## 2020-03-29 DIAGNOSIS — M48061 Spinal stenosis, lumbar region without neurogenic claudication: Secondary | ICD-10-CM | POA: Diagnosis not present

## 2020-03-29 DIAGNOSIS — K7581 Nonalcoholic steatohepatitis (NASH): Secondary | ICD-10-CM | POA: Diagnosis not present

## 2020-03-29 DIAGNOSIS — Z4789 Encounter for other orthopedic aftercare: Secondary | ICD-10-CM | POA: Diagnosis not present

## 2020-03-29 DIAGNOSIS — Z87891 Personal history of nicotine dependence: Secondary | ICD-10-CM | POA: Diagnosis not present

## 2020-03-29 DIAGNOSIS — G4700 Insomnia, unspecified: Secondary | ICD-10-CM | POA: Diagnosis not present

## 2020-03-29 DIAGNOSIS — Z8701 Personal history of pneumonia (recurrent): Secondary | ICD-10-CM | POA: Diagnosis not present

## 2020-03-29 DIAGNOSIS — I2510 Atherosclerotic heart disease of native coronary artery without angina pectoris: Secondary | ICD-10-CM | POA: Diagnosis not present

## 2020-03-29 DIAGNOSIS — M960 Pseudarthrosis after fusion or arthrodesis: Secondary | ICD-10-CM | POA: Diagnosis not present

## 2020-03-29 DIAGNOSIS — E782 Mixed hyperlipidemia: Secondary | ICD-10-CM | POA: Diagnosis not present

## 2020-03-29 DIAGNOSIS — Z7984 Long term (current) use of oral hypoglycemic drugs: Secondary | ICD-10-CM | POA: Diagnosis not present

## 2020-03-29 DIAGNOSIS — Z9181 History of falling: Secondary | ICD-10-CM | POA: Diagnosis not present

## 2020-03-29 DIAGNOSIS — F429 Obsessive-compulsive disorder, unspecified: Secondary | ICD-10-CM | POA: Diagnosis not present

## 2020-03-29 DIAGNOSIS — I119 Hypertensive heart disease without heart failure: Secondary | ICD-10-CM | POA: Diagnosis not present

## 2020-03-29 DIAGNOSIS — M5116 Intervertebral disc disorders with radiculopathy, lumbar region: Secondary | ICD-10-CM | POA: Diagnosis not present

## 2020-03-29 DIAGNOSIS — Z7982 Long term (current) use of aspirin: Secondary | ICD-10-CM | POA: Diagnosis not present

## 2020-03-29 DIAGNOSIS — Z981 Arthrodesis status: Secondary | ICD-10-CM | POA: Diagnosis not present

## 2020-03-29 DIAGNOSIS — E119 Type 2 diabetes mellitus without complications: Secondary | ICD-10-CM | POA: Diagnosis not present

## 2020-03-29 DIAGNOSIS — M150 Primary generalized (osteo)arthritis: Secondary | ICD-10-CM | POA: Diagnosis not present

## 2020-04-15 DIAGNOSIS — I119 Hypertensive heart disease without heart failure: Secondary | ICD-10-CM | POA: Diagnosis not present

## 2020-04-15 DIAGNOSIS — Z9181 History of falling: Secondary | ICD-10-CM | POA: Diagnosis not present

## 2020-04-15 DIAGNOSIS — Z7984 Long term (current) use of oral hypoglycemic drugs: Secondary | ICD-10-CM | POA: Diagnosis not present

## 2020-04-15 DIAGNOSIS — Z4789 Encounter for other orthopedic aftercare: Secondary | ICD-10-CM | POA: Diagnosis not present

## 2020-04-15 DIAGNOSIS — K7581 Nonalcoholic steatohepatitis (NASH): Secondary | ICD-10-CM | POA: Diagnosis not present

## 2020-04-15 DIAGNOSIS — K219 Gastro-esophageal reflux disease without esophagitis: Secondary | ICD-10-CM | POA: Diagnosis not present

## 2020-04-15 DIAGNOSIS — M960 Pseudarthrosis after fusion or arthrodesis: Secondary | ICD-10-CM | POA: Diagnosis not present

## 2020-04-15 DIAGNOSIS — M150 Primary generalized (osteo)arthritis: Secondary | ICD-10-CM | POA: Diagnosis not present

## 2020-04-15 DIAGNOSIS — G4700 Insomnia, unspecified: Secondary | ICD-10-CM | POA: Diagnosis not present

## 2020-04-15 DIAGNOSIS — M48061 Spinal stenosis, lumbar region without neurogenic claudication: Secondary | ICD-10-CM | POA: Diagnosis not present

## 2020-04-15 DIAGNOSIS — I2510 Atherosclerotic heart disease of native coronary artery without angina pectoris: Secondary | ICD-10-CM | POA: Diagnosis not present

## 2020-04-15 DIAGNOSIS — Z981 Arthrodesis status: Secondary | ICD-10-CM | POA: Diagnosis not present

## 2020-04-15 DIAGNOSIS — M5116 Intervertebral disc disorders with radiculopathy, lumbar region: Secondary | ICD-10-CM | POA: Diagnosis not present

## 2020-04-15 DIAGNOSIS — E119 Type 2 diabetes mellitus without complications: Secondary | ICD-10-CM | POA: Diagnosis not present

## 2020-04-15 DIAGNOSIS — Z7982 Long term (current) use of aspirin: Secondary | ICD-10-CM | POA: Diagnosis not present

## 2020-04-15 DIAGNOSIS — F429 Obsessive-compulsive disorder, unspecified: Secondary | ICD-10-CM | POA: Diagnosis not present

## 2020-04-15 DIAGNOSIS — Z87891 Personal history of nicotine dependence: Secondary | ICD-10-CM | POA: Diagnosis not present

## 2020-04-15 DIAGNOSIS — Z8701 Personal history of pneumonia (recurrent): Secondary | ICD-10-CM | POA: Diagnosis not present

## 2020-04-15 DIAGNOSIS — E782 Mixed hyperlipidemia: Secondary | ICD-10-CM | POA: Diagnosis not present

## 2020-04-26 DIAGNOSIS — S32009K Unspecified fracture of unspecified lumbar vertebra, subsequent encounter for fracture with nonunion: Secondary | ICD-10-CM | POA: Diagnosis not present

## 2020-06-28 ENCOUNTER — Ambulatory Visit: Payer: PPO

## 2020-07-04 DIAGNOSIS — Z0000 Encounter for general adult medical examination without abnormal findings: Secondary | ICD-10-CM | POA: Diagnosis not present

## 2020-07-04 DIAGNOSIS — Z7984 Long term (current) use of oral hypoglycemic drugs: Secondary | ICD-10-CM | POA: Diagnosis not present

## 2020-07-04 DIAGNOSIS — E782 Mixed hyperlipidemia: Secondary | ICD-10-CM | POA: Diagnosis not present

## 2020-07-04 DIAGNOSIS — R7989 Other specified abnormal findings of blood chemistry: Secondary | ICD-10-CM | POA: Diagnosis not present

## 2020-07-04 DIAGNOSIS — R0789 Other chest pain: Secondary | ICD-10-CM | POA: Insufficient documentation

## 2020-07-04 DIAGNOSIS — Z23 Encounter for immunization: Secondary | ICD-10-CM | POA: Diagnosis not present

## 2020-07-04 DIAGNOSIS — F419 Anxiety disorder, unspecified: Secondary | ICD-10-CM | POA: Diagnosis not present

## 2020-07-04 DIAGNOSIS — K7581 Nonalcoholic steatohepatitis (NASH): Secondary | ICD-10-CM | POA: Diagnosis not present

## 2020-07-04 DIAGNOSIS — I10 Essential (primary) hypertension: Secondary | ICD-10-CM | POA: Diagnosis not present

## 2020-07-04 DIAGNOSIS — E119 Type 2 diabetes mellitus without complications: Secondary | ICD-10-CM | POA: Diagnosis not present

## 2020-07-04 DIAGNOSIS — K219 Gastro-esophageal reflux disease without esophagitis: Secondary | ICD-10-CM | POA: Diagnosis not present

## 2020-07-19 DIAGNOSIS — M5124 Other intervertebral disc displacement, thoracic region: Secondary | ICD-10-CM | POA: Diagnosis not present

## 2020-07-19 DIAGNOSIS — M4324 Fusion of spine, thoracic region: Secondary | ICD-10-CM | POA: Diagnosis not present

## 2020-07-19 DIAGNOSIS — M419 Scoliosis, unspecified: Secondary | ICD-10-CM | POA: Diagnosis not present

## 2020-07-19 DIAGNOSIS — M47814 Spondylosis without myelopathy or radiculopathy, thoracic region: Secondary | ICD-10-CM | POA: Diagnosis not present

## 2020-07-19 DIAGNOSIS — M5134 Other intervertebral disc degeneration, thoracic region: Secondary | ICD-10-CM | POA: Diagnosis not present

## 2020-07-24 ENCOUNTER — Other Ambulatory Visit: Payer: Self-pay | Admitting: Neurosurgery

## 2020-07-29 DIAGNOSIS — K5730 Diverticulosis of large intestine without perforation or abscess without bleeding: Secondary | ICD-10-CM | POA: Diagnosis not present

## 2020-07-29 DIAGNOSIS — K219 Gastro-esophageal reflux disease without esophagitis: Secondary | ICD-10-CM | POA: Diagnosis not present

## 2020-08-02 DIAGNOSIS — Z1159 Encounter for screening for other viral diseases: Secondary | ICD-10-CM | POA: Diagnosis not present

## 2020-08-02 DIAGNOSIS — Z20828 Contact with and (suspected) exposure to other viral communicable diseases: Secondary | ICD-10-CM | POA: Diagnosis not present

## 2020-08-08 ENCOUNTER — Ambulatory Visit
Admission: RE | Admit: 2020-08-08 | Discharge: 2020-08-08 | Disposition: A | Discharge status: Home / Self Care | Payer: PPO | Source: Ambulatory Visit | Attending: Family Medicine | Admitting: Family Medicine

## 2020-08-08 ENCOUNTER — Other Ambulatory Visit: Payer: Self-pay

## 2020-08-08 DIAGNOSIS — Z1231 Encounter for screening mammogram for malignant neoplasm of breast: Secondary | ICD-10-CM | POA: Diagnosis not present

## 2020-08-09 DIAGNOSIS — K3189 Other diseases of stomach and duodenum: Secondary | ICD-10-CM | POA: Diagnosis not present

## 2020-08-09 DIAGNOSIS — Z1211 Encounter for screening for malignant neoplasm of colon: Secondary | ICD-10-CM | POA: Diagnosis not present

## 2020-08-09 DIAGNOSIS — R634 Abnormal weight loss: Secondary | ICD-10-CM | POA: Diagnosis not present

## 2020-08-09 DIAGNOSIS — K5730 Diverticulosis of large intestine without perforation or abscess without bleeding: Secondary | ICD-10-CM | POA: Diagnosis not present

## 2020-08-09 DIAGNOSIS — Z800 Family history of malignant neoplasm of digestive organs: Secondary | ICD-10-CM | POA: Diagnosis not present

## 2020-08-09 DIAGNOSIS — K648 Other hemorrhoids: Secondary | ICD-10-CM | POA: Diagnosis not present

## 2020-08-09 DIAGNOSIS — K635 Polyp of colon: Secondary | ICD-10-CM | POA: Diagnosis not present

## 2020-08-09 DIAGNOSIS — I10 Essential (primary) hypertension: Secondary | ICD-10-CM | POA: Diagnosis not present

## 2020-08-09 DIAGNOSIS — E119 Type 2 diabetes mellitus without complications: Secondary | ICD-10-CM | POA: Diagnosis not present

## 2020-08-09 DIAGNOSIS — D126 Benign neoplasm of colon, unspecified: Secondary | ICD-10-CM | POA: Diagnosis not present

## 2020-08-09 DIAGNOSIS — K644 Residual hemorrhoidal skin tags: Secondary | ICD-10-CM | POA: Diagnosis not present

## 2020-08-09 DIAGNOSIS — K449 Diaphragmatic hernia without obstruction or gangrene: Secondary | ICD-10-CM | POA: Diagnosis not present

## 2020-08-09 DIAGNOSIS — K219 Gastro-esophageal reflux disease without esophagitis: Secondary | ICD-10-CM | POA: Diagnosis not present

## 2020-08-15 ENCOUNTER — Other Ambulatory Visit: Payer: Self-pay | Admitting: Cardiology

## 2020-08-15 NOTE — Telephone Encounter (Signed)
Rx refill sent to pharmacy. 

## 2020-08-20 DIAGNOSIS — I6529 Occlusion and stenosis of unspecified carotid artery: Secondary | ICD-10-CM | POA: Insufficient documentation

## 2020-08-20 DIAGNOSIS — K219 Gastro-esophageal reflux disease without esophagitis: Secondary | ICD-10-CM | POA: Insufficient documentation

## 2020-08-20 DIAGNOSIS — G4700 Insomnia, unspecified: Secondary | ICD-10-CM | POA: Insufficient documentation

## 2020-08-20 DIAGNOSIS — T8859XA Other complications of anesthesia, initial encounter: Secondary | ICD-10-CM | POA: Insufficient documentation

## 2020-08-20 DIAGNOSIS — R519 Headache, unspecified: Secondary | ICD-10-CM | POA: Insufficient documentation

## 2020-08-20 DIAGNOSIS — E119 Type 2 diabetes mellitus without complications: Secondary | ICD-10-CM | POA: Insufficient documentation

## 2020-08-20 DIAGNOSIS — M1990 Unspecified osteoarthritis, unspecified site: Secondary | ICD-10-CM | POA: Insufficient documentation

## 2020-08-20 DIAGNOSIS — I499 Cardiac arrhythmia, unspecified: Secondary | ICD-10-CM | POA: Insufficient documentation

## 2020-08-20 DIAGNOSIS — R112 Nausea with vomiting, unspecified: Secondary | ICD-10-CM | POA: Insufficient documentation

## 2020-08-20 DIAGNOSIS — I2510 Atherosclerotic heart disease of native coronary artery without angina pectoris: Secondary | ICD-10-CM | POA: Insufficient documentation

## 2020-08-20 DIAGNOSIS — J189 Pneumonia, unspecified organism: Secondary | ICD-10-CM | POA: Insufficient documentation

## 2020-08-20 DIAGNOSIS — Z9889 Other specified postprocedural states: Secondary | ICD-10-CM | POA: Insufficient documentation

## 2020-08-20 DIAGNOSIS — Z8659 Personal history of other mental and behavioral disorders: Secondary | ICD-10-CM | POA: Insufficient documentation

## 2020-08-26 ENCOUNTER — Other Ambulatory Visit (HOSPITAL_COMMUNITY)
Admission: RE | Admit: 2020-08-26 | Discharge: 2020-08-26 | Disposition: A | Discharge status: Home / Self Care | Payer: PPO | Source: Ambulatory Visit | Attending: Neurosurgery | Admitting: Neurosurgery

## 2020-08-26 DIAGNOSIS — T84226A Displacement of internal fixation device of vertebrae, initial encounter: Secondary | ICD-10-CM | POA: Diagnosis present

## 2020-08-26 DIAGNOSIS — E119 Type 2 diabetes mellitus without complications: Secondary | ICD-10-CM | POA: Diagnosis present

## 2020-08-26 DIAGNOSIS — M5414 Radiculopathy, thoracic region: Secondary | ICD-10-CM | POA: Diagnosis present

## 2020-08-26 DIAGNOSIS — F429 Obsessive-compulsive disorder, unspecified: Secondary | ICD-10-CM | POA: Diagnosis present

## 2020-08-26 DIAGNOSIS — Z8701 Personal history of pneumonia (recurrent): Secondary | ICD-10-CM | POA: Diagnosis not present

## 2020-08-26 DIAGNOSIS — Z885 Allergy status to narcotic agent status: Secondary | ICD-10-CM | POA: Diagnosis not present

## 2020-08-26 DIAGNOSIS — M960 Pseudarthrosis after fusion or arthrodesis: Secondary | ICD-10-CM | POA: Diagnosis present

## 2020-08-26 DIAGNOSIS — Y838 Other surgical procedures as the cause of abnormal reaction of the patient, or of later complication, without mention of misadventure at the time of the procedure: Secondary | ICD-10-CM | POA: Diagnosis present

## 2020-08-26 DIAGNOSIS — K219 Gastro-esophageal reflux disease without esophagitis: Secondary | ICD-10-CM | POA: Diagnosis present

## 2020-08-26 DIAGNOSIS — Z87891 Personal history of nicotine dependence: Secondary | ICD-10-CM | POA: Diagnosis not present

## 2020-08-26 DIAGNOSIS — E782 Mixed hyperlipidemia: Secondary | ICD-10-CM | POA: Diagnosis present

## 2020-08-26 DIAGNOSIS — Z91048 Other nonmedicinal substance allergy status: Secondary | ICD-10-CM | POA: Diagnosis not present

## 2020-08-26 DIAGNOSIS — Z01812 Encounter for preprocedural laboratory examination: Secondary | ICD-10-CM | POA: Insufficient documentation

## 2020-08-26 DIAGNOSIS — Z7984 Long term (current) use of oral hypoglycemic drugs: Secondary | ICD-10-CM | POA: Diagnosis not present

## 2020-08-26 DIAGNOSIS — M1990 Unspecified osteoarthritis, unspecified site: Secondary | ICD-10-CM | POA: Diagnosis present

## 2020-08-26 DIAGNOSIS — I2510 Atherosclerotic heart disease of native coronary artery without angina pectoris: Secondary | ICD-10-CM | POA: Diagnosis present

## 2020-08-26 DIAGNOSIS — Z20822 Contact with and (suspected) exposure to covid-19: Secondary | ICD-10-CM | POA: Insufficient documentation

## 2020-08-26 DIAGNOSIS — Z888 Allergy status to other drugs, medicaments and biological substances status: Secondary | ICD-10-CM | POA: Diagnosis not present

## 2020-08-26 DIAGNOSIS — Z7982 Long term (current) use of aspirin: Secondary | ICD-10-CM | POA: Diagnosis not present

## 2020-08-26 DIAGNOSIS — Z9851 Tubal ligation status: Secondary | ICD-10-CM | POA: Diagnosis not present

## 2020-08-26 DIAGNOSIS — M48062 Spinal stenosis, lumbar region with neurogenic claudication: Secondary | ICD-10-CM | POA: Diagnosis not present

## 2020-08-26 DIAGNOSIS — I10 Essential (primary) hypertension: Secondary | ICD-10-CM | POA: Diagnosis present

## 2020-08-26 DIAGNOSIS — K7581 Nonalcoholic steatohepatitis (NASH): Secondary | ICD-10-CM | POA: Diagnosis present

## 2020-08-26 DIAGNOSIS — Z79899 Other long term (current) drug therapy: Secondary | ICD-10-CM | POA: Diagnosis not present

## 2020-08-26 DIAGNOSIS — G4700 Insomnia, unspecified: Secondary | ICD-10-CM | POA: Diagnosis present

## 2020-08-26 DIAGNOSIS — Z8249 Family history of ischemic heart disease and other diseases of the circulatory system: Secondary | ICD-10-CM | POA: Diagnosis not present

## 2020-08-26 LAB — SARS CORONAVIRUS 2 (TAT 6-24 HRS): SARS Coronavirus 2: NEGATIVE

## 2020-08-27 NOTE — Progress Notes (Unsigned)
Cardiology Office Note:    Date:  08/28/2020   ID:  Elaine Roach, DOB 06-Jan-1951, MRN [id number]  PCP:  Algis Greenhouse, MD  Cardiologist:  Shirlee More, MD    Referring MD: Algis Greenhouse, MD    ASSESSMENT:    1. Mild CAD   2. Essential hypertension   3. Primary hypertension   4. Mixed hyperlipidemia    PLAN:    In order of problems listed above:  1. She continues to do well having no anginal discomfort on current medical therapy she will continue long-term aspirin presently off with her pending back surgery combined lipid-lowering therapy Lipitor and Zetia with ideal LDL and her rate limiting calcium channel blocker along with ranolazine.   Next appointment: 1 year   Medication Adjustments/Labs and Tests Ordered: Current medicines are reviewed at length with the patient today.  Concerns regarding medicines are outlined above.  Orders Placed This Encounter  Procedures  . EKG 12-Lead   No orders of the defined types were placed in this encounter.   No chief complaint on file.   History of Present Illness:    Elaine Roach is a 70 y.o. female with a hx of mild CAD with angina,orthostatic hypotension DM2, HTN, and HLD last seen 02/27/2020. Compliance with diet, lifestyle and medications: Yes  Recent labs [facility] PCP 07/04/2020 CMP potassium 5.0 creatinine 0.83 GFR 76 cc otherwise normal cholesterol 86 LDL 25 HDL cholesterol 47  She is pending back surgery tomorrow Dr. Donnamarie Roach [facility]  She has intermittent palpitation not severe sustained no angina dyspnea or syncope.  Her EKG in my office today is stable showing sinus rhythm incomplete right bundle branch block otherwise normal.  I gave her a copy to hand carry with her Past Medical History:  Diagnosis Date  . Arthritis   . Carotid artery calcification   . Complication of anesthesia    pt. woke up during hand surgery and colonoscopy, cataract surgery  . Coronary artery disease    . Diabetes mellitus without complication ([place])    type 2    dx 10 yrs ago  . Dysrhythmia    BENIGN PVC'S  . GERD (gastroesophageal reflux disease)   . Headache   . History of OCD (obsessive compulsive disorder)    TO A DEGREE  . Hypertension   . Insomnia   . Mixed hyperlipidemia   . Nonalcoholic steatohepatitis (NASH)    DX IN 1995  . Osteoarthritis   . Pneumonia    walking pneu. in the past  . PONV (postoperative nausea and vomiting)     Past Surgical History:  Procedure Laterality Date  . ANTERIOR CERVICAL DECOMP/DISCECTOMY FUSION  07/19/2017   Procedure: ANTERIOR CERVICAL DECOMPRESSION/DISCECTOMY FUSION , INTERBODY PROSTHESIS, ANTERIOR PLATE CERVICAL SIX- CERVICAL SEVEN, CERVICAL SEVEN- THORACIC ONE, EXPLORE OLD FUSION;  Surgeon: Newman Pies, MD;  Location: [place];  Service: Neurosurgery;;  . BACK SURGERY     x3  . BUNIONECTOMY     RIGHT  . CARDIAC CATHETERIZATION    . CARPAL TUNNEL RELEASE     RIGHT  . CHOLECYSTECTOMY    . GANGLION CYST EXCISION Right   . LEFT HEART CATH AND CORONARY ANGIOGRAPHY N/A 04/27/2018   Procedure: LEFT HEART CATH AND CORONARY ANGIOGRAPHY;  Surgeon: Leonie Man, MD;  Location: [place] CV LAB;  Service: Cardiovascular;  Laterality: N/A;  . MYELOGRAM    . SHOULDER ARTHROSCOPY W/ ROTATOR CUFF REPAIR     4  SCOPES.Marland KitchenMarland KitchenALL ON THE RIGHT  . TUBAL LIGATION    . WOUND EXPLORATION  03/19/2015   CERVICAL WOUND   . WOUND EXPLORATION N/A 03/19/2015   Procedure: CERVICAL WOUND EXPLORATION, EVACUATION OF CERVICAL HEMATOMA;  Surgeon: Newman Pies, MD;  Location: [place] [facility];  Service: Neurosurgery;  Laterality: N/A;  cervical wound exploration, evacuation of cervical hematoma    Current Medications: Current Meds  Medication Sig  . atorvastatin (LIPITOR) 80 MG tablet Take 80 mg by mouth every evening.   . cetirizine (ZYRTEC) 10 MG tablet Take 10 mg by mouth daily as needed for allergies.  . cyclobenzaprine (FLEXERIL) 10 MG tablet Take 1 tablet  (10 mg total) by mouth 3 (three) times daily as needed for muscle spasms.  Marland Kitchen diltiazem (CARDIZEM CD) 240 MG 24 hr capsule TAKE 1 CAPSULE BY MOUTH DAILY (Patient taking differently: Take 240 mg by mouth daily.)  . docusate sodium (COLACE) 100 MG capsule Take 1 capsule (100 mg total) by mouth 2 (two) times daily. (Patient taking differently: Take 100 mg by mouth daily as needed for moderate constipation.)  . ezetimibe (ZETIA) 10 MG tablet Take 10 mg by mouth every evening.   Marland Kitchen glucose blood test strip 1 strip by Misc.(Non-Drug; Combo Route) route daily.  . isosorbide mononitrate (IMDUR) 60 MG 24 hr tablet TAKE 1 TABLET(60 MG) BY MOUTH DAILY (Patient taking differently: Take 60 mg by mouth daily.)  . losartan (COZAAR) 50 MG tablet Take 50 mg by mouth daily as needed (Blood Pressure is greater than 160 on top).  . metFORMIN (GLUCOPHAGE) 500 MG tablet Take 500 mg by mouth 2 (two) times daily with a meal.  . nitroGLYCERIN (NITROSTAT) 0.4 MG SL tablet Place 0.4 mg under the tongue every 5 (five) minutes as needed for chest pain.  Marland Kitchen omeprazole (PRILOSEC) 40 MG capsule Take 40 mg by mouth every morning.   . ranolazine (RANEXA) 500 MG 12 hr tablet TAKE 1 TABLET(500 MG) BY MOUTH TWICE DAILY (Patient taking differently: Take 500 mg by mouth 2 (two) times daily.)  . sertraline (ZOLOFT) 100 MG tablet Take 100 mg by mouth at bedtime.  Marland Kitchen zolpidem (AMBIEN) 10 MG tablet Take 10 mg by mouth at bedtime as needed.     Allergies:   Codeine, Lisinopril, and Tape   Social History   Socioeconomic History  . Marital status: Divorced    Spouse name: Not on file  . Number of children: Not on file  . Years of education: Not on file  . Highest education level: Not on file  Occupational History  . Not on file  Tobacco Use  . Smoking status: Former Smoker    Packs/day: 2.00    Years: 17.00    Pack years: 34.00    Types: Cigarettes  . Smokeless tobacco: Never Used  . Tobacco comment: quit when she was 35  Vaping  Use  . Vaping Use: Never used  Substance and Sexual Activity  . Alcohol use: Yes    Comment: OCCASIONAL  WINE  . Drug use: No  . Sexual activity: Not on file  Other Topics Concern  . Not on file  Social History Narrative  . Not on file   Social Determinants of Health   Financial Resource Strain: Not on file  Food Insecurity: Not on file  Transportation Needs: Not on file  Physical Activity: Not on file  Stress: Not on file  Social Connections: Not on file     Family History: The patient's family history  includes Breast cancer in her mother; Heart Problems in her father; Heart failure in her paternal grandfather and paternal grandmother. ROS:   Please see the history of present illness.    All other systems reviewed and are negative.  EKGs/Labs/Other Studies Reviewed:    The following studies were reviewed today:  EKG:  EKG ordered today and personally reviewed.  The ekg ordered today demonstrates sinus rhythm incomplete right bundle branch block  Recent Labs: 03/21/2020: ALT 17 03/26/2020: BUN 16; Creatinine, Ser 1.05; Hemoglobin 8.8; Platelets 218; Potassium 4.2; Sodium 142  Recent Lipid Panel No results found for: CHOL, TRIG, HDL, CHOLHDL, VLDL, LDLCALC, LDLDIRECT  Physical Exam:    VS:  BP 134/68   Pulse 73   Ht 5' 4"  (1.626 m)   Wt 154 lb 3.2 oz (69.9 kg)   SpO2 97%   BMI 26.47 kg/m     Wt Readings from Last 3 Encounters:  08/28/20 154 lb 3.2 oz (69.9 kg)  03/25/20 175 lb 3.2 oz (79.5 kg)  03/21/20 175 lb 3.2 oz (79.5 kg)     GEN:  Well nourished, well developed in no acute distress HEENT: Normal NECK: No JVD; No carotid bruits LYMPHATICS: No lymphadenopathy CARDIAC: RRR, no murmurs, rubs, gallops RESPIRATORY:  Clear to auscultation without rales, wheezing or rhonchi  ABDOMEN: Soft, non-tender, non-distended MUSCULOSKELETAL:  No edema; No deformity  SKIN: Warm and dry NEUROLOGIC:  Alert and oriented x 3 PSYCHIATRIC:  Normal affect     Signed, Shirlee More, MD  08/28/2020 10:45 AM    [place]

## 2020-08-28 ENCOUNTER — Encounter (HOSPITAL_COMMUNITY): Payer: Self-pay | Admitting: Neurosurgery

## 2020-08-28 ENCOUNTER — Ambulatory Visit: Payer: PPO | Admitting: Cardiology

## 2020-08-28 ENCOUNTER — Other Ambulatory Visit: Payer: Self-pay

## 2020-08-28 ENCOUNTER — Encounter: Payer: Self-pay | Admitting: Cardiology

## 2020-08-28 VITALS — BP 134/68 | HR 73 | Ht 64.0 in | Wt 154.2 lb

## 2020-08-28 DIAGNOSIS — I10 Essential (primary) hypertension: Secondary | ICD-10-CM

## 2020-08-28 DIAGNOSIS — I2510 Atherosclerotic heart disease of native coronary artery without angina pectoris: Secondary | ICD-10-CM | POA: Diagnosis not present

## 2020-08-28 DIAGNOSIS — E782 Mixed hyperlipidemia: Secondary | ICD-10-CM

## 2020-08-28 MED ORDER — EZETIMIBE 10 MG PO TABS: 10.0000 mg | ORAL_TABLET | Freq: Every evening | ORAL | 3 refills | Status: DC

## 2020-08-28 MED ORDER — RANOLAZINE ER 500 MG PO TB12: 500.0000 mg | ORAL_TABLET | Freq: Two times a day (BID) | ORAL | 3 refills | Status: DC

## 2020-08-28 MED ORDER — ISOSORBIDE MONONITRATE ER 60 MG PO TB24: 60.0000 mg | ORAL_TABLET | Freq: Every day | ORAL | 3 refills | Status: DC

## 2020-08-28 MED ORDER — DILTIAZEM HCL ER COATED BEADS 240 MG PO CP24: 240.0000 mg | ORAL_CAPSULE | Freq: Every day | ORAL | 3 refills | Status: DC

## 2020-08-28 MED ORDER — ATORVASTATIN CALCIUM 80 MG PO TABS: 80.0000 mg | ORAL_TABLET | Freq: Every evening | ORAL | 3 refills | Status: DC

## 2020-08-28 NOTE — Progress Notes (Signed)
Anesthesia Chart Review:  Pt is a same day work up   Case: [id number] Date/Time: 08/29/20 0716   Procedure: EXPLORE THORACIC FUSION, REMOVAL T10 INSTRUMENTATION (N/A ) - 3C   Anesthesia type: General   Pre-op diagnosis: PSEUDOARTHROSIS FOLLOWING SPINAL FUSION   Location: [place] OR ROOM 15 / [facility]   Surgeons: Newman Pies, MD      DISCUSSION:  Pt is 70 years old with hx CAD (diffuse mild-moderate by 2019 cath), HTN, DM, NASH   PROVIDERS: - PCP is Dough, Jaymes Graff, MD - Cardiologist is Shirlee More, MD. Last office visit 08/28/20. He is aware of upcoming surgery.    LABS: Will be obtained day of surgery    IMAGES: CT coronary FFR 04/21/18:  1. Stenosis in mid-distal LCx appears to be hemodynamically significant. 2. Disease in RCA and LAD does not appear to be hemodynamically significant.  CT coronary morphology 04/21/18:  1. Coronary artery calcium score 598 Agatston units. This places the patient in the 96th percentile for age and gender, suggesting high risk for future cardiac events. 2. Extensive plaque though all areas look to be < 50% stenosis. Will send for FFR to confirm.   EKG 01/11/20: NSR. Incomplete RBBB   CV: Carotid duplex 01/30/20:  - Right Carotid: There is no evidence of stenosis in the right ICA.  - Left Carotid: Velocities in the left ICA are consistent with a 40-59%  stenosis.  - Vertebrals: Bilateral vertebral arteries demonstrate antegrade flow.  - Subclavians: Normal flow hemodynamics were seen in bilateral subclavian arteries.   Long term cardiac event monitor 01/12/20:  - ZIO monitor was performed for 4 days beginning 01/11/2020 in order to assess palpitation. - The cardiac rhythm throughout was sinus with minimum average and maximum heart rates of 60, 78 and 126 bpm. - There were no pauses of 3 seconds or greater and no episodes of second or third-degree AV nodal block or sinus node exit block. - There were 6 triggered events 1 was associated with rare  APC 2 were associated with rare PVCs. - Ventricular ectopy was rare with isolated PVCs - Supraventricular ectopy is rare without episodes of atrial fibrillation or flutter. - Conclusion unremarkable event monitor however 3 the triggered events were associated with isolated PVCs or APCs.  Cardiac cath 04/27/18:   There is hyperdynamic left ventricular systolic function.  LV end diastolic pressure is normal.  The left ventricular ejection fraction is greater than 65% by visual estimate.  There is no aortic valve stenosis.  Mid Cx lesion is 45% stenosed.  Ost Cx to Prox Cx lesion is 20% stenosed.  Prox LAD to Mid LAD lesion is 40% stenosed.  Mid RCA lesion is 65% stenosed.  Ost LM to Prox LAD lesion is 5% stenosed.    Past Medical History:  Diagnosis Date  . Arthritis   . Carotid artery calcification   . Complication of anesthesia    pt. woke up during hand surgery and colonoscopy, cataract surgery  . Coronary artery disease   . Diabetes mellitus without complication ([place])    type 2    dx 10 yrs ago  . Dysrhythmia    BENIGN PVC'S  . GERD (gastroesophageal reflux disease)   . Headache   . History of OCD (obsessive compulsive disorder)    TO A DEGREE  . Hypertension   . Insomnia   . Mixed hyperlipidemia   . Nonalcoholic steatohepatitis ([facility])    DX IN 1995  . Osteoarthritis   .  Pneumonia    walking pneu. in the past  . PONV (postoperative nausea and vomiting)     Past Surgical History:  Procedure Laterality Date  . ANTERIOR CERVICAL DECOMP/DISCECTOMY FUSION  07/19/2017   Procedure: ANTERIOR CERVICAL DECOMPRESSION/DISCECTOMY FUSION , INTERBODY PROSTHESIS, ANTERIOR PLATE CERVICAL SIX- CERVICAL SEVEN, CERVICAL SEVEN- THORACIC ONE, EXPLORE OLD FUSION;  Surgeon: Newman Pies, MD;  Location: [place];  Service: Neurosurgery;;  . BACK SURGERY     x3  . BUNIONECTOMY     RIGHT  . CARDIAC CATHETERIZATION    . CARPAL TUNNEL RELEASE     RIGHT  . CHOLECYSTECTOMY    .  GANGLION CYST EXCISION Right   . LEFT HEART CATH AND CORONARY ANGIOGRAPHY N/A 04/27/2018   Procedure: LEFT HEART CATH AND CORONARY ANGIOGRAPHY;  Surgeon: Leonie Man, MD;  Location: [place] CV LAB;  Service: Cardiovascular;  Laterality: N/A;  . MYELOGRAM    . SHOULDER ARTHROSCOPY W/ ROTATOR CUFF REPAIR     4 SCOPES.Marland KitchenMarland KitchenALL ON THE RIGHT  . TUBAL LIGATION    . WOUND EXPLORATION  03/19/2015   CERVICAL WOUND   . WOUND EXPLORATION N/A 03/19/2015   Procedure: CERVICAL WOUND EXPLORATION, EVACUATION OF CERVICAL HEMATOMA;  Surgeon: Newman Pies, MD;  Location: [place] [facility];  Service: Neurosurgery;  Laterality: N/A;  cervical wound exploration, evacuation of cervical hematoma    MEDICATIONS: No current facility-administered medications for this encounter.   Marland Kitchen aspirin 81 MG EC tablet  . cetirizine (ZYRTEC) 10 MG tablet  . Cholecalciferol (VITAMIN D3) 5000 units CAPS  . cyclobenzaprine (FLEXERIL) 10 MG tablet  . docusate sodium (COLACE) 100 MG capsule  . losartan (COZAAR) 50 MG tablet  . metFORMIN (GLUCOPHAGE) 500 MG tablet  . Multiple Vitamins-Minerals (MULTIVITAMIN WITH MINERALS) tablet  . nitroGLYCERIN (NITROSTAT) 0.4 MG SL tablet  . omeprazole (PRILOSEC) 40 MG capsule  . sertraline (ZOLOFT) 100 MG tablet  . atorvastatin (LIPITOR) 80 MG tablet  . diltiazem (CARDIZEM CD) 240 MG 24 hr capsule  . ezetimibe (ZETIA) 10 MG tablet  . glucose blood test strip  . isosorbide mononitrate (IMDUR) 60 MG 24 hr tablet  . ranolazine (RANEXA) 500 MG 12 hr tablet  . zolpidem (AMBIEN) 10 MG tablet    If labs acceptable day of surgery, I anticipate pt can proceed with surgery as scheduled.  Willeen Cass, PhD, FNP-BC [facility] Short Stay [facility]/Anesthesiology Phone: [phone] 08/28/2020 2:40 PM

## 2020-08-28 NOTE — Patient Instructions (Signed)

## 2020-08-28 NOTE — Anesthesia Preprocedure Evaluation (Addendum)
Anesthesia Evaluation  Patient identified by MRN, date of birth, ID band Patient awake    Reviewed: Allergy & Precautions, NPO status , Patient's Chart, lab work & pertinent test results  History of Anesthesia Complications (+) PONV and history of anesthetic complications  Airway Mallampati: II  TM Distance: >3 FB Neck ROM: Full    Dental  (+) Dental Advisory Given, Teeth Intact, Partial Lower   Pulmonary pneumonia, former smoker,    Pulmonary exam normal breath sounds clear to auscultation       Cardiovascular hypertension, Pt. on medications + angina + CAD  Normal cardiovascular exam+ dysrhythmias  Rhythm:Regular Rate:Normal     Neuro/Psych  Headaches, PSYCHIATRIC DISORDERS Anxiety  Neuromuscular disease    GI/Hepatic GERD  ,(+) Hepatitis -  Endo/Other  negative endocrine ROSdiabetes  Renal/GU negative Renal ROS     Musculoskeletal  (+) Arthritis ,   Abdominal   Peds  Hematology negative hematology ROS (+)   Anesthesia Other Findings   Reproductive/Obstetrics                           Anesthesia Physical Anesthesia Plan  ASA: III  Anesthesia Plan: General   Post-op Pain Management:    Induction: Intravenous  PONV Risk Score and Plan: 4 or greater and Ondansetron, Dexamethasone, Midazolam, Diphenhydramine, Treatment may vary due to age or medical condition and Propofol infusion  Airway Management Planned: Oral ETT  Additional Equipment:   Intra-op Plan:   Post-operative Plan: Extubation in OR  Informed Consent: I have reviewed the patients History and Physical, chart, labs and discussed the procedure including the risks, benefits and alternatives for the proposed anesthesia with the patient or authorized representative who has indicated his/her understanding and acceptance.     Dental advisory given  Plan Discussed with: CRNA  Anesthesia Plan Comments: (2x PIV   See  APP note by Durel Salts, FNP )      Anesthesia Quick Evaluation

## 2020-08-28 NOTE — Progress Notes (Signed)
Patient denies shortness of breath, fever, cough or chest pain.  PCP - Dr Teressa Lower Cardiologist - Dr Shirlee More  Chest x-ray - n/a EKG - 08/28/20 Stress Test - n/a ECHO - n/a Cardiac Cath - 04/27/18  . Do not take metformin on the morning of surgery.  . If your blood sugar is less than 70 mg/dL, you will need to treat for low blood sugar: o Treat a low blood sugar (less than 70 mg/dL) with  cup of clear juice (cranberry or apple), 4 glucose tablets, OR glucose gel. o Recheck blood sugar in 15 minutes after treatment (to make sure it is greater than 70 mg/dL). If your blood sugar is not greater than 70 mg/dL on recheck, call [phone] for further instructions.  Aspirin Instructions:  Last dose was on 08/28/20 per patient.  Anesthesia review: Yes  STOP now taking any Aspirin (unless otherwise instructed by your surgeon), Aleve, Naproxen, Ibuprofen, Motrin, Advil, Goody's, BC's, all herbal medications, fish oil, and all vitamins.   Coronavirus Screening Covid test on 08/26/20 was negative.  Patient verbalized understanding of instructions that were given via phone.

## 2020-08-29 ENCOUNTER — Other Ambulatory Visit: Payer: Self-pay | Admitting: Cardiology

## 2020-08-29 ENCOUNTER — Encounter (HOSPITAL_COMMUNITY): Payer: Self-pay | Admitting: Neurosurgery

## 2020-08-29 ENCOUNTER — Inpatient Hospital Stay (HOSPITAL_COMMUNITY): Payer: PPO | Admitting: Certified Registered"

## 2020-08-29 ENCOUNTER — Inpatient Hospital Stay (HOSPITAL_COMMUNITY)
Admission: RE | Disposition: A | Discharge status: Home / Self Care | Payer: Self-pay | Source: Home / Self Care | Attending: Neurosurgery

## 2020-08-29 ENCOUNTER — Inpatient Hospital Stay (HOSPITAL_COMMUNITY)
Admission: RE | Admit: 2020-08-29 | Discharge: 2020-08-30 | DRG: 496 | Disposition: A | Discharge status: Home / Self Care | Payer: PPO | Attending: Neurosurgery | Admitting: Neurosurgery

## 2020-08-29 DIAGNOSIS — I10 Essential (primary) hypertension: Secondary | ICD-10-CM | POA: Diagnosis present

## 2020-08-29 DIAGNOSIS — I2510 Atherosclerotic heart disease of native coronary artery without angina pectoris: Secondary | ICD-10-CM | POA: Diagnosis present

## 2020-08-29 DIAGNOSIS — Y838 Other surgical procedures as the cause of abnormal reaction of the patient, or of later complication, without mention of misadventure at the time of the procedure: Secondary | ICD-10-CM | POA: Diagnosis present

## 2020-08-29 DIAGNOSIS — Z79899 Other long term (current) drug therapy: Secondary | ICD-10-CM

## 2020-08-29 DIAGNOSIS — K7581 Nonalcoholic steatohepatitis (NASH): Secondary | ICD-10-CM | POA: Diagnosis present

## 2020-08-29 DIAGNOSIS — Z7982 Long term (current) use of aspirin: Secondary | ICD-10-CM | POA: Diagnosis not present

## 2020-08-29 DIAGNOSIS — Z7984 Long term (current) use of oral hypoglycemic drugs: Secondary | ICD-10-CM | POA: Diagnosis not present

## 2020-08-29 DIAGNOSIS — E119 Type 2 diabetes mellitus without complications: Secondary | ICD-10-CM | POA: Diagnosis present

## 2020-08-29 DIAGNOSIS — M5414 Radiculopathy, thoracic region: Secondary | ICD-10-CM | POA: Diagnosis present

## 2020-08-29 DIAGNOSIS — Z9851 Tubal ligation status: Secondary | ICD-10-CM

## 2020-08-29 DIAGNOSIS — G4700 Insomnia, unspecified: Secondary | ICD-10-CM | POA: Diagnosis present

## 2020-08-29 DIAGNOSIS — M960 Pseudarthrosis after fusion or arthrodesis: Secondary | ICD-10-CM | POA: Diagnosis present

## 2020-08-29 DIAGNOSIS — S32009K Unspecified fracture of unspecified lumbar vertebra, subsequent encounter for fracture with nonunion: Secondary | ICD-10-CM | POA: Diagnosis present

## 2020-08-29 DIAGNOSIS — F429 Obsessive-compulsive disorder, unspecified: Secondary | ICD-10-CM | POA: Diagnosis present

## 2020-08-29 DIAGNOSIS — Z20822 Contact with and (suspected) exposure to covid-19: Secondary | ICD-10-CM | POA: Diagnosis present

## 2020-08-29 DIAGNOSIS — Z91048 Other nonmedicinal substance allergy status: Secondary | ICD-10-CM

## 2020-08-29 DIAGNOSIS — Z888 Allergy status to other drugs, medicaments and biological substances status: Secondary | ICD-10-CM | POA: Diagnosis not present

## 2020-08-29 DIAGNOSIS — Z8249 Family history of ischemic heart disease and other diseases of the circulatory system: Secondary | ICD-10-CM | POA: Diagnosis not present

## 2020-08-29 DIAGNOSIS — M1990 Unspecified osteoarthritis, unspecified site: Secondary | ICD-10-CM | POA: Diagnosis present

## 2020-08-29 DIAGNOSIS — Z885 Allergy status to narcotic agent status: Secondary | ICD-10-CM

## 2020-08-29 DIAGNOSIS — K219 Gastro-esophageal reflux disease without esophagitis: Secondary | ICD-10-CM | POA: Diagnosis present

## 2020-08-29 DIAGNOSIS — Z8701 Personal history of pneumonia (recurrent): Secondary | ICD-10-CM

## 2020-08-29 DIAGNOSIS — Z87891 Personal history of nicotine dependence: Secondary | ICD-10-CM

## 2020-08-29 DIAGNOSIS — T84226A Displacement of internal fixation device of vertebrae, initial encounter: Secondary | ICD-10-CM | POA: Diagnosis present

## 2020-08-29 DIAGNOSIS — E782 Mixed hyperlipidemia: Secondary | ICD-10-CM | POA: Diagnosis present

## 2020-08-29 LAB — CBC
HCT: 31.3 % — ABNORMAL LOW (ref 36.0–46.0)
Hemoglobin: 9.6 g/dL — ABNORMAL LOW (ref 12.0–15.0)
MCH: 23.5 pg — ABNORMAL LOW (ref 26.0–34.0)
MCHC: 30.7 g/dL (ref 30.0–36.0)
MCV: 76.5 fL — ABNORMAL LOW (ref 80.0–100.0)
Platelets: 266 10*3/uL (ref 150–400)
RBC: 4.09 MIL/uL (ref 3.87–5.11)
RDW: 18.1 % — ABNORMAL HIGH (ref 11.5–15.5)
WBC: 5.6 10*3/uL (ref 4.0–10.5)
nRBC: 0 % (ref 0.0–0.2)

## 2020-08-29 LAB — GLUCOSE, CAPILLARY
Glucose-Capillary: 130 mg/dL — ABNORMAL HIGH (ref 70–99)
Glucose-Capillary: 145 mg/dL — ABNORMAL HIGH (ref 70–99)
Glucose-Capillary: 329 mg/dL — ABNORMAL HIGH (ref 70–99)
Glucose-Capillary: 169 mg/dL — ABNORMAL HIGH (ref 70–99)

## 2020-08-29 LAB — BASIC METABOLIC PANEL
Anion gap: 9 (ref 5–15)
BUN: 19 mg/dL (ref 8–23)
CO2: 28 mmol/L (ref 22–32)
Calcium: 9.5 mg/dL (ref 8.9–10.3)
Chloride: 104 mmol/L (ref 98–111)
Creatinine, Ser: 0.95 mg/dL (ref 0.44–1.00)
GFR, Estimated: >60 mL/min (ref >60)
Glucose, Bld: 125 mg/dL — ABNORMAL HIGH (ref 70–99)
Potassium: 3.9 mmol/L (ref 3.5–5.1)
Sodium: 141 mmol/L (ref 135–145)

## 2020-08-29 LAB — TYPE AND SCREEN
ABO/RH(D): A POS
Antibody Screen: NEGATIVE

## 2020-08-29 LAB — HEMOGLOBIN A1C
Hgb A1c MFr Bld: 6.4 % — ABNORMAL HIGH (ref 4.8–5.6)
Mean Plasma Glucose: 136.98 mg/dL

## 2020-08-29 LAB — SURGICAL PCR SCREEN
MRSA, PCR: NEGATIVE
Staphylococcus aureus: NEGATIVE

## 2020-08-29 SURGERY — POSTERIOR LUMBAR FUSION 1 WITH HARDWARE REMOVAL
Anesthesia: General

## 2020-08-29 MED ORDER — PROPOFOL 10 MG/ML IV BOLUS
INTRAVENOUS | Status: AC
  Filled 2020-08-29: qty 20

## 2020-08-29 MED ORDER — HYDROMORPHONE HCL 1 MG/ML IJ SOLN: 0.2500 mg | INTRAMUSCULAR | Status: DC | PRN

## 2020-08-29 MED ORDER — FENTANYL CITRATE (PF) 250 MCG/5ML IJ SOLN
INTRAMUSCULAR | Status: AC
  Filled 2020-08-29: qty 5

## 2020-08-29 MED ORDER — EZETIMIBE 10 MG PO TABS
10.0000 mg | ORAL_TABLET | Freq: Every evening | ORAL | Status: DC
  Administered 2020-08-29: 10 mg via ORAL
  Filled 2020-08-29 (×2): qty 1

## 2020-08-29 MED ORDER — EPHEDRINE SULFATE 50 MG/ML IJ SOLN
INTRAMUSCULAR | Status: DC | PRN
  Administered 2020-08-29 (×6): 10 mg via INTRAVENOUS

## 2020-08-29 MED ORDER — CEFAZOLIN SODIUM-DEXTROSE 2-4 GM/100ML-% IV SOLN
2.0000 g | INTRAVENOUS | Status: AC
  Administered 2020-08-29: 2 g via INTRAVENOUS
  Filled 2020-08-29: qty 100

## 2020-08-29 MED ORDER — SERTRALINE HCL 50 MG PO TABS
100.0000 mg | ORAL_TABLET | Freq: Every day | ORAL | Status: DC
  Administered 2020-08-29: 100 mg via ORAL
  Filled 2020-08-29: qty 2

## 2020-08-29 MED ORDER — PHENOL 1.4 % MT LIQD: 1.0000 | OROMUCOSAL | Status: DC | PRN

## 2020-08-29 MED ORDER — ACETAMINOPHEN 325 MG PO TABS
650.0000 mg | ORAL_TABLET | ORAL | Status: DC | PRN
Start: 2020-08-29 — End: 2020-08-30

## 2020-08-29 MED ORDER — ONDANSETRON HCL 4 MG/2ML IJ SOLN
INTRAMUSCULAR | Status: DC | PRN
  Administered 2020-08-29: 4 mg via INTRAVENOUS

## 2020-08-29 MED ORDER — RANOLAZINE ER 500 MG PO TB12
500.0000 mg | ORAL_TABLET | Freq: Two times a day (BID) | ORAL | Status: DC
  Administered 2020-08-29 – 2020-08-30 (×2): 500 mg via ORAL
  Filled 2020-08-29 (×3): qty 1

## 2020-08-29 MED ORDER — CHLORHEXIDINE GLUCONATE CLOTH 2 % EX PADS: 6.0000 | MEDICATED_PAD | Freq: Once | CUTANEOUS | Status: DC

## 2020-08-29 MED ORDER — CEFAZOLIN SODIUM-DEXTROSE 2-4 GM/100ML-% IV SOLN
2.0000 g | Freq: Three times a day (TID) | INTRAVENOUS | Status: AC
Start: 2020-08-29 — End: 2020-08-30
  Administered 2020-08-29 (×2): 2 g via INTRAVENOUS
  Filled 2020-08-29 (×2): qty 100

## 2020-08-29 MED ORDER — AMISULPRIDE (ANTIEMETIC) 5 MG/2ML IV SOLN: 10.0000 mg | Freq: Once | INTRAVENOUS | Status: DC | PRN

## 2020-08-29 MED ORDER — VITAMIN D 25 MCG (1000 UNIT) PO TABS
5000.0000 [IU] | ORAL_TABLET | Freq: Every day | ORAL | Status: DC
  Administered 2020-08-29 – 2020-08-30 (×2): 5000 [IU] via ORAL
  Filled 2020-08-29 (×2): qty 5

## 2020-08-29 MED ORDER — BACITRACIN ZINC 500 UNIT/GM EX OINT
TOPICAL_OINTMENT | CUTANEOUS | Status: DC | PRN
  Administered 2020-08-29: 1 via TOPICAL

## 2020-08-29 MED ORDER — BUPIVACAINE LIPOSOME 1.3 % IJ SUSP
INTRAMUSCULAR | Status: AC
  Filled 2020-08-29: qty 20

## 2020-08-29 MED ORDER — DEXAMETHASONE SODIUM PHOSPHATE 10 MG/ML IJ SOLN
INTRAMUSCULAR | Status: DC | PRN
  Administered 2020-08-29: 8 mg via INTRAVENOUS

## 2020-08-29 MED ORDER — HYDROMORPHONE HCL 2 MG PO TABS
2.0000 mg | ORAL_TABLET | ORAL | Status: DC | PRN
  Administered 2020-08-29 – 2020-08-30 (×3): 2 mg via ORAL
  Filled 2020-08-29 (×3): qty 1

## 2020-08-29 MED ORDER — ISOSORBIDE MONONITRATE ER 60 MG PO TB24
60.0000 mg | ORAL_TABLET | Freq: Every day | ORAL | Status: DC
  Administered 2020-08-30: 60 mg via ORAL
  Filled 2020-08-29: qty 1

## 2020-08-29 MED ORDER — ROCURONIUM BROMIDE 10 MG/ML (PF) SYRINGE
PREFILLED_SYRINGE | INTRAVENOUS | Status: DC | PRN
  Administered 2020-08-29: 50 mg via INTRAVENOUS

## 2020-08-29 MED ORDER — METFORMIN HCL 500 MG PO TABS
500.0000 mg | ORAL_TABLET | Freq: Two times a day (BID) | ORAL | Status: DC
  Administered 2020-08-29 – 2020-08-30 (×2): 500 mg via ORAL
  Filled 2020-08-29 (×2): qty 1

## 2020-08-29 MED ORDER — INSULIN ASPART 100 UNIT/ML ~~LOC~~ SOLN
0.0000 [IU] | Freq: Three times a day (TID) | SUBCUTANEOUS | Status: DC
  Administered 2020-08-29: 15 [IU] via SUBCUTANEOUS
  Administered 2020-08-29 – 2020-08-30 (×2): 3 [IU] via SUBCUTANEOUS

## 2020-08-29 MED ORDER — PROPOFOL 10 MG/ML IV BOLUS
INTRAVENOUS | Status: DC | PRN
  Administered 2020-08-29: 110 mg via INTRAVENOUS

## 2020-08-29 MED ORDER — PROPOFOL 500 MG/50ML IV EMUL
INTRAVENOUS | Status: DC | PRN
  Administered 2020-08-29: 25 ug/kg/min via INTRAVENOUS

## 2020-08-29 MED ORDER — BISACODYL 10 MG RE SUPP: 10.0000 mg | Freq: Every day | RECTAL | Status: DC | PRN

## 2020-08-29 MED ORDER — MORPHINE SULFATE (PF) 4 MG/ML IV SOLN
4.0000 mg | INTRAVENOUS | Status: DC | PRN
Start: 2020-08-29 — End: 2020-08-29

## 2020-08-29 MED ORDER — MIDAZOLAM HCL 2 MG/2ML IJ SOLN
INTRAMUSCULAR | Status: AC
  Filled 2020-08-29: qty 2

## 2020-08-29 MED ORDER — DOCUSATE SODIUM 100 MG PO CAPS
100.0000 mg | ORAL_CAPSULE | Freq: Two times a day (BID) | ORAL | Status: DC
  Administered 2020-08-29 – 2020-08-30 (×3): 100 mg via ORAL
  Filled 2020-08-29 (×3): qty 1

## 2020-08-29 MED ORDER — MIDAZOLAM HCL 5 MG/5ML IJ SOLN
INTRAMUSCULAR | Status: DC | PRN
  Administered 2020-08-29: 2 mg via INTRAVENOUS

## 2020-08-29 MED ORDER — CHLORHEXIDINE GLUCONATE 0.12 % MT SOLN
15.0000 mL | Freq: Once | OROMUCOSAL | Status: DC
  Filled 2020-08-29: qty 15

## 2020-08-29 MED ORDER — ACETAMINOPHEN 500 MG PO TABS
1000.0000 mg | ORAL_TABLET | Freq: Four times a day (QID) | ORAL | Status: AC
  Administered 2020-08-29 – 2020-08-30 (×4): 1000 mg via ORAL
  Filled 2020-08-29 (×4): qty 2

## 2020-08-29 MED ORDER — ONDANSETRON HCL 4 MG PO TABS: 4.0000 mg | ORAL_TABLET | Freq: Four times a day (QID) | ORAL | Status: DC | PRN

## 2020-08-29 MED ORDER — NITROGLYCERIN 0.4 MG SL SUBL: 0.4000 mg | SUBLINGUAL_TABLET | SUBLINGUAL | Status: DC | PRN

## 2020-08-29 MED ORDER — CYCLOBENZAPRINE HCL 10 MG PO TABS
10.0000 mg | ORAL_TABLET | Freq: Three times a day (TID) | ORAL | Status: DC | PRN
  Administered 2020-08-29: 10 mg via ORAL
  Filled 2020-08-29: qty 1

## 2020-08-29 MED ORDER — PHENYLEPHRINE HCL-NACL 10-0.9 MG/250ML-% IV SOLN
INTRAVENOUS | Status: DC | PRN
  Administered 2020-08-29: 50 ug/min via INTRAVENOUS

## 2020-08-29 MED ORDER — FENTANYL CITRATE (PF) 250 MCG/5ML IJ SOLN
INTRAMUSCULAR | Status: DC | PRN
  Administered 2020-08-29 (×2): 50 ug via INTRAVENOUS

## 2020-08-29 MED ORDER — ZOLPIDEM TARTRATE 5 MG PO TABS
5.0000 mg | ORAL_TABLET | Freq: Every evening | ORAL | Status: DC | PRN
  Administered 2020-08-29: 5 mg via ORAL
  Filled 2020-08-29: qty 1

## 2020-08-29 MED ORDER — DILTIAZEM HCL ER COATED BEADS 120 MG PO CP24
240.0000 mg | ORAL_CAPSULE | Freq: Every day | ORAL | Status: DC
  Administered 2020-08-30: 240 mg via ORAL
  Filled 2020-08-29: qty 2

## 2020-08-29 MED ORDER — BACITRACIN ZINC 500 UNIT/GM EX OINT
TOPICAL_OINTMENT | CUTANEOUS | Status: AC
  Filled 2020-08-29: qty 28.35

## 2020-08-29 MED ORDER — LIDOCAINE 2% (20 MG/ML) 5 ML SYRINGE
INTRAMUSCULAR | Status: DC | PRN
  Administered 2020-08-29: 100 mg via INTRAVENOUS

## 2020-08-29 MED ORDER — PHENYLEPHRINE 40 MCG/ML (10ML) SYRINGE FOR IV PUSH (FOR BLOOD PRESSURE SUPPORT)
PREFILLED_SYRINGE | INTRAVENOUS | Status: DC | PRN
  Administered 2020-08-29: 80 ug via INTRAVENOUS
  Administered 2020-08-29: 200 ug via INTRAVENOUS
  Administered 2020-08-29: 120 ug via INTRAVENOUS

## 2020-08-29 MED ORDER — SODIUM CHLORIDE 0.9% FLUSH: 3.0000 mL | INTRAVENOUS | Status: DC | PRN

## 2020-08-29 MED ORDER — MENTHOL 3 MG MT LOZG
1.0000 | LOZENGE | OROMUCOSAL | Status: DC | PRN
  Filled 2020-08-29: qty 9

## 2020-08-29 MED ORDER — 0.9 % SODIUM CHLORIDE (POUR BTL) OPTIME
TOPICAL | Status: DC | PRN
  Administered 2020-08-29: 1000 mL

## 2020-08-29 MED ORDER — ATORVASTATIN CALCIUM 80 MG PO TABS
80.0000 mg | ORAL_TABLET | Freq: Every evening | ORAL | Status: DC
  Administered 2020-08-29: 80 mg via ORAL
  Filled 2020-08-29: qty 1

## 2020-08-29 MED ORDER — SODIUM CHLORIDE 0.9 % IV SOLN
250.0000 mL | INTRAVENOUS | Status: DC
  Administered 2020-08-29: 250 mL via INTRAVENOUS

## 2020-08-29 MED ORDER — ACETAMINOPHEN 650 MG RE SUPP: 650.0000 mg | RECTAL | Status: DC | PRN

## 2020-08-29 MED ORDER — LACTATED RINGERS IV SOLN: INTRAVENOUS | Status: DC

## 2020-08-29 MED ORDER — MEPERIDINE HCL 25 MG/ML IJ SOLN: 6.2500 mg | INTRAMUSCULAR | Status: DC | PRN

## 2020-08-29 MED ORDER — BUPIVACAINE-EPINEPHRINE 0.25% -1:200000 IJ SOLN
INTRAMUSCULAR | Status: DC | PRN
  Administered 2020-08-29 (×2): 10 mL

## 2020-08-29 MED ORDER — PANTOPRAZOLE SODIUM 40 MG PO TBEC
80.0000 mg | DELAYED_RELEASE_TABLET | Freq: Every day | ORAL | Status: DC
  Administered 2020-08-30: 80 mg via ORAL
  Filled 2020-08-29: qty 2

## 2020-08-29 MED ORDER — THROMBIN (RECOMBINANT) 5000 UNITS EX SOLR
CUTANEOUS | Status: AC
  Filled 2020-08-29: qty 5000

## 2020-08-29 MED ORDER — ORAL CARE MOUTH RINSE: 15.0000 mL | Freq: Once | OROMUCOSAL | Status: DC

## 2020-08-29 MED ORDER — LORATADINE 10 MG PO TABS: 10.0000 mg | ORAL_TABLET | Freq: Every day | ORAL | Status: DC

## 2020-08-29 MED ORDER — ONDANSETRON HCL 4 MG/2ML IJ SOLN: 4.0000 mg | Freq: Four times a day (QID) | INTRAMUSCULAR | Status: DC | PRN

## 2020-08-29 MED ORDER — THROMBIN 5000 UNITS EX SOLR
OROMUCOSAL | Status: DC | PRN
  Administered 2020-08-29: 5 mL via TOPICAL

## 2020-08-29 MED ORDER — SODIUM CHLORIDE 0.9% FLUSH
3.0000 mL | Freq: Two times a day (BID) | INTRAVENOUS | Status: DC
  Administered 2020-08-29 (×2): 3 mL via INTRAVENOUS

## 2020-08-29 SURGICAL SUPPLY — 54 items
BENZOIN TINCTURE PRP APPL 2/3 (GAUZE/BANDAGES/DRESSINGS) ×2 IMPLANT
BLADE CLIPPER SURG (BLADE) IMPLANT
BUR MATCHSTICK NEURO 3.0 LAGG (BURR) ×2 IMPLANT
BUR PRECISION FLUTE 6.0 (BURR) ×2 IMPLANT
CANISTER SUCT 3000ML PPV (MISCELLANEOUS) ×2 IMPLANT
CARTRIDGE OIL MAESTRO DRILL (MISCELLANEOUS) ×1 IMPLANT
CNTNR URN SCR LID CUP LEK RST (MISCELLANEOUS) ×1 IMPLANT
CONT SPEC 4OZ STRL OR WHT (MISCELLANEOUS) ×1
COVER BACK TABLE 60X90IN (DRAPES) ×2 IMPLANT
COVER WAND RF STERILE (DRAPES) ×2 IMPLANT
DECANTER SPIKE VIAL GLASS SM (MISCELLANEOUS) ×2 IMPLANT
DIFFUSER DRILL AIR PNEUMATIC (MISCELLANEOUS) ×2 IMPLANT
DRAPE C-ARM 42X72 X-RAY (DRAPES) ×4 IMPLANT
DRAPE HALF SHEET 40X57 (DRAPES) ×2 IMPLANT
DRAPE LAPAROTOMY 100X72X124 (DRAPES) ×2 IMPLANT
DRAPE SURG 17X23 STRL (DRAPES) ×8 IMPLANT
DRSG OPSITE POSTOP 4X6 (GAUZE/BANDAGES/DRESSINGS) ×2 IMPLANT
ELECT BLADE 4.0 EZ CLEAN MEGAD (MISCELLANEOUS) ×2
ELECT REM PT RETURN 9FT ADLT (ELECTROSURGICAL) ×2
ELECTRODE BLDE 4.0 EZ CLN MEGD (MISCELLANEOUS) ×1 IMPLANT
ELECTRODE REM PT RTRN 9FT ADLT (ELECTROSURGICAL) ×1 IMPLANT
GAUZE 4X4 16PLY RFD (DISPOSABLE) ×2 IMPLANT
GLOVE BIO SURGEON STRL SZ8 (GLOVE) ×4 IMPLANT
GLOVE BIO SURGEON STRL SZ8.5 (GLOVE) ×4 IMPLANT
GLOVE EXAM NITRILE XL STR (GLOVE) IMPLANT
GOWN STRL REUS W/ TWL LRG LVL3 (GOWN DISPOSABLE) IMPLANT
GOWN STRL REUS W/ TWL XL LVL3 (GOWN DISPOSABLE) ×2 IMPLANT
GOWN STRL REUS W/TWL 2XL LVL3 (GOWN DISPOSABLE) IMPLANT
GOWN STRL REUS W/TWL LRG LVL3 (GOWN DISPOSABLE)
GOWN STRL REUS W/TWL XL LVL3 (GOWN DISPOSABLE) ×2
HEMOSTAT POWDER KIT SURGIFOAM (HEMOSTASIS) ×2 IMPLANT
KIT BASIN OR (CUSTOM PROCEDURE TRAY) ×2 IMPLANT
KIT TURNOVER KIT B (KITS) ×2 IMPLANT
MILL MEDIUM DISP (BLADE) ×2 IMPLANT
NEEDLE HYPO 21X1.5 SAFETY (NEEDLE) IMPLANT
NEEDLE HYPO 22GX1.5 SAFETY (NEEDLE) ×2 IMPLANT
NS IRRIG 1000ML POUR BTL (IV SOLUTION) ×2 IMPLANT
OIL CARTRIDGE MAESTRO DRILL (MISCELLANEOUS) ×2
PACK LAMINECTOMY NEURO (CUSTOM PROCEDURE TRAY) ×2 IMPLANT
PAD ARMBOARD 7.5X6 YLW CONV (MISCELLANEOUS) ×6 IMPLANT
PATTIES SURGICAL .5 X1 (DISPOSABLE) IMPLANT
RASP 3.0MM (RASP) ×2 IMPLANT
SPONGE LAP 4X18 RFD (DISPOSABLE) IMPLANT
SPONGE NEURO XRAY DETECT 1X3 (DISPOSABLE) IMPLANT
SPONGE SURGIFOAM ABS GEL 100 (HEMOSTASIS) IMPLANT
STRIP CLOSURE SKIN 1/2X4 (GAUZE/BANDAGES/DRESSINGS) ×2 IMPLANT
SUT VIC AB 1 CT1 18XBRD ANBCTR (SUTURE) ×2 IMPLANT
SUT VIC AB 1 CT1 8-18 (SUTURE) ×2
SUT VIC AB 2-0 CP2 18 (SUTURE) ×4 IMPLANT
SYR 20ML LL LF (SYRINGE) IMPLANT
TOWEL GREEN STERILE (TOWEL DISPOSABLE) ×2 IMPLANT
TOWEL GREEN STERILE FF (TOWEL DISPOSABLE) ×2 IMPLANT
TRAY FOLEY MTR SLVR 16FR STAT (SET/KITS/TRAYS/PACK) ×2 IMPLANT
WATER STERILE IRR 1000ML POUR (IV SOLUTION) ×2 IMPLANT

## 2020-08-29 NOTE — Anesthesia Postprocedure Evaluation (Signed)
Anesthesia Post Note  Patient: Elaine Roach  Procedure(s) Performed: EXPLORE THORACIC FUSION, REMOVAL THORACIC TEN INSTRUMENTATION (N/A )     Patient location during evaluation: PACU Anesthesia Type: General Level of consciousness: sedated and patient cooperative Pain management: pain level controlled Vital Signs Assessment: post-procedure vital signs reviewed and stable Respiratory status: spontaneous breathing Cardiovascular status: stable Anesthetic complications: no   No complications documented.  Last Vitals:  Vitals:   08/29/20 1130 08/29/20 1546  BP: 122/66 (!) 116/49  Pulse: 66 87  Resp: 16 16  Temp: 36.6 C 36.9 C  SpO2: 93% 96%    Last Pain:  Vitals:   08/29/20 1546  TempSrc: Oral  PainSc:                  Nolon Nations

## 2020-08-29 NOTE — H&P (Signed)
Subjective: The patient is a 70 year old white female who has had multiple spine surgeries.  Most recently she had extension of her fusion to the thoracic region.  She has developed worsening back pain.  She was worked up with a CT scan which demonstrated findings consistent with a thoracic pseudoarthrosis.  I discussed the various treatment options with her.  She has decided proceed with surgery.  Past Medical History:  Diagnosis Date  . Arthritis   . Carotid artery calcification   . Complication of anesthesia    pt. woke up during hand surgery and colonoscopy, cataract surgery  . Coronary artery disease   . Diabetes mellitus without complication ([place])    type 2    dx 10 yrs ago  . Dysrhythmia    BENIGN PVC'S  . GERD (gastroesophageal reflux disease)   . Headache   . History of OCD (obsessive compulsive disorder)    TO A DEGREE  . Hypertension   . Insomnia   . Mixed hyperlipidemia   . Nonalcoholic steatohepatitis ([facility])    DX IN 1995  . Osteoarthritis   . Pneumonia    walking pneu. in the past  . PONV (postoperative nausea and vomiting)     Past Surgical History:  Procedure Laterality Date  . ANTERIOR CERVICAL DECOMP/DISCECTOMY FUSION  07/19/2017   Procedure: ANTERIOR CERVICAL DECOMPRESSION/DISCECTOMY FUSION , INTERBODY PROSTHESIS, ANTERIOR PLATE CERVICAL SIX- CERVICAL SEVEN, CERVICAL SEVEN- THORACIC ONE, EXPLORE OLD FUSION;  Surgeon: Newman Pies, MD;  Location: [facility];  Service: Neurosurgery;;  . BACK SURGERY     x3  . BUNIONECTOMY     RIGHT  . CARDIAC CATHETERIZATION    . CARPAL TUNNEL RELEASE     RIGHT  . CHOLECYSTECTOMY    . GANGLION CYST EXCISION Right   . LEFT HEART CATH AND CORONARY ANGIOGRAPHY N/A 04/27/2018   Procedure: LEFT HEART CATH AND CORONARY ANGIOGRAPHY;  Surgeon: Leonie Man, MD;  Location: [facility];  Service: Cardiovascular;  Laterality: N/A;  . MYELOGRAM    . SHOULDER ARTHROSCOPY W/ ROTATOR CUFF REPAIR     4 SCOPES.Marland KitchenMarland KitchenALL ON THE RIGHT   . TUBAL LIGATION    . WOUND EXPLORATION  03/19/2015   CERVICAL WOUND   . WOUND EXPLORATION N/A 03/19/2015   Procedure: CERVICAL WOUND EXPLORATION, EVACUATION OF CERVICAL HEMATOMA;  Surgeon: Newman Pies, MD;  Location: [facility];  Service: Neurosurgery;  Laterality: N/A;  cervical wound exploration, evacuation of cervical hematoma    Allergies  Allergen Reactions  . Codeine Itching and Swelling    Facial swelling  . Lisinopril Hives  . Tape Rash    Surgical tape     Social History   Tobacco Use  . Smoking status: Former Smoker    Packs/day: 2.00    Years: 17.00    Pack years: 34.00    Types: Cigarettes  . Smokeless tobacco: Never Used  . Tobacco comment: quit when she was 35  Substance Use Topics  . Alcohol use: Yes    Comment: OCCASIONAL  WINE    Family History  Problem Relation Age of Onset  . Breast cancer Mother   . Heart Problems Father   . Heart failure Paternal Grandmother   . Heart failure Paternal Grandfather    Prior to Admission medications   Medication Sig Start Date End Date Taking? Authorizing Provider  aspirin 81 MG EC tablet Take 81 mg by mouth daily.   Yes [provider]  atorvastatin (LIPITOR) 80 MG tablet Take  1 tablet (80 mg total) by mouth every evening. 08/28/20  Yes Richardo Priest, MD  Cholecalciferol (VITAMIN D3) 5000 units CAPS Take 5,000 Units by mouth daily.   Yes [provider]  cyclobenzaprine (FLEXERIL) 10 MG tablet Take 1 tablet (10 mg total) by mouth 3 (three) times daily as needed for muscle spasms. 03/26/20  Yes Newman Pies, MD  diltiazem (CARDIZEM CD) 240 MG 24 hr capsule Take 1 capsule (240 mg total) by mouth daily. 08/28/20  Yes Richardo Priest, MD  docusate sodium (COLACE) 100 MG capsule Take 1 capsule (100 mg total) by mouth 2 (two) times daily. Patient taking differently: Take 100 mg by mouth daily as needed for moderate constipation. 03/26/20  Yes Newman Pies, MD  ezetimibe (ZETIA) 10 MG tablet  Take 1 tablet (10 mg total) by mouth every evening. 08/28/20  Yes Richardo Priest, MD  isosorbide mononitrate (IMDUR) 60 MG 24 hr tablet Take 1 tablet (60 mg total) by mouth daily. 08/28/20  Yes Richardo Priest, MD  metFORMIN (GLUCOPHAGE) 500 MG tablet Take 500 mg by mouth 2 (two) times daily with a meal.   Yes [provider]  Multiple Vitamins-Minerals (MULTIVITAMIN WITH MINERALS) tablet Take 1 tablet by mouth daily.   Yes [provider]  omeprazole (PRILOSEC) 40 MG capsule Take 40 mg by mouth every morning.    Yes [provider]  ranolazine (RANEXA) 500 MG 12 hr tablet Take 1 tablet (500 mg total) by mouth 2 (two) times daily. 08/28/20  Yes Richardo Priest, MD  sertraline (ZOLOFT) 100 MG tablet Take 100 mg by mouth at bedtime.   Yes [provider]  zolpidem (AMBIEN) 10 MG tablet Take 10 mg by mouth at bedtime as needed. 08/15/20  Yes [provider]  cetirizine (ZYRTEC) 10 MG tablet Take 10 mg by mouth daily as needed for allergies.    [provider]  glucose blood test strip 1 strip by Misc.(Non-Drug; Combo Route) route daily. 07/06/17   [provider]  losartan (COZAAR) 50 MG tablet Take 50 mg by mouth daily as needed (Blood Pressure is greater than 160 on top).    [provider]  nitroGLYCERIN (NITROSTAT) 0.4 MG SL tablet Place 0.4 mg under the tongue every 5 (five) minutes as needed for chest pain.    [provider]     Review of Systems  Positive ROS: As above  All other systems have been reviewed and were otherwise negative with the exception of those mentioned in the HPI and as above.  Objective: Vital signs in last 24 hours: Temp:  [98.7 F (37.1 C)] 98.7 F (37.1 C) (03/17 0604) Pulse Rate:  [73-78] 78 (03/17 0604) Resp:  [18] 18 (03/17 0604) BP: (114-134)/(68-78) 114/78 (03/17 0604) SpO2:  [97 %-99 %] 99 % (03/17 0604) Weight:  [69 kg-69.9 kg] 69 kg (03/17 0604) Estimated body mass index is  26.13 kg/m as calculated from the following:   Height as of this encounter: 5' 4"  (1.626 m).   Weight as of this encounter: 69 kg.   General Appearance: Alert Head: Normocephalic, without obvious abnormality, atraumatic Eyes: PERRL, conjunctiva/corneas clear, EOM's intact,    Ears: Normal  Throat: Normal  Neck: Supple, Back: Her incisions are well-healed. Lungs: Clear to auscultation bilaterally, respirations unlabored Heart: Regular rate and rhythm, no murmur, rub or gallop Abdomen: Soft, non-tender Extremities: Extremities normal, atraumatic, no cyanosis or edema Skin: unremarkable  NEUROLOGIC:   Mental status: alert and oriented,Motor  Exam - grossly normal Sensory Exam - grossly normal Reflexes:  Coordination - grossly normal Gait - grossly normal Balance - grossly normal Cranial Nerves: I: smell Not tested  II: visual acuity  OS: Normal  OD: Normal   II: visual fields Full to confrontation  II: pupils Equal, round, reactive to light  III,VII: ptosis None  III,IV,VI: extraocular muscles  Full ROM  V: mastication Normal  V: facial light touch sensation  Normal  V,VII: corneal reflex  Present  VII: facial muscle function - upper  Normal  VII: facial muscle function - lower Normal  VIII: hearing Not tested  IX: soft palate elevation  Normal  IX,X: gag reflex Present  XI: trapezius strength  5/5  XI: sternocleidomastoid strength 5/5  XI: neck flexion strength  5/5  XII: tongue strength  Normal    Data Review Lab Results  Component Value Date   WBC 9.7 03/26/2020   HGB 8.8 (L) 03/26/2020   HCT 28.0 (L) 03/26/2020   MCV 86.2 03/26/2020   PLT 218 03/26/2020   Lab Results  Component Value Date   NA 142 03/26/2020   K 4.2 03/26/2020   CL 106 03/26/2020   CO2 26 03/26/2020   BUN 16 03/26/2020   CREATININE 1.05 (H) 03/26/2020   GLUCOSE 164 (H) 03/26/2020   No results found for: INR, PROTIME  Assessment/Plan: Thoracic pseudoarthrosis, thoracic spine pain:  I have discussed the situation with the patient.  We have discussed the various treatment options.  I have described the surgical treatment option of an exploration of her fusion with removal of the upper/loose instrumentation.  I described the surgery to her.  We have discussed the risk, benefits, alternatives, expected postoperative course, and likelihood of achieving our goals with surgery.  I have answered all her questions.  She has decided to proceed with surgery.   [name] 08/29/2020 7:23 AM

## 2020-08-29 NOTE — Transfer of Care (Signed)
Immediate Anesthesia Transfer of Care Note  Patient: Elaine Roach  Procedure(s) Performed: EXPLORE THORACIC FUSION, REMOVAL THORACIC TEN INSTRUMENTATION (N/A )  Patient Location: PACU  Anesthesia Type:General  Level of Consciousness: drowsy  Airway & Oxygen Therapy: Patient Spontanous Breathing and Patient connected to nasal cannula oxygen  Post-op Assessment: Report given to RN and Post -op Vital signs reviewed and stable  Post vital signs: Reviewed and stable  Last Vitals:  Vitals Value Taken Time  BP 143/65 08/29/20 0925  Temp    Pulse 80 08/29/20 0926  Resp 28 08/29/20 0926  SpO2 95 % 08/29/20 0926  Vitals shown include unvalidated device data.  Last Pain:  Vitals:   08/29/20 0643  TempSrc:   PainSc: 8       Patients Stated Pain Goal: 3 (85/99/23 4144)  Complications: No complications documented.

## 2020-08-29 NOTE — Op Note (Signed)
Brief history: The patient is a 70 year old white female on whom I performed a thoracic instrumentation and fusion.  She initially did well but has developed progressively worsening thoracic spine pain with pain radiating around her ribs.  She was worked up with a thoracic and lumbar CT scan which demonstrated findings consistent with a pseudoarthrosis at T10-11 with loose T10 screws.  I discussed the situation with the patient.  We discussed the various treatment options.  She has decided to proceed with an exploration of fusion and removal of the loose screws.  Preoperative diagnosis: Thoracic pseudoarthrosis, thoracic spine pain, thoracic radiculopathy  Postop diagnosis: The same  Procedure: Exploration of thoracic lumbar fusion; removal of thoracic instrumentation/T10 pedicle screws  Surgeon: Dr. Earle Gell  Assistant: Arnetha Massy, NP  Anesthesia: General tracheal  Estimated blood loss: Minimal  Specimens: None  Drains: None  Complications: None  Description of procedure: The patient was brought to the operating room by the anesthesia team.  General endotracheal anesthesia was induced.  She was turned to the prone position on the Enders frame.  The patient's thoracolumbar region was then prepared with Betadine scrub and Betadine solution.  Sterile drapes were applied.  I then injected the area to be incised with Marcaine with epinephrine solution.  I used a scalpel to incise through the patient's upper thoracic incision.  I used electrocautery to perform a bilateral subperiosteal dissection exposing the pedicle screws and rods at T10 and T11.  We used the Versatrac retractor for exposure.  I remove the caps from the T10 pedicle screws.  I then used a high-speed drill to cut the rods bilaterally between the T10 and T11 pedicle screws, using constant irrigation.  We then remove the rod.  I then inspected the T10 pedicle screws they were loose indicative of pseudoarthrosis.  I remove the  loose screws at T10.  We then obtained hemostasis using bipolar electrocautery.  I remove the retractor and reapproximated the patient's thoracic fascia with interrupted #1 Vicryl suture.  We reapproximated the subcutaneous tissue with interrupted 2-0 Vicryl suture.  We then reapproximated the skin with Steri-Strips and benzoin.  The wound was then coated with bacitracin ointment.  A sterile dressing was applied.  The drapes were removed.  By report all sponge, instrument, and needle counts were correct at the end this case.

## 2020-08-29 NOTE — Telephone Encounter (Signed)
Outpatient Medication Detail   Disp Refills Start End   ranolazine (RANEXA) 500 MG 12 hr tablet 180 tablet 3 08/28/2020    Sig - Route: Take 1 tablet (500 mg total) by mouth 2 (two) times daily. - Oral   Sent to pharmacy as: ranolazine (RANEXA) 500 MG 12 hr tablet   E-Prescribing Status: Receipt confirmed by pharmacy (08/28/2020 10:48 AM EDT)     Pharmacy  [facility] [place] [facility]

## 2020-08-29 NOTE — Anesthesia Procedure Notes (Signed)
Procedure Name: Intubation Date/Time: 08/29/2020 7:38 AM Performed by: Imagene Riches, CRNA Pre-anesthesia Checklist: Patient identified, Emergency Drugs available, Suction available and Patient being monitored Patient Re-evaluated:Patient Re-evaluated prior to induction Oxygen Delivery Method: Circle System Utilized Preoxygenation: Pre-oxygenation with 100% oxygen Induction Type: IV induction Ventilation: Mask ventilation without difficulty Laryngoscope Size: Miller and 2 Grade View: Grade I Tube type: Oral Tube size: 7.0 mm Number of attempts: 1 Airway Equipment and Method: Stylet and Oral airway Placement Confirmation: ETT inserted through vocal cords under direct vision,  positive ETCO2 and breath sounds checked- equal and bilateral Secured at: 22 cm Tube secured with: Tape Dental Injury: Teeth and Oropharynx as per pre-operative assessment

## 2020-08-30 LAB — GLUCOSE, CAPILLARY: Glucose-Capillary: 139 mg/dL — ABNORMAL HIGH (ref 70–99)

## 2020-08-30 MED ORDER — HYDROMORPHONE HCL 2 MG PO TABS: 2.0000 mg | ORAL_TABLET | ORAL | 0 refills | Status: DC | PRN

## 2020-08-30 MED ORDER — CYCLOBENZAPRINE HCL 10 MG PO TABS: 10.0000 mg | ORAL_TABLET | Freq: Three times a day (TID) | ORAL | 0 refills | Status: DC | PRN

## 2020-08-30 MED ORDER — DOCUSATE SODIUM 100 MG PO CAPS: 100.0000 mg | ORAL_CAPSULE | Freq: Two times a day (BID) | ORAL | 0 refills | Status: AC

## 2020-08-30 MED FILL — Thrombin (Recombinant) For Soln 5000 Unit: CUTANEOUS | Qty: 5000 | Status: AC

## 2020-08-30 NOTE — Progress Notes (Signed)
Patient is discharged from room 3C02 at this time. Alert and in stable condition. IV site d/c'd and instructions read to patient with understanding verbalized and all questions answered. Left unit via wheelchair with all belongings at side.

## 2020-08-30 NOTE — Progress Notes (Signed)
PT Cancellation Note  Patient Details Name: Elaine Roach MRN: [id number] DOB: 09-01-1950   Cancelled Treatment:    Reason Eval/Treat Not Completed: PT screened, no needs identified, will sign off per OT, pt has no PT needs for evaluation at this time as she has had multiple back surgeries recently and functioning at mod I level. Will screen and sign off. Thanks.   Marguarite Arbour A Dshawn Mcnay 08/30/2020, 9:34 AM Marisa Severin, PT, DPT Acute Rehabilitation Services Pager [phone] Office [phone]

## 2020-08-30 NOTE — Discharge Summary (Signed)
Physician Discharge Summary  Patient ID: KARRA PINK MRN: [id number] DOB/AGE: [birth year] 70 y.o.  Admit date: 08/29/2020 Discharge date: 08/30/2020  Admission Diagnoses: Thoracic pseudoarthrosis, thoracic spine pain, thoracic radiculopathy  Discharge Diagnoses: Same Active Problems:   Lumbar pseudoarthrosis   Discharged Condition: good  Hospital Course: I performed an exploration of patient's fusion with removal of the T10 pedicle screws on 08/29/2020.  The surgery went well.  The patient's postoperative course was unremarkable.  On postoperative day #1 she requested discharge to home.  She was given written and oral discharge instructions.  All her questions were answered.  Consults: PT, OT, care management Significant Diagnostic Studies: None Treatments: Exploration of thoracolumbar fusion/removal of T10 pedicle screws Discharge Exam: Blood pressure 140/65, pulse 91, temperature 98.1 F (36.7 C), temperature source Oral, resp. rate 18, height 5' 4"  (1.626 m), weight 69 kg, SpO2 97 %. The patient is alert and pleasant.  She looks and feels much better.  Her strength is normal.  Disposition: Home   Allergies as of 08/30/2020      Reactions   Codeine Itching, Swelling   Facial swelling   Lisinopril Hives   Tape Rash   Surgical tape       Medication List    STOP taking these medications   zolpidem 10 MG tablet Commonly known as: AMBIEN     TAKE these medications   aspirin 81 MG EC tablet Take 81 mg by mouth daily.   atorvastatin 80 MG tablet Commonly known as: LIPITOR Take 1 tablet (80 mg total) by mouth every evening.   cetirizine 10 MG tablet Commonly known as: ZYRTEC Take 10 mg by mouth daily as needed for allergies.   cyclobenzaprine 10 MG tablet Commonly known as: FLEXERIL Take 1 tablet (10 mg total) by mouth 3 (three) times daily as needed for muscle spasms.   diltiazem 240 MG 24 hr capsule Commonly known as: CARDIZEM CD Take 1 capsule (240 mg  total) by mouth daily.   docusate sodium 100 MG capsule Commonly known as: COLACE Take 1 capsule (100 mg total) by mouth 2 (two) times daily. What changed:   when to take this  reasons to take this   ezetimibe 10 MG tablet Commonly known as: ZETIA Take 1 tablet (10 mg total) by mouth every evening.   glucose blood test strip 1 strip by Misc.(Non-Drug; Combo Route) route daily.   HYDROmorphone 2 MG tablet Commonly known as: DILAUDID Take 1-2 tablets (2-4 mg total) by mouth every 4 (four) hours as needed for moderate pain.   isosorbide mononitrate 60 MG 24 hr tablet Commonly known as: IMDUR Take 1 tablet (60 mg total) by mouth daily.   losartan 50 MG tablet Commonly known as: COZAAR Take 50 mg by mouth daily as needed (Blood Pressure is greater than 160 on top).   metFORMIN 500 MG tablet Commonly known as: GLUCOPHAGE Take 500 mg by mouth 2 (two) times daily with a meal.   multivitamin with minerals tablet Take 1 tablet by mouth daily.   nitroGLYCERIN 0.4 MG SL tablet Commonly known as: NITROSTAT Place 0.4 mg under the tongue every 5 (five) minutes as needed for chest pain.   omeprazole 40 MG capsule Commonly known as: PRILOSEC Take 40 mg by mouth every morning.   ranolazine 500 MG 12 hr tablet Commonly known as: RANEXA Take 1 tablet (500 mg total) by mouth 2 (two) times daily.   sertraline 100 MG tablet Commonly known as: ZOLOFT Take 100 mg by  mouth at bedtime.   Vitamin D3 125 MCG (5000 UT) Caps Take 5,000 Units by mouth daily.        Signed: [name] 08/30/2020, 6:42 AM

## 2020-08-30 NOTE — Discharge Instructions (Signed)
Wound Care Keep incision covered and dry for three days.  Do not put any creams, lotions, or ointments on incision. Leave steri-strips on back.  They will fall off by themselves. Activity Walk each and every day, increasing distance each day. No lifting greater than 5 lbs.  Avoid excessive neck motion. No driving for 2 weeks; may ride as a passenger locally. If provided with back brace, wear when out of bed.  It is not necessary to wear brace in bed. Diet Resume your normal diet.   Call Your Doctor If Any of These Occur Redness, drainage, or swelling at the wound.  Temperature greater than 101 degrees. Severe pain not relieved by pain medication. Incision starts to come apart. Follow Up Appt Call today for appointment in 1-2 weeks ([phone]) or for problems.  If you have any hardware placed in your spine, you will need an x-ray before your appointment.

## 2020-08-30 NOTE — Evaluation (Signed)
Occupational Therapy Evaluation Patient Details Name: Elaine Roach MRN: [id number] DOB: March 08, 1951 Today's Date: 08/30/2020    History of Present Illness 70 yo female s/p exploration of thoracic lumbar fusion; removal of thoracic instrumentation/T10 pedicle screws. PMH including arthritis, CAD, HTN, OA, ACDF (2019), back sx x3, and shoulder replacement R.    Clinical Impression   PTA, pt was living alone and was independent; sister planning to stay at dc. Currently, pt performing ADLs and functional at Mod I level with increased time as needed. Provided education and handout on back precautions, bed mobility, grooming, LB ADLs, toileting, and shower transfer with shower seat; pt demonstrated understanding. Answered all pt questions. Recommend dc home once medically stable per physician. All acute OT needs met and will sign off. Thank you.     Follow Up Recommendations  No OT follow up    Equipment Recommendations  None recommended by OT    Recommendations for Other Services       Precautions / Restrictions Precautions Precautions: Back Precaution Booklet Issued: Yes (comment) Required Braces or Orthoses: Other Brace Other Brace: No brace needed      Mobility Bed Mobility Overal bed mobility: Independent                  Transfers Overall transfer level: Independent                    Balance Overall balance assessment: No apparent balance deficits (not formally assessed)                                         ADL either performed or assessed with clinical judgement   ADL Overall ADL's : Modified independent                                       General ADL Comments: Pt performing ADLs and functional mobility with increased time as needed. Providing education on 70 yo female s/p L4-5 and L5-S1 fusion. PMH including back precautions, bed mobility, grooming, LB ADLs, toileting, and shower transafer.     Vision          Perception     Praxis      Pertinent Vitals/Pain Pain Assessment: No/denies pain     Hand Dominance     Extremity/Trunk Assessment Upper Extremity Assessment Upper Extremity Assessment: Overall WFL for tasks assessed   Lower Extremity Assessment Lower Extremity Assessment: Overall WFL for tasks assessed   Cervical / Trunk Assessment Cervical / Trunk Assessment: Other exceptions Cervical / Trunk Exceptions: s/p back sx   Communication Communication Communication: No difficulties   Cognition Arousal/Alertness: Awake/alert Behavior During Therapy: WFL for tasks assessed/performed Overall Cognitive Status: Within Functional Limits for tasks assessed                                     General Comments       Exercises     Shoulder Instructions      Home Living Family/patient expects to be discharged to:: Private residence Living Arrangements: Alone Available Help at Discharge: Family;Available PRN/intermittently Type of Home: House Home Access: Stairs to enter [employer] of Steps: 4 Entrance Stairs-Rails: Right Home Layout: One level  Bathroom Shower/Tub: Occupational psychologist: Standard     Home Equipment: Shower seat          Prior Functioning/Environment Level of Independence: Independent        Comments: Enjoys being in the garden and playing with her dogs        OT Problem List: Decreased activity tolerance;Impaired balance (sitting and/or standing);Decreased knowledge of use of DME or AE;Decreased knowledge of precautions;Pain      OT Treatment/Interventions:      OT Goals(Current goals can be found in the care plan section) Acute Rehab OT Goals Patient Stated Goal: Go home OT Goal Formulation: All assessment and education complete, DC therapy  OT Frequency:     Barriers to D/C:            Co-evaluation              AM-PAC OT "6 Clicks" Daily Activity     Outcome Measure Help  from another person eating meals?: None Help from another person taking care of personal grooming?: None Help from another person toileting, which includes using toliet, bedpan, or urinal?: None Help from another person bathing (including washing, rinsing, drying)?: None Help from another person to put on and taking off regular upper body clothing?: None Help from another person to put on and taking off regular lower body clothing?: None 6 Click Score: 24   End of Session Nurse Communication: Mobility status  Activity Tolerance: Patient tolerated treatment well Patient left: in bed;with call bell/phone within reach  OT Visit Diagnosis: Other abnormalities of gait and mobility (R26.89);Pain Pain - part of body:  (Back)                Time: 9242-6834 OT Time Calculation (min): 12 min Charges:  OT General Charges $OT Visit: 1 Visit OT Evaluation $OT Eval Low Complexity: [place], OTR/L Acute Rehab Pager: [phone] Office: [place] 08/30/2020, 8:41 AM

## 2020-09-24 DIAGNOSIS — M5134 Other intervertebral disc degeneration, thoracic region: Secondary | ICD-10-CM | POA: Diagnosis not present

## 2020-09-25 DIAGNOSIS — K219 Gastro-esophageal reflux disease without esophagitis: Secondary | ICD-10-CM | POA: Diagnosis not present

## 2020-09-25 DIAGNOSIS — K648 Other hemorrhoids: Secondary | ICD-10-CM | POA: Diagnosis not present

## 2020-09-25 DIAGNOSIS — K5730 Diverticulosis of large intestine without perforation or abscess without bleeding: Secondary | ICD-10-CM | POA: Diagnosis not present

## 2020-11-01 DIAGNOSIS — M546 Pain in thoracic spine: Secondary | ICD-10-CM | POA: Diagnosis not present

## 2020-11-14 DIAGNOSIS — M546 Pain in thoracic spine: Secondary | ICD-10-CM | POA: Diagnosis not present

## 2020-11-29 ENCOUNTER — Telehealth: Payer: Self-pay | Admitting: Cardiology

## 2020-11-29 MED ORDER — RANOLAZINE ER 500 MG PO TB12: 500.0000 mg | ORAL_TABLET | Freq: Two times a day (BID) | ORAL | 3 refills | Status: DC

## 2020-11-29 NOTE — Telephone Encounter (Signed)
Refill sent to pharmacy.   

## 2020-11-29 NOTE — Telephone Encounter (Signed)
*  STAT* If patient is at the pharmacy, call can be transferred to refill team.   1. Which medications need to be refilled? (please list name of each medication and dose if known) ranolazine (RANEXA) 500 MG 12 hr tablet  2. Which pharmacy/location (including street and city if local pharmacy) is medication to be sent to? [facility] [phone] - [place], [place] - [address]  3. Do they need a 30 day or 90 day supply? [place]

## 2020-12-03 ENCOUNTER — Other Ambulatory Visit: Payer: Self-pay | Admitting: Cardiology

## 2020-12-03 NOTE — Telephone Encounter (Signed)
Refill sent to pharmacy.   

## 2020-12-20 DIAGNOSIS — M5489 Other dorsalgia: Secondary | ICD-10-CM | POA: Diagnosis not present

## 2020-12-20 DIAGNOSIS — M5134 Other intervertebral disc degeneration, thoracic region: Secondary | ICD-10-CM | POA: Diagnosis not present

## 2020-12-30 DIAGNOSIS — M5134 Other intervertebral disc degeneration, thoracic region: Secondary | ICD-10-CM | POA: Diagnosis not present

## 2020-12-30 DIAGNOSIS — M5412 Radiculopathy, cervical region: Secondary | ICD-10-CM | POA: Diagnosis not present

## 2020-12-30 DIAGNOSIS — M5414 Radiculopathy, thoracic region: Secondary | ICD-10-CM | POA: Diagnosis not present

## 2020-12-30 DIAGNOSIS — M960 Pseudarthrosis after fusion or arthrodesis: Secondary | ICD-10-CM | POA: Diagnosis not present

## 2021-01-01 DIAGNOSIS — Z7984 Long term (current) use of oral hypoglycemic drugs: Secondary | ICD-10-CM | POA: Diagnosis not present

## 2021-01-01 DIAGNOSIS — E119 Type 2 diabetes mellitus without complications: Secondary | ICD-10-CM | POA: Diagnosis not present

## 2021-01-01 DIAGNOSIS — E782 Mixed hyperlipidemia: Secondary | ICD-10-CM | POA: Diagnosis not present

## 2021-01-01 DIAGNOSIS — I10 Essential (primary) hypertension: Secondary | ICD-10-CM | POA: Diagnosis not present

## 2021-01-01 DIAGNOSIS — D649 Anemia, unspecified: Secondary | ICD-10-CM | POA: Diagnosis not present

## 2021-01-01 DIAGNOSIS — F419 Anxiety disorder, unspecified: Secondary | ICD-10-CM | POA: Diagnosis not present

## 2021-01-01 DIAGNOSIS — E559 Vitamin D deficiency, unspecified: Secondary | ICD-10-CM | POA: Diagnosis not present

## 2021-01-08 DIAGNOSIS — E119 Type 2 diabetes mellitus without complications: Secondary | ICD-10-CM | POA: Diagnosis not present

## 2021-01-08 DIAGNOSIS — H04123 Dry eye syndrome of bilateral lacrimal glands: Secondary | ICD-10-CM | POA: Diagnosis not present

## 2021-01-21 DIAGNOSIS — M5414 Radiculopathy, thoracic region: Secondary | ICD-10-CM | POA: Diagnosis not present

## 2021-01-31 DIAGNOSIS — Z124 Encounter for screening for malignant neoplasm of cervix: Secondary | ICD-10-CM | POA: Diagnosis not present

## 2021-01-31 DIAGNOSIS — Z01419 Encounter for gynecological examination (general) (routine) without abnormal findings: Secondary | ICD-10-CM | POA: Diagnosis not present

## 2021-02-05 ENCOUNTER — Telehealth: Payer: Self-pay | Admitting: Cardiology

## 2021-02-05 NOTE — Telephone Encounter (Signed)
Left VM for pt to call back.

## 2021-02-05 NOTE — Telephone Encounter (Signed)
Pt states that she has a intermittent vibrating sensation in her left groin. Pt states that her hemoglobin was 10 in July and she was told to FU with GI but hasn't since she had her colonoscopy in 2/22. I informed the pt that a lot of her complaints could be coming from her low hemoglobin and she needed to let GI know so she could be seen. How do you advise?

## 2021-02-05 NOTE — Telephone Encounter (Signed)
New Message:       Patient says she needs to be seen, asap please.  She says she can feel her pulse beating over her entire body, also no energy. She says her Hemoglobin is low. Her breathing is off, feel like she is breathing with her pulse beat. She also says feels like a shock in her groin area, this have been going over a month. She just does not feel good.

## 2021-02-06 NOTE — Telephone Encounter (Signed)
Patient advised of the following, she's stopping by PCP office for blood work. Patient is aware feel free to call back should any other concerns may arise.

## 2021-02-07 DIAGNOSIS — D649 Anemia, unspecified: Secondary | ICD-10-CM | POA: Diagnosis not present

## 2021-02-11 DIAGNOSIS — M546 Pain in thoracic spine: Secondary | ICD-10-CM | POA: Diagnosis not present

## 2021-02-11 DIAGNOSIS — M5134 Other intervertebral disc degeneration, thoracic region: Secondary | ICD-10-CM | POA: Diagnosis not present

## 2021-02-11 DIAGNOSIS — M960 Pseudarthrosis after fusion or arthrodesis: Secondary | ICD-10-CM | POA: Diagnosis not present

## 2021-02-11 DIAGNOSIS — Z981 Arthrodesis status: Secondary | ICD-10-CM | POA: Diagnosis not present

## 2021-02-25 DIAGNOSIS — M5414 Radiculopathy, thoracic region: Secondary | ICD-10-CM | POA: Diagnosis not present

## 2021-03-20 DIAGNOSIS — Z23 Encounter for immunization: Secondary | ICD-10-CM | POA: Diagnosis not present

## 2021-03-20 DIAGNOSIS — R3989 Other symptoms and signs involving the genitourinary system: Secondary | ICD-10-CM | POA: Insufficient documentation

## 2021-03-20 DIAGNOSIS — R5382 Chronic fatigue, unspecified: Secondary | ICD-10-CM | POA: Diagnosis not present

## 2021-03-20 DIAGNOSIS — M6281 Muscle weakness (generalized): Secondary | ICD-10-CM | POA: Insufficient documentation

## 2021-03-20 DIAGNOSIS — D509 Iron deficiency anemia, unspecified: Secondary | ICD-10-CM | POA: Diagnosis not present

## 2021-03-26 DIAGNOSIS — M545 Low back pain, unspecified: Secondary | ICD-10-CM | POA: Diagnosis not present

## 2021-03-26 DIAGNOSIS — M546 Pain in thoracic spine: Secondary | ICD-10-CM | POA: Diagnosis not present

## 2021-03-27 ENCOUNTER — Other Ambulatory Visit: Payer: Self-pay | Admitting: Neurosurgery

## 2021-03-27 DIAGNOSIS — M545 Low back pain, unspecified: Secondary | ICD-10-CM

## 2021-03-27 DIAGNOSIS — M546 Pain in thoracic spine: Secondary | ICD-10-CM

## 2021-03-27 DIAGNOSIS — G8929 Other chronic pain: Secondary | ICD-10-CM

## 2021-04-01 ENCOUNTER — Ambulatory Visit
Admission: RE | Admit: 2021-04-01 | Discharge: 2021-04-01 | Disposition: A | Discharge status: Home / Self Care | Payer: PPO | Source: Ambulatory Visit | Attending: Neurosurgery | Admitting: Neurosurgery

## 2021-04-01 DIAGNOSIS — M545 Low back pain, unspecified: Secondary | ICD-10-CM

## 2021-04-01 DIAGNOSIS — M546 Pain in thoracic spine: Secondary | ICD-10-CM

## 2021-04-01 DIAGNOSIS — G8929 Other chronic pain: Secondary | ICD-10-CM

## 2021-04-01 DIAGNOSIS — M4325 Fusion of spine, thoracolumbar region: Secondary | ICD-10-CM | POA: Diagnosis not present

## 2021-04-01 DIAGNOSIS — M4324 Fusion of spine, thoracic region: Secondary | ICD-10-CM | POA: Diagnosis not present

## 2021-04-01 DIAGNOSIS — M4804 Spinal stenosis, thoracic region: Secondary | ICD-10-CM | POA: Diagnosis not present

## 2021-04-01 DIAGNOSIS — M4326 Fusion of spine, lumbar region: Secondary | ICD-10-CM | POA: Diagnosis not present

## 2021-04-01 MED ORDER — DIAZEPAM 5 MG PO TABS
5.0000 mg | ORAL_TABLET | Freq: Once | ORAL | Status: AC
  Administered 2021-04-01: 5 mg via ORAL

## 2021-04-01 MED ORDER — ONDANSETRON HCL 4 MG/2ML IJ SOLN
4.0000 mg | Freq: Once | INTRAMUSCULAR | Status: AC | PRN
  Administered 2021-04-01: 4 mg via INTRAMUSCULAR

## 2021-04-01 MED ORDER — IOPAMIDOL (ISOVUE-M 300) INJECTION 61%
10.0000 mL | Freq: Once | INTRAMUSCULAR | Status: AC
  Administered 2021-04-01: 10 mL via INTRATHECAL

## 2021-04-01 MED ORDER — MEPERIDINE HCL 50 MG/ML IJ SOLN
50.0000 mg | Freq: Once | INTRAMUSCULAR | Status: AC | PRN
  Administered 2021-04-01: 50 mg via INTRAMUSCULAR

## 2021-04-01 NOTE — Discharge Instr - Other Orders (Signed)
1323: Pt c/o pain and could not get on the table for myelo procedure. Dr. Denna Haggard ordered to give PRN pain medication prior to procedure to allow pt to get into position on table.

## 2021-04-01 NOTE — Discharge Instructions (Signed)

## 2021-04-02 NOTE — Progress Notes (Signed)
Cardiology Office Note:    Date:  04/03/2021   ID:  Elaine Roach, DOB 08/25/1950, MRN [id number]  PCP:  Algis Greenhouse, MD  Cardiologist:  Shirlee More, MD    Referring MD: Algis Greenhouse, MD    ASSESSMENT:    1. Mild CAD   2. Mixed hyperlipidemia   3. Essential hypertension   4. Orthostatic hypotension    PLAN:    In order of problems listed above:  Stable CAD 1 episode of nocturnal angina relieved with nitroglycerin continue medical treatment including aspirin calcium channel blocker oral nitrate and ranolazine. She has proximal muscle weakness on a statin we will have her discontinue it for now continue Zetia recheck lipids in 1 month additional therapy as needed PCSK9 inhibitor Stable at target continue current treatment including ARB   Next appointment: 6 months   Medication Adjustments/Labs and Tests Ordered: Current medicines are reviewed at length with the patient today.  Concerns regarding medicines are outlined above.  No orders of the defined types were placed in this encounter.  No orders of the defined types were placed in this encounter.   Chief Complaint  Patient presents with   Follow-up   Coronary Artery Disease    History of Present Illness:    Elaine Roach is a 70 y.o. female with a hx of mild CAD diabetes hypertension hyperlipidemia and orthostatic hypotension.  She was last seen 08/28/2020 doing well having no angina after the initiation of ranolazine treatment.  Compliance with diet, lifestyle and medications: Yes  She has ongoing trouble with back pain will see neurosurgery next week. She also has a great deal of proximal muscle weakness takes a high intensity statin I told her to stop it today we will recheck lipids in a month and if she really needs lipid-lowering therapy we will use a PCSK9 inhibitor. She has had 1 episode of nocturnal angina relieved with nitroglycerin.  Otherwise no chest pain edema shortness of breath  palpitation or syncope. She has iron deficiency anemia and her hemoglobin is normal   Coronary angiography 04/29/2018 showed mild to moderate diffuse CAD most severe lesion 70% eccentric stenosis in a small caliber codominant right coronary artery treated medically.  LEFT HEART CATH AND CORONARY ANGIOGRAPHY   Conclusion There is hyperdynamic left ventricular systolic function. LV end diastolic pressure is normal. The left ventricular ejection fraction is greater than 65% by visual estimate. There is no aortic valve stenosis. Mid Cx lesion is 45% stenosed. Ost Cx to Prox Cx lesion is 20% stenosed. Prox LAD to Mid LAD lesion is 40% stenosed. Mid RCA lesion is 65% stenosed. Ost LM to Prox LAD lesion is 5% stenosed.   SUMMARY Mild-Moderate diffuse CAD consistent with diabetic coronary arteries. Most significant lesion being a 70% eccentric lesion in the relatively small caliber codominant RCA (recommend medical management, not positive by CT FFR) Suspect abnormal coronary CTA FFR result was related to diffuse mild disease as opposed to any specific significant lesion. Normal LVEDP with hyperdynamic left ventricle.   RECOMMENDATION Optimize medical management -consider adding long-acting nitrate versus Ranexa. Aggressive cardiovascular risk modification. Recommend Aspirin 75m daily for moderate CAD.   Past Medical History:  Diagnosis Date   Arthritis    Carotid artery calcification    Complication of anesthesia    pt. woke up during hand surgery and colonoscopy, cataract surgery   Coronary artery disease    Diabetes mellitus without complication ([facility]    type 2  dx 10 yrs ago   Dysrhythmia    BENIGN PVC'S   GERD (gastroesophageal reflux disease)    Headache    History of OCD (obsessive compulsive disorder)    TO A DEGREE   Hypertension    Insomnia    Mixed hyperlipidemia    Nonalcoholic steatohepatitis (NASH)    DX IN 1995   Osteoarthritis    Pneumonia    walking  pneu. in the past   PONV (postoperative nausea and vomiting)     Past Surgical History:  Procedure Laterality Date   ANTERIOR CERVICAL DECOMP/DISCECTOMY FUSION  07/19/2017   Procedure: ANTERIOR CERVICAL DECOMPRESSION/DISCECTOMY FUSION , INTERBODY PROSTHESIS, ANTERIOR PLATE CERVICAL SIX- CERVICAL SEVEN, CERVICAL SEVEN- THORACIC ONE, EXPLORE OLD FUSION;  Surgeon: Newman Pies, MD;  Location: [facility];  Service: Neurosurgery;;   BACK SURGERY     x3   BUNIONECTOMY     RIGHT   CARDIAC CATHETERIZATION     CARPAL TUNNEL RELEASE     RIGHT   CHOLECYSTECTOMY     GANGLION CYST EXCISION Right    LEFT HEART CATH AND CORONARY ANGIOGRAPHY N/A 04/27/2018   Procedure: LEFT HEART CATH AND CORONARY ANGIOGRAPHY;  Surgeon: Leonie Man, MD;  Location: [facility];  Service: Cardiovascular;  Laterality: N/A;   MYELOGRAM     SHOULDER ARTHROSCOPY W/ ROTATOR CUFF REPAIR     4 SCOPES.Marland KitchenMarland KitchenALL ON THE RIGHT   TUBAL LIGATION     WOUND EXPLORATION  03/19/2015   CERVICAL WOUND    WOUND EXPLORATION N/A 03/19/2015   Procedure: CERVICAL WOUND EXPLORATION, EVACUATION OF CERVICAL HEMATOMA;  Surgeon: Newman Pies, MD;  Location: [facility];  Service: Neurosurgery;  Laterality: N/A;  cervical wound exploration, evacuation of cervical hematoma    Current Medications: Current Meds  Medication Sig   aspirin 81 MG EC tablet Take 81 mg by mouth daily.   atorvastatin (LIPITOR) 80 MG tablet Take 1 tablet (80 mg total) by mouth every evening.   cetirizine (ZYRTEC) 10 MG tablet Take 10 mg by mouth daily as needed for allergies.   Cholecalciferol (VITAMIN D3) 5000 units CAPS Take 5,000 Units by mouth daily.   cyclobenzaprine (FLEXERIL) 10 MG tablet Take 1 tablet (10 mg total) by mouth 3 (three) times daily as needed for muscle spasms.   diltiazem (CARDIZEM CD) 240 MG 24 hr capsule TAKE 1 CAPSULE BY MOUTH DAILY   docusate sodium (COLACE) 100 MG capsule Take 1 capsule (100 mg total) by mouth 2 (two) times daily.    ezetimibe (ZETIA) 10 MG tablet Take 1 tablet (10 mg total) by mouth every evening.   glucose blood test strip 1 strip by Misc.(Non-Drug; Combo Route) route daily.   isosorbide mononitrate (IMDUR) 60 MG 24 hr tablet Take 1 tablet (60 mg total) by mouth daily.   losartan (COZAAR) 50 MG tablet Take 50 mg by mouth daily as needed (Blood Pressure is greater than 160 on top).   metFORMIN (GLUCOPHAGE) 500 MG tablet Take 500 mg by mouth 2 (two) times daily with a meal.   Multiple Vitamins-Minerals (MULTIVITAMIN WITH MINERALS) tablet Take 1 tablet by mouth daily.   nitroGLYCERIN (NITROSTAT) 0.4 MG SL tablet Place 0.4 mg under the tongue every 5 (five) minutes as needed for chest pain.   omeprazole (PRILOSEC) 40 MG capsule Take 40 mg by mouth every morning.    ranolazine (RANEXA) 500 MG 12 hr tablet Take 1 tablet (500 mg total) by mouth 2 (two) times daily.   sertraline (ZOLOFT)  100 MG tablet Take 100 mg by mouth at bedtime.   zolpidem (AMBIEN) 10 MG tablet Take 10 mg by mouth at bedtime as needed for sleep.     Allergies:   Codeine, Lisinopril, Hydrocodone bit-homatrop mbr, and Tape   Social History   Socioeconomic History   Marital status: Divorced    Spouse name: Not on file   Number of children: Not on file   Years of education: Not on file   Highest education level: Not on file  Occupational History   Not on file  Tobacco Use   Smoking status: Former    Packs/day: 2.00    Years: 17.00    Pack years: 34.00    Types: Cigarettes   Smokeless tobacco: Never   Tobacco comments:    quit when she was 66  Vaping Use   Vaping Use: Never used  Substance and Sexual Activity   Alcohol use: Yes    Comment: OCCASIONAL  WINE   Drug use: No   Sexual activity: Not on file  Other Topics Concern   Not on file  Social History Narrative   Not on file   Social Determinants of Health   Financial Resource Strain: Not on file  Food Insecurity: Not on file  Transportation Needs: Not on file   Physical Activity: Not on file  Stress: Not on file  Social Connections: Not on file     Family History: The patient's family history includes Breast cancer in her mother; Heart Problems in her father; Heart failure in her paternal grandfather and paternal grandmother. ROS:   Please see the history of present illness.    All other systems reviewed and are negative.  EKGs/Labs/Other Studies Reviewed:    The following studies were reviewed today:  EKG:  EKG ordered today and personally reviewed.  The ekg ordered today demonstrates sinus rhythm normal EKG  Recent Labs: 08/29/2020: BUN 19; Creatinine, Ser 0.95; Hemoglobin 9.6; Platelets 266; Potassium 3.9; Sodium 141  Recent Lipid Panel No results found for: CHOL, TRIG, HDL, CHOLHDL, VLDL, LDLCALC, LDLDIRECT  Physical Exam:    VS:  BP (!) 110/58 (BP Location: Right Arm, Patient Position: Sitting)   Pulse 77   Ht 5' 4"  (1.626 m)   Wt 158 lb (71.7 kg)   SpO2 97%   BMI 27.12 kg/m     Wt Readings from Last 3 Encounters:  04/03/21 158 lb (71.7 kg)  08/29/20 152 lb 3.2 oz (69 kg)  08/28/20 154 lb 3.2 oz (69.9 kg)     GEN:  Well nourished, well developed in no acute distress HEENT: Normal NECK: No JVD; No carotid bruits LYMPHATICS: No lymphadenopathy CARDIAC: RRR, no murmurs, rubs, gallops RESPIRATORY:  Clear to auscultation without rales, wheezing or rhonchi  ABDOMEN: Soft, non-tender, non-distended MUSCULOSKELETAL:  No edema; No deformity  SKIN: Warm and dry NEUROLOGIC:  Alert and oriented x 3 PSYCHIATRIC:  Normal affect    Signed, Shirlee More, MD  04/03/2021 2:46 PM    [facility]

## 2021-04-03 ENCOUNTER — Encounter: Payer: Self-pay | Admitting: Cardiology

## 2021-04-03 ENCOUNTER — Ambulatory Visit: Payer: PPO | Admitting: Cardiology

## 2021-04-03 ENCOUNTER — Other Ambulatory Visit: Payer: Self-pay

## 2021-04-03 VITALS — BP 110/58 | HR 77 | Ht 64.0 in | Wt 158.0 lb

## 2021-04-03 DIAGNOSIS — I10 Essential (primary) hypertension: Secondary | ICD-10-CM | POA: Diagnosis not present

## 2021-04-03 DIAGNOSIS — I2510 Atherosclerotic heart disease of native coronary artery without angina pectoris: Secondary | ICD-10-CM | POA: Diagnosis not present

## 2021-04-03 DIAGNOSIS — E785 Hyperlipidemia, unspecified: Secondary | ICD-10-CM | POA: Insufficient documentation

## 2021-04-03 DIAGNOSIS — E782 Mixed hyperlipidemia: Secondary | ICD-10-CM | POA: Diagnosis not present

## 2021-04-03 NOTE — Patient Instructions (Signed)
Medication Instructions:  Your physician has recommended you make the following change in your medication:  STOP: Atorvastatin  *If you need a refill on your cardiac medications before your next appointment, please call your pharmacy*   Lab Work: Your physician recommends that you return for lab work in: 1 month  Lipids If you have labs (blood work) drawn today and your tests are completely normal, you will receive your results only by: [facility] (if you have [place]) OR A paper copy in the mail If you have any lab test that is abnormal or we need to change your treatment, we will call you to review the results.   Testing/Procedures: None   Follow-Up: At [facility], you and your health needs are our priority.  As part of our continuing mission to provide you with exceptional heart care, we have created designated Provider Care Teams.  These Care Teams include your primary Cardiologist (physician) and Advanced Practice Providers (APPs -  Physician Assistants and Nurse Practitioners) who all work together to provide you with the care you need, when you need it.  We recommend signing up for the patient portal called "MyChart".  Sign up information is provided on this After Visit Summary.  MyChart is used to connect with patients for Virtual Visits (Telemedicine).  Patients are able to view lab/test results, encounter notes, upcoming appointments, etc.  Non-urgent messages can be sent to your provider as well.   To learn more about what you can do with MyChart, go to [email or web address].    Your next appointment:   6 month(s)  The format for your next appointment:   In Person  Provider:   Shirlee More, MD   Other Instructions

## 2021-04-07 DIAGNOSIS — D509 Iron deficiency anemia, unspecified: Secondary | ICD-10-CM | POA: Diagnosis not present

## 2021-04-11 DIAGNOSIS — Z6827 Body mass index (BMI) 27.0-27.9, adult: Secondary | ICD-10-CM | POA: Diagnosis not present

## 2021-04-11 DIAGNOSIS — M5134 Other intervertebral disc degeneration, thoracic region: Secondary | ICD-10-CM | POA: Diagnosis not present

## 2021-04-11 DIAGNOSIS — M5124 Other intervertebral disc displacement, thoracic region: Secondary | ICD-10-CM | POA: Diagnosis not present

## 2021-04-14 DIAGNOSIS — N281 Cyst of kidney, acquired: Secondary | ICD-10-CM | POA: Insufficient documentation

## 2021-04-27 IMAGING — MG MM DIGITAL SCREENING BILAT W/ TOMO AND CAD
8 series · 9 of 24 positions shown · non-contrast
Comparison: Previous exam(s).

CLINICAL DATA: Screening.

EXAM:
DIGITAL SCREENING BILATERAL MAMMOGRAM WITH TOMOSYNTHESIS AND CAD
TECHNIQUE: Bilateral screening digital craniocaudal and mediolateral oblique
mammograms were obtained. Bilateral screening digital breast
tomosynthesis was performed. The images were evaluated with
computer-aided detection.

[R CC synth-2D]
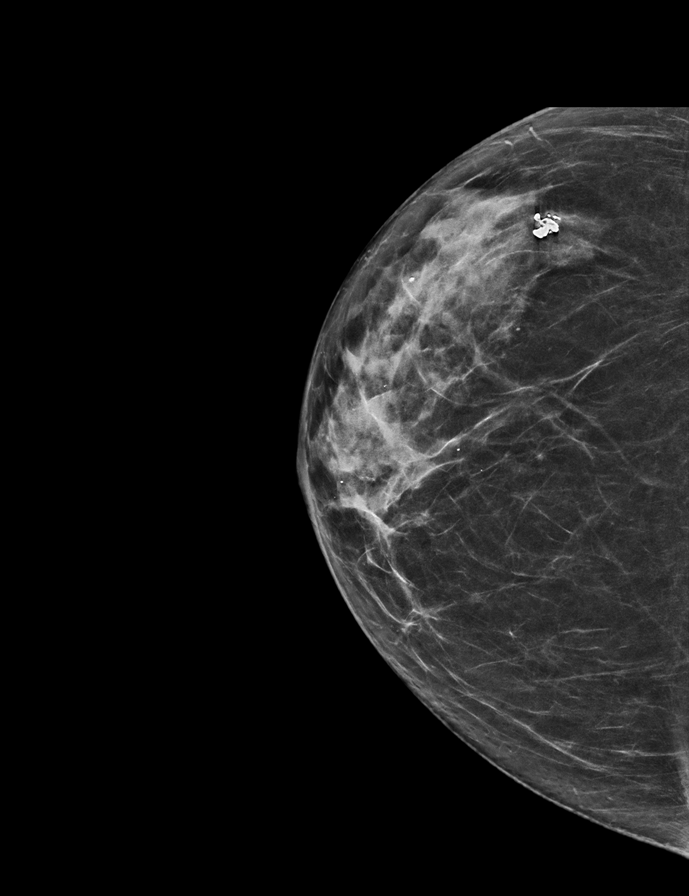

[L CC synth-2D]
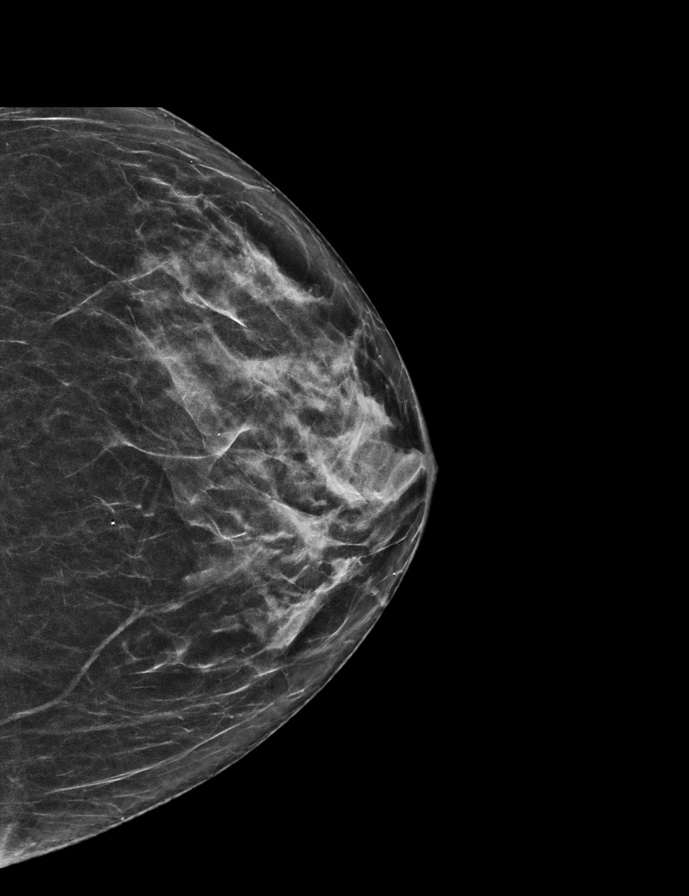

[L MLO synth-2D]
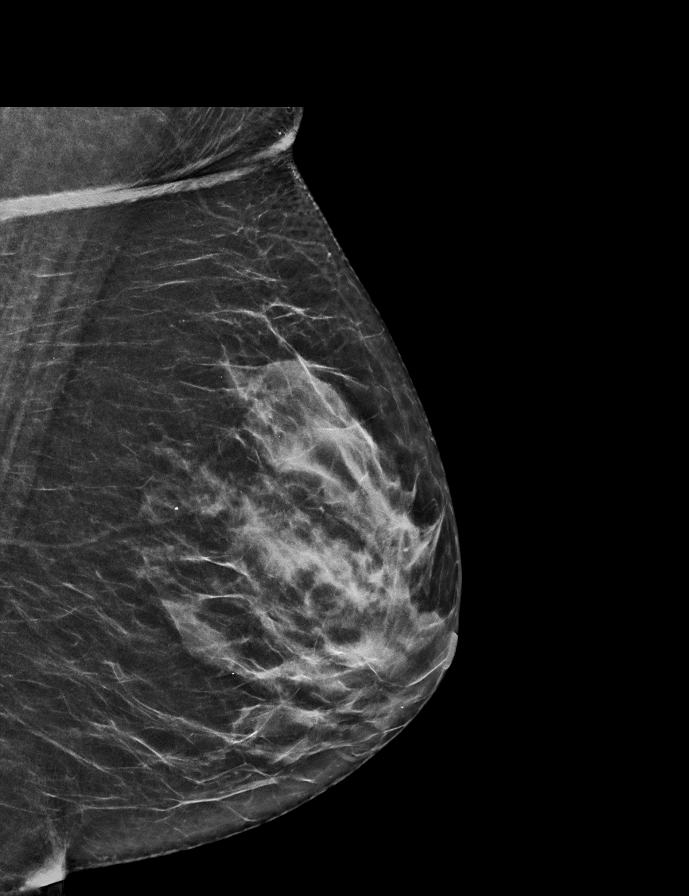

[R MLO synth-2D]
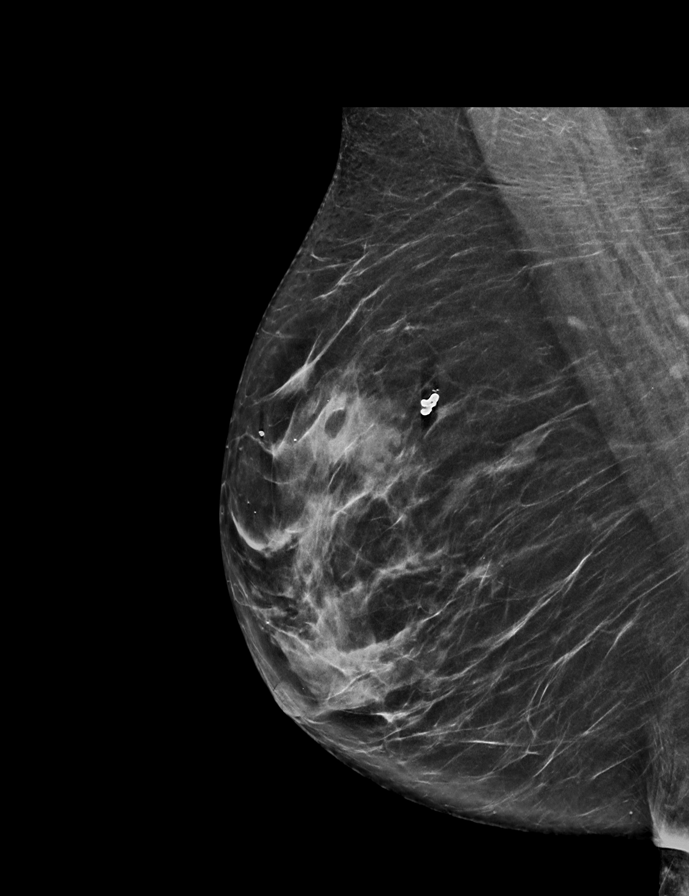

[R MLO tomo · 2 of 66 frames shown]
[frame 22/66]
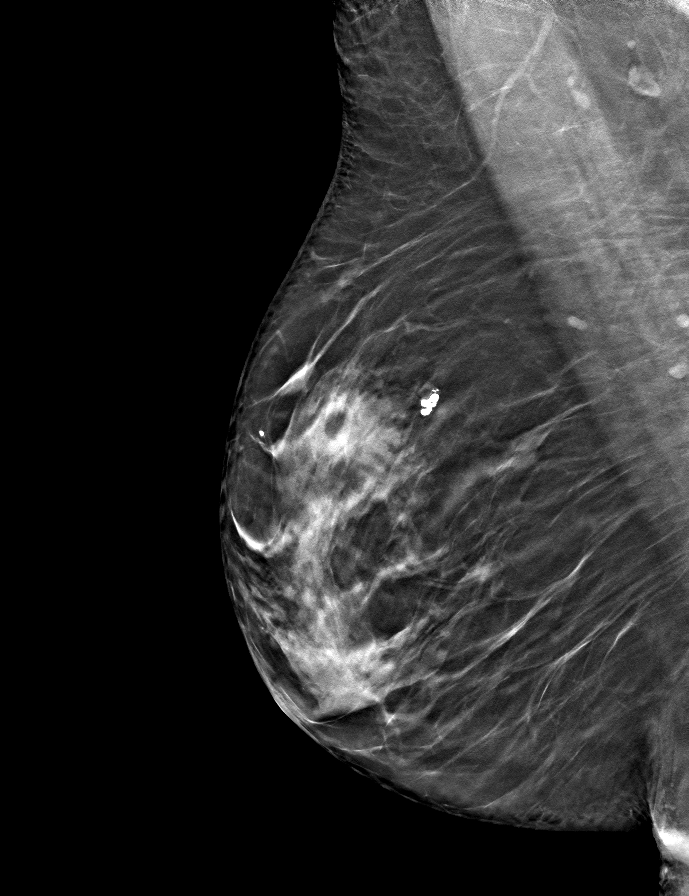
[frame 33/66]
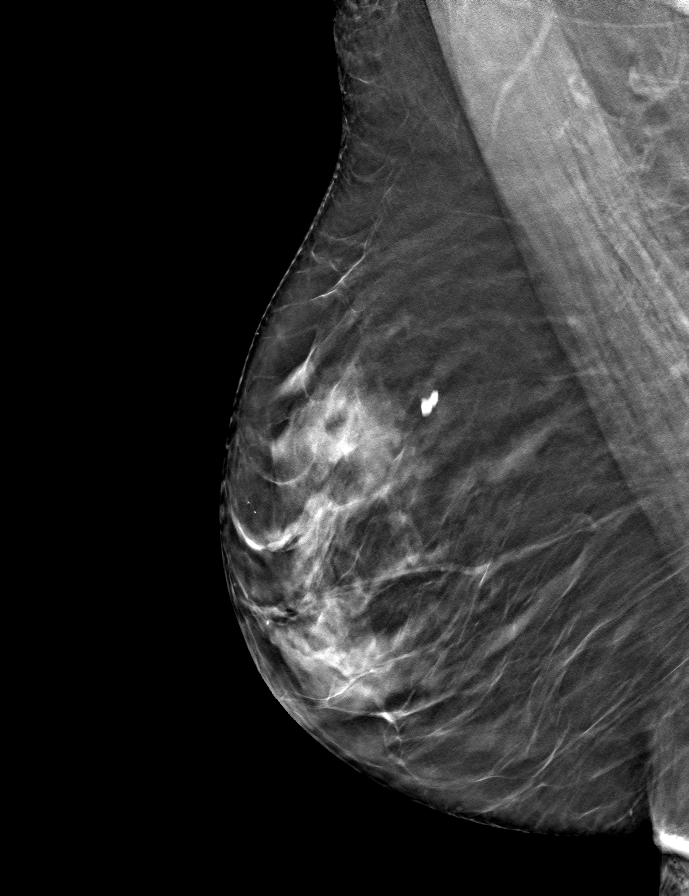

[L MLO tomo · tomo slice 30/59.0]
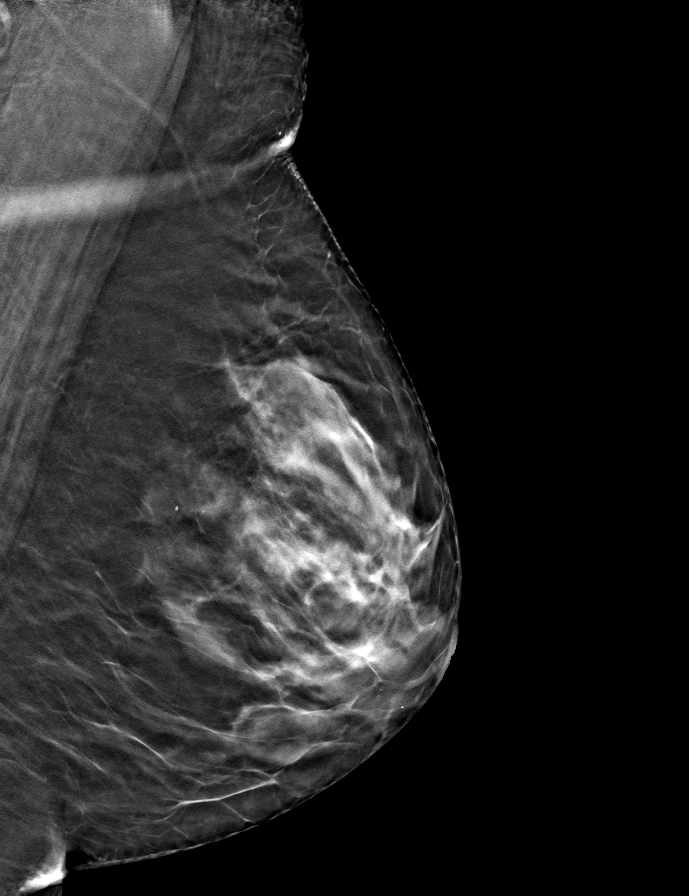

[R CC tomo · tomo slice 31/61.0]
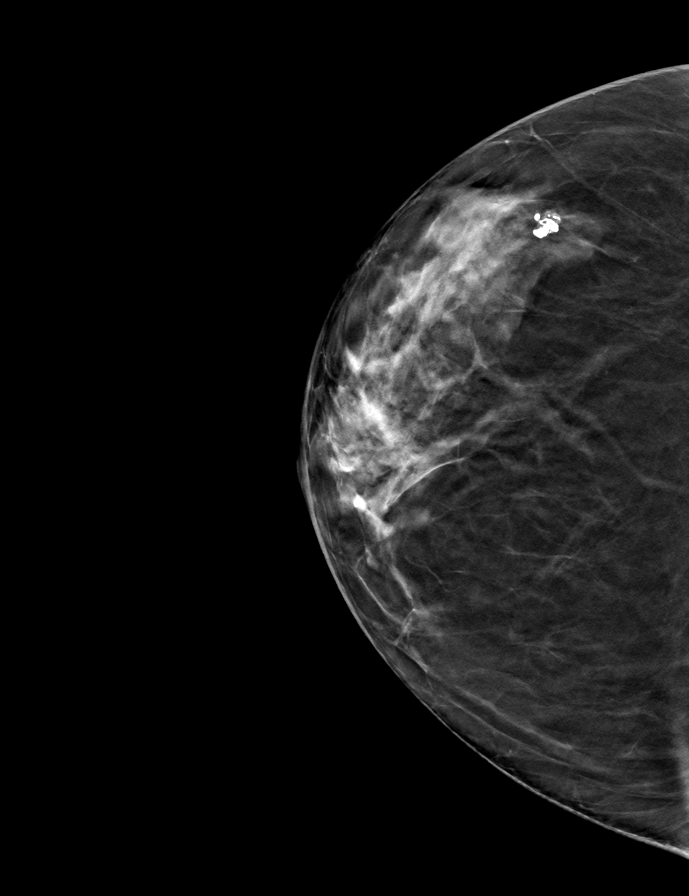

[L CC tomo · tomo slice 28/55.0]
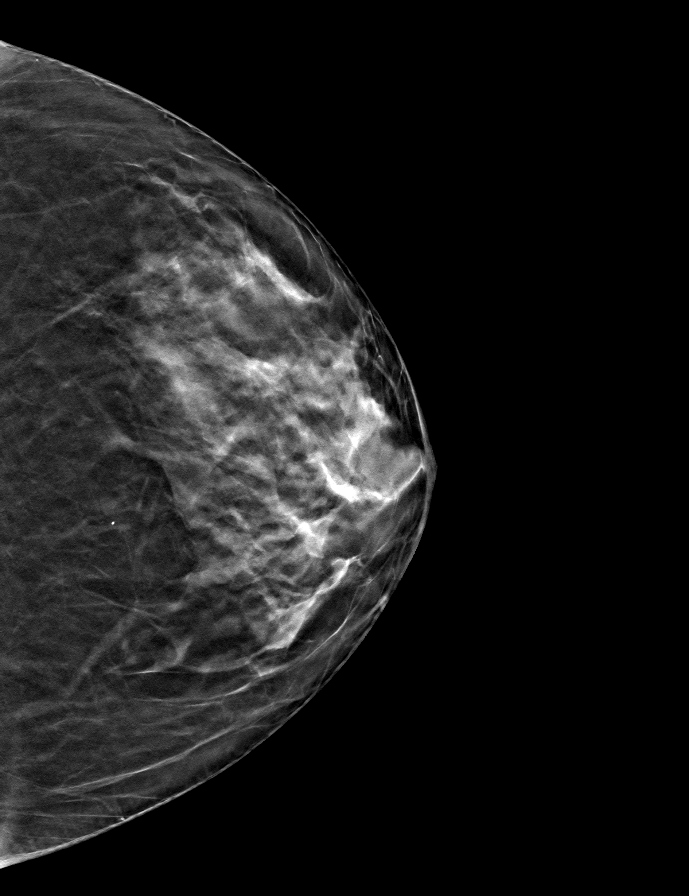

[9 of 24 positions shown; findings below may reference images not displayed]

ACR Breast Density Category c: The breast tissue is heterogeneously
dense, which may obscure small masses.
FINDINGS: There are no findings suspicious for malignancy. The images were
evaluated with computer-aided detection.
IMPRESSION: No mammographic evidence of malignancy. A result letter of this
screening mammogram will be mailed directly to the patient.

RECOMMENDATION:
Screening mammogram in one year. (Code:T4-5-GWO)

BI-RADS CATEGORY  1: Negative.

## 2021-05-02 ENCOUNTER — Other Ambulatory Visit: Payer: Self-pay | Admitting: Neurosurgery

## 2021-05-12 DIAGNOSIS — N281 Cyst of kidney, acquired: Secondary | ICD-10-CM | POA: Diagnosis not present

## 2021-05-12 DIAGNOSIS — N3281 Overactive bladder: Secondary | ICD-10-CM | POA: Diagnosis not present

## 2021-05-19 DIAGNOSIS — K5730 Diverticulosis of large intestine without perforation or abscess without bleeding: Secondary | ICD-10-CM | POA: Diagnosis not present

## 2021-05-19 DIAGNOSIS — Z9049 Acquired absence of other specified parts of digestive tract: Secondary | ICD-10-CM | POA: Diagnosis not present

## 2021-05-19 DIAGNOSIS — N281 Cyst of kidney, acquired: Secondary | ICD-10-CM | POA: Diagnosis not present

## 2021-05-19 DIAGNOSIS — Z981 Arthrodesis status: Secondary | ICD-10-CM | POA: Diagnosis not present

## 2021-05-22 ENCOUNTER — Other Ambulatory Visit: Payer: Self-pay | Admitting: Neurosurgery

## 2021-05-23 DIAGNOSIS — Z131 Encounter for screening for diabetes mellitus: Secondary | ICD-10-CM | POA: Diagnosis not present

## 2021-05-23 NOTE — Progress Notes (Signed)
Surgical Instructions    Your procedure is scheduled on 05/28/21.  Report to [facility] Main Entrance "A" at 11:00 A.M., then check in with the Admitting office.  Call this number if you have problems the morning of surgery:  [phone]   If you have any questions prior to your surgery date call [phone]: Open Monday-Friday 8am-4pm    Remember:  Do not eat or drink after midnight the night before your surgery      Take these medicines the morning of surgery with A SIP OF WATER  diltiazem (CARDIZEM CD)  docusate sodium (COLACE)  hydroxypropyl methylcellulose / hypromellose (ISOPTO TEARS / GONIOVISC) if needed isosorbide mononitrate (IMDUR)  mirabegron ER (MYRBETRIQ) omeprazole (PRILOSEC) ranolazine (RANEXA)  As of today, STOP taking any Aspirin (unless otherwise instructed by your surgeon) Aleve, Naproxen, Ibuprofen, Motrin, Advil, Goody's, BC's, all herbal medications, fish oil, and all vitamins.  WHAT DO I DO ABOUT MY DIABETES MEDICATION?   Do not take oral diabetes medicines (pills) the morning of surgery.     THE MORNING OF SURGERY, do not take metFORMIN (GLUCOPHAGE).  The day of surgery, do not take other diabetes injectables, including Byetta (exenatide), Bydureon (exenatide ER), Victoza (liraglutide), or Trulicity (dulaglutide).  If your CBG is greater than 220 mg/dL, you may take  of your sliding scale (correction) dose of insulin.   HOW TO MANAGE YOUR DIABETES BEFORE AND AFTER SURGERY  Why is it important to control my blood sugar before and after surgery? Improving blood sugar levels before and after surgery helps healing and can limit problems. A way of improving blood sugar control is eating a healthy diet by:  Eating less sugar and carbohydrates  Increasing activity/exercise  Talking with your doctor about reaching your blood sugar goals High blood sugars (greater than 180 mg/dL) can raise your risk of infections and slow your recovery, so you will  need to focus on controlling your diabetes during the weeks before surgery. Make sure that the doctor who takes care of your diabetes knows about your planned surgery including the date and location.  How do I manage my blood sugar before surgery? Check your blood sugar at least 4 times a day, starting 2 days before surgery, to make sure that the level is not too high or low.  Check your blood sugar the morning of your surgery when you wake up and every 2 hours until you get to the Short Stay unit.  If your blood sugar is less than 70 mg/dL, you will need to treat for low blood sugar: Do not take insulin. Treat a low blood sugar (less than 70 mg/dL) with  cup of clear juice (cranberry or apple), 4 glucose tablets, OR glucose gel. Recheck blood sugar in 15 minutes after treatment (to make sure it is greater than 70 mg/dL). If your blood sugar is not greater than 70 mg/dL on recheck, call [phone] for further instructions. Report your blood sugar to the short stay nurse when you get to Short Stay.  If you are admitted to the hospital after surgery: Your blood sugar will be checked by the staff and you will probably be given insulin after surgery (instead of oral diabetes medicines) to make sure you have good blood sugar levels. The goal for blood sugar control after surgery is 80-180 mg/dL.'  After your COVID test   You are not required to quarantine however you are required to wear a well-fitting mask when you are out and around people not in  your household.  If your mask becomes wet or soiled, replace with a new one.  Wash your hands often with soap and water for 20 seconds or clean your hands with an alcohol-based hand sanitizer that contains at least 60% alcohol.  Do not share personal items.  Notify your provider: if you are in close contact with someone who has COVID  or if you develop a fever of 100.4 or greater, sneezing, cough, sore throat, shortness of breath or body  aches.             Do not wear jewelry or makeup Do not wear lotions, powders, perfumes/colognes, or deodorant. Do not shave 48 hours prior to surgery.   Do not bring valuables to the hospital. DO Not wear nail polish, gel polish, artificial nails, or any other type of covering on natural nails including finger and toenails. If patients have artificial nails, gel coating, etc. that need to be removed by a nail salon, please have this removed prior to surgery or surgery may need to be canceled/delayed if the surgeon/ anesthesia feels like the patient is unable to be adequately monitored.             [facility] is not responsible for any belongings or valuables.  Do NOT Smoke (Tobacco/Vaping)  24 hours prior to your procedure  If you use a CPAP at night, you may bring your mask for your overnight stay.   Contacts, glasses, hearing aids, dentures or partials may not be worn into surgery, please bring cases for these belongings   For patients admitted to the hospital, discharge time will be determined by your treatment team.   Patients discharged the day of surgery will not be allowed to drive home, and someone needs to stay with them for 24 hours.  NO VISITORS WILL BE ALLOWED IN PRE-OP WHERE PATIENTS ARE PREPPED FOR SURGERY.  ONLY 1 SUPPORT PERSON MAY BE PRESENT IN THE WAITING ROOM WHILE YOU ARE IN SURGERY.  IF YOU ARE TO BE ADMITTED, ONCE YOU ARE IN YOUR ROOM YOU WILL BE ALLOWED TWO (2) VISITORS. 1 (ONE) VISITOR MAY STAY OVERNIGHT BUT MUST ARRIVE TO THE ROOM BY 8pm.  Minor children may have two parents present. Special consideration for safety and communication needs will be reviewed on a case by case basis.  Special instructions:    Oral Hygiene is also important to reduce your risk of infection.  Remember - BRUSH YOUR TEETH THE MORNING OF SURGERY WITH YOUR REGULAR TOOTHPASTE   [facility]- Preparing For Surgery  Before surgery, you can play an important role. Because skin is not  sterile, your skin needs to be as free of germs as possible. You can reduce the number of germs on your skin by washing with CHG (chlorahexidine gluconate) Soap before surgery.  CHG is an antiseptic cleaner which kills germs and bonds with the skin to continue killing germs even after washing.     Please do not use if you have an allergy to CHG or antibacterial soaps. If your skin becomes reddened/irritated stop using the CHG.  Do not shave (including legs and underarms) for at least 48 hours prior to first CHG shower. It is OK to shave your face.  Please follow these instructions carefully.     Shower the NIGHT BEFORE SURGERY and the MORNING OF SURGERY with CHG Soap.   If you chose to wash your hair, wash your hair first as usual with your normal shampoo. After you shampoo, rinse  your hair and body thoroughly to remove the shampoo.  Then [employer] and genitals (private parts) with your normal soap and rinse thoroughly to remove soap.  After that Use CHG Soap as you would any other liquid soap. You can apply CHG directly to the skin and wash gently with a scrungie or a clean washcloth.   Apply the CHG Soap to your body ONLY FROM THE NECK DOWN.  Do not use on open wounds or open sores. Avoid contact with your eyes, ears, mouth and genitals (private parts). Wash Face and genitals (private parts)  with your normal soap.   Wash thoroughly, paying special attention to the area where your surgery will be performed.  Thoroughly rinse your body with warm water from the neck down.  DO NOT shower/wash with your normal soap after using and rinsing off the CHG Soap.  Pat yourself dry with a CLEAN TOWEL.  Wear CLEAN PAJAMAS to bed the night before surgery  Place CLEAN SHEETS on your bed the night before your surgery  DO NOT SLEEP WITH PETS.   Day of Surgery: Take a shower with CHG soap. Wear Clean/Comfortable clothing the morning of surgery Do not apply any deodorants/lotions.   Remember to  brush your teeth WITH YOUR REGULAR TOOTHPASTE.   Please read over the following fact sheets that you were given.

## 2021-05-26 ENCOUNTER — Encounter (HOSPITAL_COMMUNITY)
Admission: RE | Admit: 2021-05-26 | Discharge: 2021-05-26 | Disposition: A | Discharge status: Home / Self Care | Payer: PPO | Source: Ambulatory Visit | Attending: Neurosurgery | Admitting: Neurosurgery

## 2021-05-26 ENCOUNTER — Encounter (HOSPITAL_COMMUNITY): Payer: Self-pay

## 2021-05-26 ENCOUNTER — Other Ambulatory Visit: Payer: Self-pay

## 2021-05-26 VITALS — BP 141/69 | HR 84 | Temp 98.7°F | Resp 17 | Ht 62.0 in | Wt 159.8 lb

## 2021-05-26 DIAGNOSIS — Z01812 Encounter for preprocedural laboratory examination: Secondary | ICD-10-CM | POA: Insufficient documentation

## 2021-05-26 DIAGNOSIS — Z01818 Encounter for other preprocedural examination: Secondary | ICD-10-CM

## 2021-05-26 DIAGNOSIS — E785 Hyperlipidemia, unspecified: Secondary | ICD-10-CM | POA: Insufficient documentation

## 2021-05-26 DIAGNOSIS — E119 Type 2 diabetes mellitus without complications: Secondary | ICD-10-CM | POA: Insufficient documentation

## 2021-05-26 DIAGNOSIS — Z20822 Contact with and (suspected) exposure to covid-19: Secondary | ICD-10-CM | POA: Insufficient documentation

## 2021-05-26 DIAGNOSIS — I10 Essential (primary) hypertension: Secondary | ICD-10-CM | POA: Insufficient documentation

## 2021-05-26 DIAGNOSIS — I2510 Atherosclerotic heart disease of native coronary artery without angina pectoris: Secondary | ICD-10-CM | POA: Insufficient documentation

## 2021-05-26 LAB — BASIC METABOLIC PANEL
Anion gap: 11 (ref 5–15)
BUN: 17 mg/dL (ref 8–23)
CO2: 25 mmol/L (ref 22–32)
Calcium: 9.3 mg/dL (ref 8.9–10.3)
Chloride: 102 mmol/L (ref 98–111)
Creatinine, Ser: 0.88 mg/dL (ref 0.44–1.00)
GFR, Estimated: >60 mL/min (ref >60)
Glucose, Bld: 115 mg/dL — ABNORMAL HIGH (ref 70–99)
Potassium: 4 mmol/L (ref 3.5–5.1)
Sodium: 138 mmol/L (ref 135–145)

## 2021-05-26 LAB — SURGICAL PCR SCREEN
MRSA, PCR: NEGATIVE
Staphylococcus aureus: NEGATIVE

## 2021-05-26 LAB — SARS CORONAVIRUS 2 (TAT 6-24 HRS): SARS Coronavirus 2: NEGATIVE

## 2021-05-26 LAB — TYPE AND SCREEN
ABO/RH(D): A POS
Antibody Screen: NEGATIVE

## 2021-05-26 LAB — CBC
HCT: 41.4 % (ref 36.0–46.0)
Hemoglobin: 13.4 g/dL (ref 12.0–15.0)
MCH: 29.4 pg (ref 26.0–34.0)
MCHC: 32.4 g/dL (ref 30.0–36.0)
MCV: 90.8 fL (ref 80.0–100.0)
Platelets: 254 10*3/uL (ref 150–400)
RBC: 4.56 MIL/uL (ref 3.87–5.11)
RDW: 13.5 % (ref 11.5–15.5)
WBC: 5.9 10*3/uL (ref 4.0–10.5)
nRBC: 0 % (ref 0.0–0.2)

## 2021-05-26 LAB — HEMOGLOBIN A1C
Hgb A1c MFr Bld: 5.7 % — ABNORMAL HIGH (ref 4.8–5.6)
Mean Plasma Glucose: 116.89 mg/dL

## 2021-05-26 LAB — GLUCOSE, CAPILLARY: Glucose-Capillary: 111 mg/dL — ABNORMAL HIGH (ref 70–99)

## 2021-05-26 MED ORDER — LACTATED RINGERS IV SOLN: INTRAVENOUS | Status: DC

## 2021-05-26 MED ORDER — ORAL CARE MOUTH RINSE: 15.0000 mL | Freq: Once | OROMUCOSAL | Status: DC

## 2021-05-26 MED ORDER — CHLORHEXIDINE GLUCONATE 0.12 % MT SOLN: 15.0000 mL | Freq: Once | OROMUCOSAL | Status: DC

## 2021-05-26 NOTE — Progress Notes (Signed)
PCP - Elaine Roach Cardiologist - brian munley   PPM/ICD - denies   Chest x-ray - n/a EKG - 04/03/21 Stress Test - over 10 years ago ECHO - denies Cardiac Cath - 04/27/18  Sleep Study - denies  Does not check CBG at home  As of today, STOP taking any Aspirin (unless otherwise instructed by your surgeon) Aleve, Naproxen, Ibuprofen, Motrin, Advil, Goody's, BC's, all herbal medications, fish oil, and all vitamins.  ERAS Protcol -no   COVID TEST- done in PAT 12/12/2   Anesthesia review: yes, cardiac history  Patient denies shortness of breath, fever, cough and chest pain at PAT appointment   All instructions explained to the patient, with a verbal understanding of the material. Patient agrees to go over the instructions while at home for a better understanding. Patient also instructed to self quarantine after being tested for COVID-19. The opportunity to ask questions was provided.

## 2021-05-27 NOTE — Progress Notes (Signed)
Anesthesia Chart Review:  Follows with cardiology for hx of CAD (mild-mod diffuse by cath 2019), HLD, HTN. Last seen by Dr. Bettina Gavia 04/03/21 and doing well from CV perspective. Per note, " 70 y.o. female with a hx of mild CAD diabetes hypertension hyperlipidemia and orthostatic hypotension.  She was last seen 08/28/2020 doing well having no angina after the initiation of ranolazine treatment.Marland KitchenMarland KitchenShe has had 1 episode of nocturnal angina relieved with nitroglycerin.  Otherwise no chest pain edema shortness of breath palpitation or syncope." It was discussed that she would be seeing neurosurgery for ongoing back issues.   Underwent exploration of thoracic fusion 2/40/97 without complication.   Preop labs reviewed, unremarkable. DM2 well controlled, A1c 5.7.  EKG 04/03/21: NSR. Rate 77.  Carotid duplex 01/30/20: Summary:  Right Carotid: There is no evidence of stenosis in the right ICA.  Left Carotid: Velocities in the left ICA are consistent with a 40-59% stenosis.  Vertebrals:  Bilateral vertebral arteries demonstrate antegrade flow.  Subclavians: Normal flow hemodynamics were seen in bilateral subclavian arteries.   Event monitor 01/11/20: ZIO monitor was performed for 4 days beginning 01/11/2020 in order to assess palpitation. The cardiac rhythm throughout was sinus with minimum average and maximum heart rates of 60, 78 and 126 bpm. There were no pauses of 3 seconds or greater and no episodes of second or third-degree AV nodal block or sinus node exit block. There were 6 triggered events 1 was associated with rare APC 2 were associated with rare PVCs. Ventricular ectopy was rare with isolated PVCs. Supraventricular ectopy is rare without episodes of atrial fibrillation or flutter. Conclusion unremarkable event monitor however 3 the triggered events were associated with isolated PVCs or APCs.  Cath 04/27/2018: Conclusion ? There is hyperdynamic left ventricular systolic function. ? LV end diastolic  pressure is normal. ? The left ventricular ejection fraction is greater than 65% by visual estimate. ? There is no aortic valve stenosis. ? Mid Cx lesion is 45% stenosed. ? Ost Cx to Prox Cx lesion is 20% stenosed. ? Prox LAD to Mid LAD lesion is 40% stenosed. ? Mid RCA lesion is 65% stenosed. ? Ost LM to Prox LAD lesion is 5% stenosed.   SUMMARY ? Mild-Moderate diffuse CAD consistent with diabetic coronary arteries. ? Most significant lesion being a 70% eccentric lesion in the relatively small caliber codominant RCA (recommend medical management, not positive by CT FFR) ? Suspect abnormal coronary CTA FFR result was related to diffuse mild disease as opposed to any specific significant lesion. ? Normal LVEDP with hyperdynamic left ventricle.   RECOMMENDATION ? Optimize medical management -consider adding long-acting nitrate versus Ranexa. ? Aggressive cardiovascular risk modification. ? Recommend Aspirin 46m daily for moderate CAD.       JWynonia MustyMRancho Mirage Surgery CenterShort Stay Center/Anesthesiology Phone [phone]/13/2022 11:16 AM

## 2021-05-27 NOTE — Anesthesia Preprocedure Evaluation (Addendum)
Anesthesia Evaluation  Patient identified by MRN, date of birth, ID band Patient awake    Reviewed: Allergy & Precautions, NPO status , Patient's Chart, lab work & pertinent test results  History of Anesthesia Complications (+) PONV  Airway Mallampati: II  TM Distance: >3 FB Neck ROM: Full    Dental  (+) Missing, Chipped, Dental Advisory Given, Poor Dentition   Pulmonary former smoker,  05/26/2021 SARS coronavirus NEG   breath sounds clear to auscultation       Cardiovascular hypertension, Pt. on medications (-) angina+ CAD ('19 cath)  + dysrhythmias  Rhythm:Regular Rate:Normal  '19 cath: Mild-Moderate diffuse CAD consistent with diabetic coronary arteries. Most significant lesion being a 70% eccentric lesion in the relatively small caliber codominant RCA (recommend medical management)    Neuro/Psych  Headaches, PSYCHIATRIC DISORDERS (OCD) Anxiety    GI/Hepatic GERD  Medicated and Controlled,NASH   Endo/Other  diabetes (glu 116), Oral Hypoglycemic Agents  Renal/GU negative Renal ROS     Musculoskeletal  (+) Arthritis ,   Abdominal   Peds  Hematology negative hematology ROS (+)   Anesthesia Other Findings   Reproductive/Obstetrics                           Anesthesia Physical Anesthesia Plan  ASA: 3  Anesthesia Plan: General   Post-op Pain Management: Dilaudid IV   Induction:   PONV Risk Score and Plan: 4 or greater and Ondansetron, Dexamethasone, Treatment may vary due to age or medical condition and Aprepitant  Airway Management Planned: Oral ETT  Additional Equipment: None  Intra-op Plan:   Post-operative Plan: Extubation in OR  Informed Consent: I have reviewed the patients History and Physical, chart, labs and discussed the procedure including the risks, benefits and alternatives for the proposed anesthesia with the patient or authorized representative who has indicated  his/her understanding and acceptance.     Dental advisory given  Plan Discussed with: CRNA and Surgeon  Anesthesia Plan Comments: (PAT note by Karoline Caldwell, PA-C:  Follows with cardiology for hx of CAD (mild-mod diffuse by cath 2019), HLD, HTN. Last seen by Dr. Bettina Gavia 04/03/21 and doing well from CV perspective. Per note, "70 y.o.femalewith a hx of mild CAD diabetes hypertension hyperlipidemia and orthostatic hypotension. She was last seen03/16/2022doing well having no angina after the initiation of ranolazine treatment.Marland KitchenMarland KitchenShe has had 1 episode of nocturnal angina relieved with nitroglycerin. Otherwise no chest pain edema shortness of breath palpitation or syncope." It was discussed that she would be seeing neurosurgery for ongoing back issues.   Underwent exploration of thoracic fusion 9/38/18 without complication.   Preop labs reviewed, unremarkable. DM2 well controlled, A1c 5.7.  EKG 04/03/21: NSR. Rate 77.  Carotid duplex 01/30/20: Summary:  Right Carotid: There is no evidence of stenosis in the right ICA.  Left Carotid: Velocities in the left ICA are consistent with a 40-59% stenosis.  Vertebrals: Bilateral vertebral arteries demonstrate antegrade flow.  Subclavians: Normal flow hemodynamics were seen in bilateral subclavian arteries.   Event monitor 01/11/20: ZIO monitor was performed for 4 days beginning 01/11/2020 in order to assess palpitation. The cardiac rhythm throughout was sinus with minimum average and maximum heart rates of 60, 78 and 126 bpm. There were no pauses of 3 seconds or greater and no episodes of second or third-degree AV nodal block or sinus node exit block. There were 6 triggered events 1 was associated with rare APC 2 were associated with rare PVCs. Ventricular ectopy  was rare with isolated PVCs. Supraventricular ectopy is rare without episodes of atrial fibrillation or flutter. Conclusion unremarkable event monitor however 3 the triggered events were  associated with isolated PVCs or APCs.  Cath 04/27/2018: Conclusion ? There is hyperdynamic left ventricular systolic function. ? LV end diastolic pressure is normal. ? The left ventricular ejection fraction is greater than 65% by visual estimate. ? There is no aortic valve stenosis. ? Mid Cx lesion is 45% stenosed. ? Ost Cx to Prox Cx lesion is 20% stenosed. ? Prox LAD to Mid LAD lesion is 40% stenosed. ? Mid RCA lesion is 65% stenosed. ? Ost LM to Prox LAD lesion is 5% stenosed.  SUMMARY ? Mild-Moderate diffuse CAD consistent with diabetic coronary arteries. ? Most significant lesion being a 70% eccentric lesion in the relatively small caliber codominant RCA (recommend medical management, not positive by CT FFR) ? Suspect abnormal coronary CTA FFR result was related to diffuse mild disease as opposed to any specific significant lesion. ? Normal LVEDP with hyperdynamic left ventricle.  RECOMMENDATION ? Optimize medical management -consider adding long-acting nitrate versus Ranexa. ? Aggressive cardiovascular risk modification. ? Recommend Aspirin 49m daily for moderate CAD.  )      Anesthesia Quick Evaluation

## 2021-05-28 ENCOUNTER — Other Ambulatory Visit: Payer: Self-pay

## 2021-05-28 ENCOUNTER — Inpatient Hospital Stay (HOSPITAL_COMMUNITY): Payer: PPO | Admitting: Certified Registered"

## 2021-05-28 ENCOUNTER — Encounter (HOSPITAL_COMMUNITY): Payer: Self-pay | Admitting: Neurosurgery

## 2021-05-28 ENCOUNTER — Inpatient Hospital Stay (HOSPITAL_COMMUNITY)
Admission: RE | Admit: 2021-05-28 | Discharge: 2021-05-30 | DRG: 460 | Disposition: A | Discharge status: Home / Self Care | Payer: PPO | Attending: Neurosurgery | Admitting: Neurosurgery

## 2021-05-28 ENCOUNTER — Inpatient Hospital Stay (HOSPITAL_COMMUNITY): Payer: PPO | Admitting: Vascular Surgery

## 2021-05-28 ENCOUNTER — Inpatient Hospital Stay (HOSPITAL_COMMUNITY): Payer: PPO

## 2021-05-28 ENCOUNTER — Encounter (HOSPITAL_COMMUNITY)
Admission: RE | Disposition: A | Discharge status: Home / Self Care | Payer: Self-pay | Source: Home / Self Care | Attending: Neurosurgery

## 2021-05-28 DIAGNOSIS — Z91048 Other nonmedicinal substance allergy status: Secondary | ICD-10-CM | POA: Diagnosis not present

## 2021-05-28 DIAGNOSIS — Z885 Allergy status to narcotic agent status: Secondary | ICD-10-CM

## 2021-05-28 DIAGNOSIS — M960 Pseudarthrosis after fusion or arthrodesis: Secondary | ICD-10-CM | POA: Diagnosis present

## 2021-05-28 DIAGNOSIS — Z9851 Tubal ligation status: Secondary | ICD-10-CM | POA: Diagnosis not present

## 2021-05-28 DIAGNOSIS — K219 Gastro-esophageal reflux disease without esophagitis: Secondary | ICD-10-CM | POA: Diagnosis not present

## 2021-05-28 DIAGNOSIS — Z7984 Long term (current) use of oral hypoglycemic drugs: Secondary | ICD-10-CM

## 2021-05-28 DIAGNOSIS — I2510 Atherosclerotic heart disease of native coronary artery without angina pectoris: Secondary | ICD-10-CM | POA: Diagnosis present

## 2021-05-28 DIAGNOSIS — Z79899 Other long term (current) drug therapy: Secondary | ICD-10-CM | POA: Diagnosis not present

## 2021-05-28 DIAGNOSIS — K7581 Nonalcoholic steatohepatitis (NASH): Secondary | ICD-10-CM | POA: Diagnosis present

## 2021-05-28 DIAGNOSIS — Z87891 Personal history of nicotine dependence: Secondary | ICD-10-CM | POA: Diagnosis not present

## 2021-05-28 DIAGNOSIS — Z20822 Contact with and (suspected) exposure to covid-19: Secondary | ICD-10-CM | POA: Diagnosis not present

## 2021-05-28 DIAGNOSIS — I10 Essential (primary) hypertension: Secondary | ICD-10-CM | POA: Diagnosis not present

## 2021-05-28 DIAGNOSIS — M5124 Other intervertebral disc displacement, thoracic region: Principal | ICD-10-CM | POA: Diagnosis present

## 2021-05-28 DIAGNOSIS — Z8701 Personal history of pneumonia (recurrent): Secondary | ICD-10-CM

## 2021-05-28 DIAGNOSIS — Z8249 Family history of ischemic heart disease and other diseases of the circulatory system: Secondary | ICD-10-CM | POA: Diagnosis not present

## 2021-05-28 DIAGNOSIS — M1990 Unspecified osteoarthritis, unspecified site: Secondary | ICD-10-CM | POA: Diagnosis present

## 2021-05-28 DIAGNOSIS — Z7982 Long term (current) use of aspirin: Secondary | ICD-10-CM

## 2021-05-28 DIAGNOSIS — M5134 Other intervertebral disc degeneration, thoracic region: Secondary | ICD-10-CM | POA: Diagnosis not present

## 2021-05-28 DIAGNOSIS — Z888 Allergy status to other drugs, medicaments and biological substances status: Secondary | ICD-10-CM | POA: Diagnosis not present

## 2021-05-28 DIAGNOSIS — Z419 Encounter for procedure for purposes other than remedying health state, unspecified: Secondary | ICD-10-CM

## 2021-05-28 DIAGNOSIS — F429 Obsessive-compulsive disorder, unspecified: Secondary | ICD-10-CM | POA: Diagnosis not present

## 2021-05-28 DIAGNOSIS — E119 Type 2 diabetes mellitus without complications: Secondary | ICD-10-CM | POA: Diagnosis not present

## 2021-05-28 DIAGNOSIS — E782 Mixed hyperlipidemia: Secondary | ICD-10-CM | POA: Diagnosis not present

## 2021-05-28 DIAGNOSIS — Z981 Arthrodesis status: Secondary | ICD-10-CM | POA: Diagnosis not present

## 2021-05-28 DIAGNOSIS — M4324 Fusion of spine, thoracic region: Secondary | ICD-10-CM | POA: Diagnosis not present

## 2021-05-28 LAB — GLUCOSE, CAPILLARY
Glucose-Capillary: 116 mg/dL — ABNORMAL HIGH (ref 70–99)
Glucose-Capillary: 162 mg/dL — ABNORMAL HIGH (ref 70–99)

## 2021-05-28 SURGERY — POSTERIOR LUMBAR FUSION 1 LEVEL
Anesthesia: General | Site: Back

## 2021-05-28 MED ORDER — ONDANSETRON HCL 4 MG PO TABS
4.0000 mg | ORAL_TABLET | Freq: Four times a day (QID) | ORAL | Status: DC | PRN
Start: 2021-05-28 — End: 2021-05-30

## 2021-05-28 MED ORDER — APREPITANT 40 MG PO CAPS: 40.0000 mg | ORAL_CAPSULE | Freq: Once | ORAL | Status: AC

## 2021-05-28 MED ORDER — THROMBIN 5000 UNITS EX SOLR
CUTANEOUS | Status: AC
  Filled 2021-05-28: qty 5000

## 2021-05-28 MED ORDER — LIDOCAINE 2% (20 MG/ML) 5 ML SYRINGE
INTRAMUSCULAR | Status: AC
  Filled 2021-05-28: qty 5

## 2021-05-28 MED ORDER — BUPIVACAINE LIPOSOME 1.3 % IJ SUSP
INTRAMUSCULAR | Status: DC | PRN
Start: 2021-05-28 — End: 2021-05-28
  Administered 2021-05-28: 20 mL

## 2021-05-28 MED ORDER — BUPIVACAINE-EPINEPHRINE 0.5% -1:200000 IJ SOLN
INTRAMUSCULAR | Status: AC
  Filled 2021-05-28: qty 1

## 2021-05-28 MED ORDER — ZOLPIDEM TARTRATE 5 MG PO TABS: 5.0000 mg | ORAL_TABLET | Freq: Every evening | ORAL | Status: DC | PRN

## 2021-05-28 MED ORDER — ROCURONIUM BROMIDE 10 MG/ML (PF) SYRINGE
PREFILLED_SYRINGE | INTRAVENOUS | Status: DC | PRN
  Administered 2021-05-28: 60 mg via INTRAVENOUS
  Administered 2021-05-28: 30 mg via INTRAVENOUS
  Administered 2021-05-28: 40 mg via INTRAVENOUS
  Administered 2021-05-28: 20 mg via INTRAVENOUS
  Administered 2021-05-28: 40 mg via INTRAVENOUS

## 2021-05-28 MED ORDER — BUPIVACAINE-EPINEPHRINE (PF) 0.5% -1:200000 IJ SOLN
INTRAMUSCULAR | Status: DC | PRN
  Administered 2021-05-28: 10 mL

## 2021-05-28 MED ORDER — FENTANYL CITRATE (PF) 250 MCG/5ML IJ SOLN
INTRAMUSCULAR | Status: AC
  Filled 2021-05-28: qty 5

## 2021-05-28 MED ORDER — PHENYLEPHRINE 40 MCG/ML (10ML) SYRINGE FOR IV PUSH (FOR BLOOD PRESSURE SUPPORT)
PREFILLED_SYRINGE | INTRAVENOUS | Status: AC
  Filled 2021-05-28: qty 20

## 2021-05-28 MED ORDER — PROMETHAZINE HCL 25 MG/ML IJ SOLN
6.2500 mg | INTRAMUSCULAR | Status: DC | PRN
  Administered 2021-05-28: 18:00:00 6.25 mg via INTRAVENOUS

## 2021-05-28 MED ORDER — ROCURONIUM BROMIDE 10 MG/ML (PF) SYRINGE
PREFILLED_SYRINGE | INTRAVENOUS | Status: AC
  Filled 2021-05-28: qty 10

## 2021-05-28 MED ORDER — MIRABEGRON ER 25 MG PO TB24
25.0000 mg | ORAL_TABLET | Freq: Every day | ORAL | Status: DC
  Administered 2021-05-28 – 2021-05-29 (×2): 25 mg via ORAL
  Filled 2021-05-28 (×3): qty 1

## 2021-05-28 MED ORDER — PROMETHAZINE HCL 25 MG/ML IJ SOLN
INTRAMUSCULAR | Status: AC
  Filled 2021-05-28: qty 1

## 2021-05-28 MED ORDER — DOCUSATE SODIUM 100 MG PO CAPS
100.0000 mg | ORAL_CAPSULE | Freq: Two times a day (BID) | ORAL | Status: DC
  Administered 2021-05-28 – 2021-05-29 (×3): 100 mg via ORAL
  Filled 2021-05-28 (×3): qty 1

## 2021-05-28 MED ORDER — ONDANSETRON HCL 4 MG/2ML IJ SOLN
INTRAMUSCULAR | Status: AC
  Filled 2021-05-28: qty 2

## 2021-05-28 MED ORDER — LACTATED RINGERS IV SOLN: INTRAVENOUS | Status: DC

## 2021-05-28 MED ORDER — OXYCODONE HCL 5 MG/5ML PO SOLN: 5.0000 mg | Freq: Once | ORAL | Status: DC | PRN

## 2021-05-28 MED ORDER — ARTIFICIAL TEARS OPHTHALMIC OINT
TOPICAL_OINTMENT | OPHTHALMIC | Status: AC
  Filled 2021-05-28: qty 3.5

## 2021-05-28 MED ORDER — PHENYLEPHRINE HCL-NACL 20-0.9 MG/250ML-% IV SOLN
INTRAVENOUS | Status: DC | PRN
Start: 2021-05-28 — End: 2021-05-28
  Administered 2021-05-28: 25 ug/min via INTRAVENOUS

## 2021-05-28 MED ORDER — SERTRALINE HCL 50 MG PO TABS
100.0000 mg | ORAL_TABLET | Freq: Every day | ORAL | Status: DC
  Administered 2021-05-28 – 2021-05-29 (×2): 100 mg via ORAL
  Filled 2021-05-28 (×2): qty 2

## 2021-05-28 MED ORDER — SODIUM CHLORIDE 0.9% FLUSH: 3.0000 mL | INTRAVENOUS | Status: DC | PRN

## 2021-05-28 MED ORDER — PHENOL 1.4 % MT LIQD: 1.0000 | OROMUCOSAL | Status: DC | PRN

## 2021-05-28 MED ORDER — POLYVINYL ALCOHOL 1.4 % OP SOLN
1.0000 [drp] | Freq: Three times a day (TID) | OPHTHALMIC | Status: DC | PRN
  Filled 2021-05-28: qty 15

## 2021-05-28 MED ORDER — PHENYLEPHRINE 40 MCG/ML (10ML) SYRINGE FOR IV PUSH (FOR BLOOD PRESSURE SUPPORT)
PREFILLED_SYRINGE | INTRAVENOUS | Status: AC
  Filled 2021-05-28: qty 10

## 2021-05-28 MED ORDER — BUPIVACAINE LIPOSOME 1.3 % IJ SUSP
INTRAMUSCULAR | Status: AC
  Filled 2021-05-28: qty 20

## 2021-05-28 MED ORDER — BACITRACIN ZINC 500 UNIT/GM EX OINT
TOPICAL_OINTMENT | CUTANEOUS | Status: AC
  Filled 2021-05-28: qty 28.35

## 2021-05-28 MED ORDER — EPHEDRINE 5 MG/ML INJ
INTRAVENOUS | Status: AC
  Filled 2021-05-28: qty 5

## 2021-05-28 MED ORDER — PANTOPRAZOLE SODIUM 40 MG PO TBEC
40.0000 mg | DELAYED_RELEASE_TABLET | Freq: Every day | ORAL | Status: DC
  Administered 2021-05-28 – 2021-05-29 (×2): 40 mg via ORAL
  Filled 2021-05-28 (×2): qty 1

## 2021-05-28 MED ORDER — BACITRACIN ZINC 500 UNIT/GM EX OINT
TOPICAL_OINTMENT | CUTANEOUS | Status: DC | PRN
  Administered 2021-05-28: 1 via TOPICAL

## 2021-05-28 MED ORDER — MEPERIDINE HCL 25 MG/ML IJ SOLN: 6.2500 mg | INTRAMUSCULAR | Status: DC | PRN

## 2021-05-28 MED ORDER — DEXAMETHASONE SODIUM PHOSPHATE 10 MG/ML IJ SOLN
INTRAMUSCULAR | Status: AC
  Filled 2021-05-28: qty 1

## 2021-05-28 MED ORDER — SODIUM CHLORIDE 0.9 % IV SOLN: 250.0000 mL | INTRAVENOUS | Status: DC

## 2021-05-28 MED ORDER — FENTANYL CITRATE (PF) 100 MCG/2ML IJ SOLN
INTRAMUSCULAR | Status: DC | PRN
  Administered 2021-05-28: 25 ug via INTRAVENOUS
  Administered 2021-05-28: 50 ug via INTRAVENOUS
  Administered 2021-05-28 (×3): 100 ug via INTRAVENOUS
  Administered 2021-05-28: 25 ug via INTRAVENOUS

## 2021-05-28 MED ORDER — SODIUM CHLORIDE 0.9% FLUSH: 3.0000 mL | Freq: Two times a day (BID) | INTRAVENOUS | Status: DC

## 2021-05-28 MED ORDER — METFORMIN HCL 500 MG PO TABS
500.0000 mg | ORAL_TABLET | Freq: Two times a day (BID) | ORAL | Status: DC
  Administered 2021-05-29 – 2021-05-30 (×3): 500 mg via ORAL
  Filled 2021-05-28 (×3): qty 1

## 2021-05-28 MED ORDER — PROPOFOL 10 MG/ML IV BOLUS
INTRAVENOUS | Status: AC
  Filled 2021-05-28: qty 20

## 2021-05-28 MED ORDER — OXYCODONE HCL 5 MG PO TABS
10.0000 mg | ORAL_TABLET | ORAL | Status: DC | PRN
  Administered 2021-05-28 – 2021-05-30 (×11): 10 mg via ORAL
  Filled 2021-05-28 (×11): qty 2

## 2021-05-28 MED ORDER — MIDAZOLAM HCL 2 MG/2ML IJ SOLN
INTRAMUSCULAR | Status: DC | PRN
  Administered 2021-05-28: 2 mg via INTRAVENOUS

## 2021-05-28 MED ORDER — HYDROMORPHONE HCL 1 MG/ML IJ SOLN
INTRAMUSCULAR | Status: AC
  Filled 2021-05-28: qty 1

## 2021-05-28 MED ORDER — CHLORHEXIDINE GLUCONATE CLOTH 2 % EX PADS: 6.0000 | MEDICATED_PAD | Freq: Once | CUTANEOUS | Status: DC

## 2021-05-28 MED ORDER — MIDAZOLAM HCL 2 MG/2ML IJ SOLN
INTRAMUSCULAR | Status: AC
  Filled 2021-05-28: qty 2

## 2021-05-28 MED ORDER — CEFAZOLIN SODIUM-DEXTROSE 2-4 GM/100ML-% IV SOLN
2.0000 g | Freq: Three times a day (TID) | INTRAVENOUS | Status: AC
  Administered 2021-05-28 – 2021-05-29 (×2): 2 g via INTRAVENOUS
  Filled 2021-05-28 (×2): qty 100

## 2021-05-28 MED ORDER — INSULIN ASPART 100 UNIT/ML IJ SOLN
0.0000 [IU] | Freq: Three times a day (TID) | INTRAMUSCULAR | Status: DC
  Administered 2021-05-29 (×3): 2 [IU] via SUBCUTANEOUS

## 2021-05-28 MED ORDER — RANOLAZINE ER 500 MG PO TB12
500.0000 mg | ORAL_TABLET | Freq: Two times a day (BID) | ORAL | Status: DC
  Administered 2021-05-28 – 2021-05-29 (×3): 500 mg via ORAL
  Filled 2021-05-28 (×4): qty 1

## 2021-05-28 MED ORDER — MORPHINE SULFATE (PF) 4 MG/ML IV SOLN: 4.0000 mg | INTRAVENOUS | Status: DC | PRN

## 2021-05-28 MED ORDER — ORAL CARE MOUTH RINSE: 15.0000 mL | Freq: Once | OROMUCOSAL | Status: AC

## 2021-05-28 MED ORDER — LIDOCAINE 2% (20 MG/ML) 5 ML SYRINGE
INTRAMUSCULAR | Status: DC | PRN
  Administered 2021-05-28: 20 mg via INTRAVENOUS

## 2021-05-28 MED ORDER — OXYCODONE HCL 5 MG PO TABS: 5.0000 mg | ORAL_TABLET | ORAL | Status: DC | PRN

## 2021-05-28 MED ORDER — ONDANSETRON HCL 4 MG/2ML IJ SOLN: 4.0000 mg | Freq: Four times a day (QID) | INTRAMUSCULAR | Status: DC | PRN

## 2021-05-28 MED ORDER — INSULIN ASPART 100 UNIT/ML IJ SOLN
0.0000 [IU] | Freq: Every day | INTRAMUSCULAR | Status: DC
  Administered 2021-05-28: 21:00:00 3 [IU] via SUBCUTANEOUS

## 2021-05-28 MED ORDER — 0.9 % SODIUM CHLORIDE (POUR BTL) OPTIME
TOPICAL | Status: DC | PRN
  Administered 2021-05-28: 14:00:00 1000 mL

## 2021-05-28 MED ORDER — SUGAMMADEX SODIUM 200 MG/2ML IV SOLN
INTRAVENOUS | Status: DC | PRN
  Administered 2021-05-28: 200 mg via INTRAVENOUS

## 2021-05-28 MED ORDER — EZETIMIBE 10 MG PO TABS
10.0000 mg | ORAL_TABLET | Freq: Every evening | ORAL | Status: DC
  Administered 2021-05-28 – 2021-05-29 (×2): 10 mg via ORAL
  Filled 2021-05-28 (×2): qty 1

## 2021-05-28 MED ORDER — PHENYLEPHRINE 40 MCG/ML (10ML) SYRINGE FOR IV PUSH (FOR BLOOD PRESSURE SUPPORT)
PREFILLED_SYRINGE | INTRAVENOUS | Status: DC | PRN
  Administered 2021-05-28: 120 ug via INTRAVENOUS

## 2021-05-28 MED ORDER — BISACODYL 10 MG RE SUPP: 10.0000 mg | Freq: Every day | RECTAL | Status: DC | PRN

## 2021-05-28 MED ORDER — LACTATED RINGERS IV SOLN: INTRAVENOUS | Status: DC | PRN

## 2021-05-28 MED ORDER — ACETAMINOPHEN 650 MG RE SUPP: 650.0000 mg | RECTAL | Status: DC | PRN

## 2021-05-28 MED ORDER — MENTHOL 3 MG MT LOZG: 1.0000 | LOZENGE | OROMUCOSAL | Status: DC | PRN

## 2021-05-28 MED ORDER — APREPITANT 40 MG PO CAPS
ORAL_CAPSULE | ORAL | Status: AC
  Administered 2021-05-28: 12:00:00 40 mg via ORAL
  Filled 2021-05-28: qty 1

## 2021-05-28 MED ORDER — ACETAMINOPHEN 325 MG PO TABS
650.0000 mg | ORAL_TABLET | ORAL | Status: DC | PRN
  Administered 2021-05-29 – 2021-05-30 (×2): 650 mg via ORAL
  Filled 2021-05-28 (×2): qty 2

## 2021-05-28 MED ORDER — CEFAZOLIN SODIUM-DEXTROSE 2-4 GM/100ML-% IV SOLN
2.0000 g | INTRAVENOUS | Status: AC
  Administered 2021-05-28: 12:00:00 2 g via INTRAVENOUS
  Filled 2021-05-28: qty 100

## 2021-05-28 MED ORDER — PROPOFOL 10 MG/ML IV BOLUS
INTRAVENOUS | Status: DC | PRN
  Administered 2021-05-28: 120 mg via INTRAVENOUS

## 2021-05-28 MED ORDER — ACETAMINOPHEN 500 MG PO TABS
1000.0000 mg | ORAL_TABLET | Freq: Four times a day (QID) | ORAL | Status: AC
  Administered 2021-05-28 – 2021-05-29 (×4): 1000 mg via ORAL
  Filled 2021-05-28 (×4): qty 2

## 2021-05-28 MED ORDER — CYCLOBENZAPRINE HCL 10 MG PO TABS
10.0000 mg | ORAL_TABLET | Freq: Three times a day (TID) | ORAL | Status: DC | PRN
  Administered 2021-05-28 – 2021-05-29 (×2): 10 mg via ORAL
  Filled 2021-05-28 (×2): qty 1

## 2021-05-28 MED ORDER — CHLORHEXIDINE GLUCONATE 0.12 % MT SOLN
15.0000 mL | Freq: Once | OROMUCOSAL | Status: AC
  Administered 2021-05-28: 11:00:00 15 mL via OROMUCOSAL
  Filled 2021-05-28: qty 15

## 2021-05-28 MED ORDER — ISOSORBIDE MONONITRATE ER 60 MG PO TB24
60.0000 mg | ORAL_TABLET | Freq: Every day | ORAL | Status: DC
  Administered 2021-05-28 – 2021-05-29 (×2): 60 mg via ORAL
  Filled 2021-05-28 (×3): qty 1

## 2021-05-28 MED ORDER — FERROUS SULFATE 325 (65 FE) MG PO TABS
325.0000 mg | ORAL_TABLET | Freq: Every day | ORAL | Status: DC
  Administered 2021-05-29 – 2021-05-30 (×2): 325 mg via ORAL
  Filled 2021-05-28 (×2): qty 1

## 2021-05-28 MED ORDER — OXYCODONE HCL 5 MG PO TABS: 5.0000 mg | ORAL_TABLET | Freq: Once | ORAL | Status: DC | PRN

## 2021-05-28 MED ORDER — SUCCINYLCHOLINE CHLORIDE 200 MG/10ML IV SOSY
PREFILLED_SYRINGE | INTRAVENOUS | Status: AC
  Filled 2021-05-28: qty 10

## 2021-05-28 MED ORDER — DEXAMETHASONE SODIUM PHOSPHATE 10 MG/ML IJ SOLN
INTRAMUSCULAR | Status: DC | PRN
  Administered 2021-05-28: 10 mg via INTRAVENOUS

## 2021-05-28 MED ORDER — HYDROMORPHONE HCL 1 MG/ML IJ SOLN
0.2500 mg | INTRAMUSCULAR | Status: DC | PRN
  Administered 2021-05-28 (×3): 0.25 mg via INTRAVENOUS
  Administered 2021-05-28: 17:00:00 0.5 mg via INTRAVENOUS
  Administered 2021-05-28: 17:00:00 0.25 mg via INTRAVENOUS
  Administered 2021-05-28: 17:00:00 0.5 mg via INTRAVENOUS

## 2021-05-28 MED ORDER — NITROGLYCERIN 0.4 MG SL SUBL: 0.4000 mg | SUBLINGUAL_TABLET | SUBLINGUAL | Status: DC | PRN

## 2021-05-28 MED ORDER — EPHEDRINE SULFATE-NACL 50-0.9 MG/10ML-% IV SOSY
PREFILLED_SYRINGE | INTRAVENOUS | Status: DC | PRN
  Administered 2021-05-28 (×2): 5 mg via INTRAVENOUS
  Administered 2021-05-28: 10 mg via INTRAVENOUS

## 2021-05-28 MED ORDER — LOSARTAN POTASSIUM 50 MG PO TABS: 50.0000 mg | ORAL_TABLET | Freq: Every day | ORAL | Status: DC | PRN

## 2021-05-28 MED ORDER — VITAMIN D 25 MCG (1000 UNIT) PO TABS
5000.0000 [IU] | ORAL_TABLET | Freq: Every day | ORAL | Status: DC
  Administered 2021-05-28 – 2021-05-29 (×2): 5000 [IU] via ORAL
  Filled 2021-05-28 (×2): qty 5

## 2021-05-28 MED ORDER — DILTIAZEM HCL ER COATED BEADS 120 MG PO CP24
240.0000 mg | ORAL_CAPSULE | Freq: Every day | ORAL | Status: DC
  Administered 2021-05-28 – 2021-05-29 (×2): 240 mg via ORAL
  Filled 2021-05-28 (×2): qty 2

## 2021-05-28 MED ORDER — MIDAZOLAM HCL 2 MG/2ML IJ SOLN: 0.5000 mg | Freq: Once | INTRAMUSCULAR | Status: DC | PRN

## 2021-05-28 MED ORDER — THROMBIN 5000 UNITS EX SOLR
OROMUCOSAL | Status: DC | PRN
  Administered 2021-05-28: 14:00:00 5 mL via TOPICAL

## 2021-05-28 SURGICAL SUPPLY — 66 items
BAG COUNTER SPONGE SURGICOUNT (BAG) ×3 IMPLANT
BASKET BONE COLLECTION (BASKET) ×2 IMPLANT
BENZOIN TINCTURE PRP APPL 2/3 (GAUZE/BANDAGES/DRESSINGS) ×2 IMPLANT
BLADE CLIPPER SURG (BLADE) IMPLANT
BUR MATCHSTICK NEURO 3.0 LAGG (BURR) ×2 IMPLANT
BUR PRECISION FLUTE 6.0 (BURR) ×2 IMPLANT
CANISTER SUCT 3000ML PPV (MISCELLANEOUS) ×2 IMPLANT
CAP LOCK DLX THRD (Cap) ×8 IMPLANT
CARTRIDGE OIL MAESTRO DRILL (MISCELLANEOUS) ×1 IMPLANT
CLAMP PAR CONN 6.5X18 (Clamp) ×2 IMPLANT
CLAMP ROD TO ROD 5.5-6.5 SPINE (Clamp) ×2 IMPLANT
CNTNR URN SCR LID CUP LEK RST (MISCELLANEOUS) ×1 IMPLANT
CONT SPEC 4OZ STRL OR WHT (MISCELLANEOUS) ×1
COVER BACK TABLE 60X90IN (DRAPES) ×2 IMPLANT
DECANTER SPIKE VIAL GLASS SM (MISCELLANEOUS) ×1 IMPLANT
DIFFUSER DRILL AIR PNEUMATIC (MISCELLANEOUS) ×2 IMPLANT
DRAPE C-ARM 42X72 X-RAY (DRAPES) ×4 IMPLANT
DRAPE HALF SHEET 40X57 (DRAPES) ×2 IMPLANT
DRAPE LAPAROTOMY 100X72X124 (DRAPES) ×2 IMPLANT
DRAPE SURG 17X23 STRL (DRAPES) ×8 IMPLANT
DRSG OPSITE POSTOP 4X10 (GAUZE/BANDAGES/DRESSINGS) ×1 IMPLANT
DRSG OPSITE POSTOP 4X6 (GAUZE/BANDAGES/DRESSINGS) ×2 IMPLANT
ELECT BLADE 4.0 EZ CLEAN MEGAD (MISCELLANEOUS) ×2
ELECT REM PT RETURN 9FT ADLT (ELECTROSURGICAL) ×2
ELECTRODE BLDE 4.0 EZ CLN MEGD (MISCELLANEOUS) ×1 IMPLANT
ELECTRODE REM PT RTRN 9FT ADLT (ELECTROSURGICAL) ×1 IMPLANT
EVACUATOR 1/8 PVC DRAIN (DRAIN) ×1 IMPLANT
GAUZE 4X4 16PLY ~~LOC~~+RFID DBL (SPONGE) ×2 IMPLANT
GLOVE EXAM NITRILE XL STR (GLOVE) IMPLANT
GLOVE SURG ENC MOIS LTX SZ8 (GLOVE) ×4 IMPLANT
GLOVE SURG ENC MOIS LTX SZ8.5 (GLOVE) ×4 IMPLANT
GOWN STRL REUS W/ TWL LRG LVL3 (GOWN DISPOSABLE) IMPLANT
GOWN STRL REUS W/ TWL XL LVL3 (GOWN DISPOSABLE) ×2 IMPLANT
GOWN STRL REUS W/TWL 2XL LVL3 (GOWN DISPOSABLE) IMPLANT
GOWN STRL REUS W/TWL LRG LVL3 (GOWN DISPOSABLE)
GOWN STRL REUS W/TWL XL LVL3 (GOWN DISPOSABLE) ×2
GRAFT TRINITY ELITE LGE HUMAN (Tissue) ×1 IMPLANT
HEMOSTAT POWDER KIT SURGIFOAM (HEMOSTASIS) ×2 IMPLANT
KIT BASIN OR (CUSTOM PROCEDURE TRAY) ×2 IMPLANT
KIT GRAFTMAG DEL NEURO DISP (NEUROSURGERY SUPPLIES) IMPLANT
KIT TURNOVER KIT B (KITS) ×2 IMPLANT
MILL MEDIUM DISP (BLADE) ×1 IMPLANT
NDL HYPO 21X1.5 SAFETY (NEEDLE) IMPLANT
NEEDLE HYPO 21X1.5 SAFETY (NEEDLE) ×2 IMPLANT
NEEDLE HYPO 22GX1.5 SAFETY (NEEDLE) ×2 IMPLANT
NS IRRIG 1000ML POUR BTL (IV SOLUTION) ×3 IMPLANT
OIL CARTRIDGE MAESTRO DRILL (MISCELLANEOUS) ×2
PACK LAMINECTOMY NEURO (CUSTOM PROCEDURE TRAY) ×2 IMPLANT
PAD ARMBOARD 7.5X6 YLW CONV (MISCELLANEOUS) ×6 IMPLANT
PADDING CAST ABS 4INX4YD NS (CAST SUPPLIES) ×1
PADDING CAST ABS COTTON 4X4 ST (CAST SUPPLIES) IMPLANT
PATTIES SURGICAL .5 X1 (DISPOSABLE) IMPLANT
ROD Z OFFSET TI 6.35X12X100 (Rod) ×2 IMPLANT
SCREW PA DLX CREO 5X35 (Screw) ×6 IMPLANT
SPONGE NEURO XRAY DETECT 1X3 (DISPOSABLE) IMPLANT
SPONGE SURGIFOAM ABS GEL 100 (HEMOSTASIS) IMPLANT
SPONGE T-LAP 4X18 ~~LOC~~+RFID (SPONGE) ×2 IMPLANT
STRIP CLOSURE SKIN 1/2X4 (GAUZE/BANDAGES/DRESSINGS) ×2 IMPLANT
SUT VIC AB 1 CT1 18XBRD ANBCTR (SUTURE) ×2 IMPLANT
SUT VIC AB 1 CT1 8-18 (SUTURE) ×3
SUT VIC AB 2-0 CP2 18 (SUTURE) ×5 IMPLANT
SYR 20ML LL LF (SYRINGE) ×1 IMPLANT
TOWEL GREEN STERILE (TOWEL DISPOSABLE) ×2 IMPLANT
TOWEL GREEN STERILE FF (TOWEL DISPOSABLE) ×2 IMPLANT
TRAY FOLEY MTR SLVR 16FR STAT (SET/KITS/TRAYS/PACK) ×2 IMPLANT
WATER STERILE IRR 1000ML POUR (IV SOLUTION) ×2 IMPLANT

## 2021-05-28 NOTE — Anesthesia Procedure Notes (Signed)
Procedure Name: Intubation Date/Time: 05/28/2021 12:11 PM Performed by: Barrington Ellison, CRNA Pre-anesthesia Checklist: Patient identified, Emergency Drugs available, Suction available and Patient being monitored Patient Re-evaluated:Patient Re-evaluated prior to induction Oxygen Delivery Method: Circle System Utilized Preoxygenation: Pre-oxygenation with 100% oxygen Induction Type: IV induction, Rapid sequence and Cricoid Pressure applied Ventilation: Mask ventilation without difficulty Laryngoscope Size: Mac and 3 Grade View: Grade I Tube type: Oral Number of attempts: 1 Airway Equipment and Method: Stylet and Oral airway Placement Confirmation: ETT inserted through vocal cords under direct vision, positive ETCO2 and breath sounds checked- equal and bilateral Secured at: 21 cm Tube secured with: Tape Dental Injury: Teeth and Oropharynx as per pre-operative assessment

## 2021-05-28 NOTE — Op Note (Signed)
Brief history: The patient is a 70 year old white female who has had multiple prior back surgeries/fusions.  She has developed worsening back pain.  She failed medical management.  She was worked up with a myelo CT which demonstrated a thoracic range as an degenerative disc disease.  I discussed the various treatment options with her.  She has decided proceed with surgery.  Preoperative diagnosis: Thoracic herniated disc, degenerative disc disease, thoracic spine pain, thoracic pseudoarthrosis  Postoperative diagnosis: The same  Procedure: Exploration of thoracolumbar fusion; right T10-11 laminotomy/discectomy; posterior lateral arthrodesis T7-8, T8-9, T9-10 and T10-11 using local morselized autograft bone and Trinity bone graft extender; posterior segmental instrumentation T7-L1 with globus titanium pedicle screws and rods   Surgeon: Dr. Earle Gell  Asst.: Arnetha Massy, NP  Anesthesia: Gen. endotracheal  Estimated blood loss: 200 cc  Drains: 1 medium Hemovac  Complications: None  Description of procedure: The patient was brought to the operating room by the anesthesia team. General endotracheal anesthesia was induced. The patient was turned to the prone position on the Wilson frame. The patient's lumbosacral region was then prepared with Betadine scrub and Betadine solution. Sterile drapes were applied.  I then injected the area to be incised with Marcaine with epinephrine solution. I then used the scalpel to make a linear midline incision through the old surgical scar and up to C7.  I then used electrocautery to perform a bilateral subperiosteal dissection exposing the spinous process and lamina of the T7, T8, T9, T10, T11, T12 and L1, and exposing the upper aspect of the old thoracic lumbar instrumentation.   We then inserted the Verstrac retractor to provide exposure.  I began the decompression by using the high speed drill to perform a right T10-11 laminotomy. We then used the  Kerrison punches to widen the laminotomy and removed the ligamentum flavum at T10-11 on the right. We used the Kerrison punches to remove the medial facets at T10-11 on the right.  Working lateral to the dura I dissected down to the intervertebral disc and exit exit encountered a calcified herniated disc.  We remove this with the pituitary forceps and multiple fragments decompressing the thecal sac and the nerve root.  We now turned attention to the instrumentation. Under fluoroscopic guidance we cannulated the bilateral T7, T8 and T9 pedicles with the bone probe. We then removed the bone probe. We then tapped the pedicle with a 4 millimeter tap. We then removed the tap. We probed inside the tapped pedicle with a ball probe to rule out cortical breaches. We then inserted a 5.0 x 35 millimeter pedicle screw into the T7, T8 and T9 pedicles bilaterally under fluoroscopic guidance. We then connected the unilateral pedicle screws with a lordotic rod which we used connectors to connect with the old rods in her previous thoracolumbar instrumentation.  We then tightened the caps appropriately. This completed the instrumentation from T7-L1.  We now turned our attention to the posterior lateral arthrodesis at T7-8, T8-9, T9-10 and the redo correct lumbar fusion at T10-11. We used the high-speed drill to decorticate the remainder of the facets, pars, transverse process at T7-8, T8-9, T9-10 and T10-11. We then applied a combination of local morselized autograft bone and Zimmer DBM over these decorticated posterior lateral structures. This completed the posterior lateral arthrodesis at T7-8, T8-9, T9-10 and T10-11  We then obtained hemostasis using bipolar electrocautery. We irrigated the wound out with bacitracin solution. We inspected the thecal sac and nerve roots and noted they were well decompressed.  We then removed the retractor.  We injected Exparel .  We placed a medium Hemovac drain in the epidural space and  tunneled out through a separate stab wound.  We reapproximated patient's thoracolumbar fascia with interrupted #1 Vicryl suture. We reapproximated patient's subcutaneous tissue with interrupted 2-0 Vicryl suture. The reapproximated patient's skin with Steri-Strips and benzoin. The wound was then coated with bacitracin ointment. A sterile dressing was applied. The drapes were removed. The patient was subsequently returned to the supine position where they were extubated by the anesthesia team. He was then transported to the post anesthesia care unit in stable condition. All sponge instrument and needle counts were reportedly correct at the end of this case.

## 2021-05-28 NOTE — Progress Notes (Signed)
Orthopedic Tech Progress Note Patient Details:  Elaine Roach 04-19-1951 [id number] Dropped off at bedside  Ortho Devices Type of Ortho Device: Thoracolumbar corset (TLSO) Ortho Device/Splint Interventions: Ordered   Post Interventions Patient Tolerated: Other (comment) Instructions Provided: Other (comment)  [name] 05/28/2021, 8:06 PM

## 2021-05-28 NOTE — Anesthesia Postprocedure Evaluation (Signed)
Anesthesia Post Note  Patient: [name]  Procedure(s) Performed: THORACIC TEN - ELEVEN RIGHT DISCECTOMY, EXPLORE THORACIC FUSION, EXTENSION OF INSTRUMENTATION AND FUSION  THORACIC SEVEN- ELEVEN (Back)     Patient location during evaluation: PACU Anesthesia Type: General Level of consciousness: awake and alert, patient cooperative and oriented Pain management: pain level controlled (pain improving) Vital Signs Assessment: post-procedure vital signs reviewed and stable Respiratory status: spontaneous breathing, nonlabored ventilation and respiratory function stable Cardiovascular status: blood pressure returned to baseline and stable Postop Assessment: no apparent nausea or vomiting Anesthetic complications: no   No notable events documented.  Last Vitals:  Vitals:   05/28/21 1650 05/28/21 1705  BP: (!) 163/71 (!) 154/69  Pulse: 86 85  Resp: 14 17  Temp:    SpO2: 98% 99%    Last Pain:  Vitals:   05/28/21 1705  TempSrc:   PainSc: 7     LLE Motor Response: Purposeful movement (05/28/21 1705) LLE Sensation: Full sensation (05/28/21 1705) RLE Motor Response: Purposeful movement (05/28/21 1705) RLE Sensation: Full sensation (05/28/21 1705)      Esta Carmon,E. Montoya Brandel

## 2021-05-28 NOTE — Transfer of Care (Signed)
Immediate Anesthesia Transfer of Care Note  Patient: [name]  Procedure(s) Performed: THORACIC TEN - ELEVEN RIGHT DISCECTOMY, EXPLORE THORACIC FUSION, EXTENSION OF INSTRUMENTATION AND FUSION  THORACIC SEVEN- ELEVEN (Back)  Patient Location: PACU  Anesthesia Type:General  Level of Consciousness: awake and oriented  Airway & Oxygen Therapy: Patient Spontanous Breathing and Patient connected to nasal cannula oxygen  Post-op Assessment: Report given to RN and Patient moving all extremities X 4  Post vital signs: Reviewed and stable  Last Vitals:  Vitals Value Taken Time  BP 150/59 05/28/21 1620  Temp 36.4 C 05/28/21 1620  Pulse 85 05/28/21 1621  Resp 13 05/28/21 1621  SpO2 97 % 05/28/21 1621  Vitals shown include unvalidated device data.  Last Pain:  Vitals:   05/28/21 1030  TempSrc:   PainSc: 8       Patients Stated Pain Goal: 0 (18/55/01 5868)  Complications: No notable events documented.

## 2021-05-28 NOTE — H&P (Signed)
Subjective: The patient is a 70 year old white female whose had multiple prior lumbar fusions by me and other physicians.  Her most recent surgery was removal of T10 pedicle screws for pseudoarthrosis on 08/29/2020.  She has had persistent thoracic spine pain.  She was worked up with a myelo CT  She had disc generation at T9-10 and T10 1011 with a right herniated disc at T10-11.  I discussed the various treatment options with her.  She has decided proceed with surgery.  Past Medical History:  Diagnosis Date   Arthritis    Carotid artery calcification    Complication of anesthesia    pt. woke up during hand surgery and colonoscopy, cataract surgery   Coronary artery disease    Diabetes mellitus without complication ([place])    type 2    dx 10 yrs ago   Dysrhythmia    BENIGN PVC'S   GERD (gastroesophageal reflux disease)    Headache    History of OCD (obsessive compulsive disorder)    TO A DEGREE   Hypertension    Insomnia    Mixed hyperlipidemia    Nonalcoholic steatohepatitis (NA[facility])    DX IN 1995   Osteoarthritis    Pneumonia    walking pneu. in the past   PONV (postoperative nausea and vomiting)     Past Surgical History:  Procedure Laterality Date   ANTERIOR CERVICAL DECOMP/DISCECTOMY FUSION  07/19/2017   Procedure: ANTERIOR CERVICAL DECOMPRESSION/DISCECTOMY FUSION , INTERBODY PROSTHESIS, ANTERIOR PLATE CERVICAL SIX- CERVICAL SEVEN, CERVICAL SEVEN- THORACIC ONE, EXPLORE OLD FUSION;  Surgeon: Newman Pies, MD;  Location: [facility];  Service: Neurosurgery;;   BACK SURGERY     x3   BUNIONECTOMY     RIGHT   CARDIAC CATHETERIZATION     CARPAL TUNNEL RELEASE     RIGHT   CHOLECYSTECTOMY     GANGLION CYST EXCISION Right    LEFT HEART CATH AND CORONARY ANGIOGRAPHY N/A 04/27/2018   Procedure: LEFT HEART CATH AND CORONARY ANGIOGRAPHY;  Surgeon: Leonie Man, MD;  Location: [facility];  Service: Cardiovascular;  Laterality: N/A;   MYELOGRAM     SHOULDER ARTHROSCOPY W/  ROTATOR CUFF REPAIR     4 SCOPES.Marland KitchenMarland KitchenALL ON THE RIGHT   TUBAL LIGATION     WOUND EXPLORATION  03/19/2015   CERVICAL WOUND    WOUND EXPLORATION N/A 03/19/2015   Procedure: CERVICAL WOUND EXPLORATION, EVACUATION OF CERVICAL HEMATOMA;  Surgeon: Newman Pies, MD;  Location: [facility];  Service: Neurosurgery;  Laterality: N/A;  cervical wound exploration, evacuation of cervical hematoma    Allergies  Allergen Reactions   Codeine Itching and Swelling    Facial swelling   Lisinopril Hives   Hydrocodone Bit-Homatrop Mbr Rash and Itching   Tape Rash    Surgical tape     Social History   Tobacco Use   Smoking status: Former    Packs/day: 2.00    Years: 17.00    Pack years: 34.00    Types: Cigarettes   Smokeless tobacco: Never   Tobacco comments:    quit when she was 35  Substance Use Topics   Alcohol use: Yes    Comment: OCCASIONAL  WINE    Family History  Problem Relation Age of Onset   Breast cancer Mother    Heart Problems Father    Heart failure Paternal Grandmother    Heart failure Paternal Grandfather    Prior to Admission medications   Medication Sig Start Date End Date Taking? Authorizing  Provider  aspirin 81 MG EC tablet Take 81 mg by mouth at bedtime.   Yes [provider]  Cholecalciferol (VITAMIN D3) 5000 units CAPS Take 5,000 Units by mouth daily.   Yes [provider]  diltiazem (CARDIZEM CD) 240 MG 24 hr capsule TAKE 1 CAPSULE BY MOUTH DAILY 12/03/20  Yes Richardo Priest, MD  ezetimibe (ZETIA) 10 MG tablet Take 1 tablet (10 mg total) by mouth every evening. 08/28/20  Yes Richardo Priest, MD  ferrous sulfate 325 (65 FE) MG tablet Take 325 mg by mouth daily with breakfast.   Yes [provider]  hydroxypropyl methylcellulose / hypromellose (ISOPTO TEARS / GONIOVISC) 2.5 % ophthalmic solution Place 1 drop into both eyes 3 (three) times daily as needed for dry eyes.   Yes [provider]  isosorbide mononitrate (IMDUR) 60 MG 24 hr  tablet Take 1 tablet (60 mg total) by mouth daily. 08/28/20  Yes Richardo Priest, MD  losartan (COZAAR) 50 MG tablet Take 50 mg by mouth daily as needed (Blood Pressure is greater than 160 on top).   Yes [provider]  metFORMIN (GLUCOPHAGE) 500 MG tablet Take 500 mg by mouth 2 (two) times daily with a meal.   Yes [provider]  mirabegron ER (MYRBETRIQ) 25 MG TB24 tablet Take 25 mg by mouth daily.   Yes [provider]  omeprazole (PRILOSEC) 40 MG capsule Take 40 mg by mouth every morning.    Yes [provider]  ranolazine (RANEXA) 500 MG 12 hr tablet Take 1 tablet (500 mg total) by mouth 2 (two) times daily. 11/29/20  Yes Richardo Priest, MD  sertraline (ZOLOFT) 100 MG tablet Take 100 mg by mouth at bedtime.   Yes [provider]  vitamin B-12 (CYANOCOBALAMIN) 500 MCG tablet Take 500 mcg by mouth daily.   Yes [provider]  zolpidem (AMBIEN) 10 MG tablet Take 10 mg by mouth at bedtime. 03/13/21  Yes [provider]  cyclobenzaprine (FLEXERIL) 10 MG tablet Take 1 tablet (10 mg total) by mouth 3 (three) times daily as needed for muscle spasms. Patient not taking: Reported on 05/20/2021 08/30/20   Newman Pies, MD  docusate sodium (COLACE) 100 MG capsule Take 1 capsule (100 mg total) by mouth 2 (two) times daily. 08/30/20   Newman Pies, MD  glucose blood test strip 1 strip by Misc.(Non-Drug; Combo Route) route daily. 07/06/17   [provider]  nitroGLYCERIN (NITROSTAT) 0.4 MG SL tablet Place 0.4 mg under the tongue every 5 (five) minutes as needed for chest pain.    [provider]     Review of Systems  Positive ROS: As above  All other systems have been reviewed and were otherwise negative with the exception of those mentioned in the HPI and as above.  Objective: Vital signs in last 24 hours: Temp:  [98.9 F (37.2 C)] 98.9 F (37.2 C) (12/14 1021) Pulse Rate:  [74] 74 (12/14 1021) Resp:  [17] 17  (12/14 1021) BP: (115)/(88) 115/88 (12/14 1021) SpO2:  [97 %] 97 % (12/14 1021) Weight:  [70.3 kg] 70.3 kg (12/14 1021) Estimated body mass index is 28.35 kg/m as calculated from the following:   Height as of this encounter: 5' 2"  (1.575 m).   Weight as of this encounter: 70.3 kg.   General Appearance: Alert Head: Normocephalic, without obvious abnormality, atraumatic Eyes: PERRL, conjunctiva/corneas clear, EOM's intact,    Ears: Normal  Throat: Normal  Neck: Supple, Back:  The patient's lumbar incision is well-healed.  Lungs: Clear to auscultation bilaterally, respirations unlabored Heart: Regular rate and rhythm, no murmur, rub or gallop Abdomen: Soft, non-tender Extremities: Extremities normal, atraumatic, no cyanosis or edema Skin: unremarkable  NEUROLOGIC:   Mental status: alert and oriented,Motor Exam - grossly normal Sensory Exam - grossly normal Reflexes:  Coordination - grossly normal Gait -antalgic Balance - grossly normal Cranial Nerves: I: smell Not tested  II: visual acuity  OS: Normal  OD: Normal   II: visual fields Full to confrontation  II: pupils Equal, round, reactive to light  III,VII: ptosis None  III,IV,VI: extraocular muscles  Full ROM  V: mastication Normal  V: facial light touch sensation  Normal  V,VII: corneal reflex  Present  VII: facial muscle function - upper  Normal  VII: facial muscle function - lower Normal  VIII: hearing Not tested  IX: soft palate elevation  Normal  IX,X: gag reflex Present  XI: trapezius strength  5/5  XI: sternocleidomastoid strength 5/5  XI: neck flexion strength  5/5  XII: tongue strength  Normal    Data Review Lab Results  Component Value Date   WBC 5.9 05/26/2021   HGB 13.4 05/26/2021   HCT 41.4 05/26/2021   MCV 90.8 05/26/2021   PLT 254 05/26/2021   Lab Results  Component Value Date   NA 138 05/26/2021   K 4.0 05/26/2021   CL 102 05/26/2021   CO2 25 05/26/2021   BUN 17 05/26/2021   CREATININE  0.88 05/26/2021   GLUCOSE 115 (H) 05/26/2021   No results found for: INR, PROTIME  Assessment/Plan: T9-10 and T10-11 disc generation, right T10-11 herniated disc, thoracic spine pain: I have discussed the situation with the patient.  I reviewed her myelo CT with her and pointed out the abnormalities.  We have discussed the various treatment options including surgery.  I have described the surgical treatment option of a thoracic cystectomy with extension of her instrumentation and fusion to T7.  I have shown her surgical models.  I have given her a surgical pamphlet.  We have discussed the risk, benefits, alternatives, expected postoperative course, and likelihood of achieving her goals with surgery.  I have answered all her questions.  She has decided proceed with surgery.   [name] 05/28/2021 11:21 AM

## 2021-05-29 LAB — BASIC METABOLIC PANEL
Anion gap: 8 (ref 5–15)
BUN: 11 mg/dL (ref 8–23)
CO2: 28 mmol/L (ref 22–32)
Calcium: 8.6 mg/dL — ABNORMAL LOW (ref 8.9–10.3)
Chloride: 102 mmol/L (ref 98–111)
Creatinine, Ser: 0.94 mg/dL (ref 0.44–1.00)
GFR, Estimated: >60 mL/min (ref >60)
Glucose, Bld: 165 mg/dL — ABNORMAL HIGH (ref 70–99)
Potassium: 4.5 mmol/L (ref 3.5–5.1)
Sodium: 138 mmol/L (ref 135–145)

## 2021-05-29 LAB — GLUCOSE, CAPILLARY
Glucose-Capillary: 284 mg/dL — ABNORMAL HIGH (ref 70–99)
Glucose-Capillary: 145 mg/dL — ABNORMAL HIGH (ref 70–99)
Glucose-Capillary: 121 mg/dL — ABNORMAL HIGH (ref 70–99)
Glucose-Capillary: 133 mg/dL — ABNORMAL HIGH (ref 70–99)
Glucose-Capillary: 138 mg/dL — ABNORMAL HIGH (ref 70–99)

## 2021-05-29 LAB — CBC
HCT: 29.9 % — ABNORMAL LOW (ref 36.0–46.0)
Hemoglobin: 9.8 g/dL — ABNORMAL LOW (ref 12.0–15.0)
MCH: 29.1 pg (ref 26.0–34.0)
MCHC: 32.8 g/dL (ref 30.0–36.0)
MCV: 88.7 fL (ref 80.0–100.0)
Platelets: 244 10*3/uL (ref 150–400)
RBC: 3.37 MIL/uL — ABNORMAL LOW (ref 3.87–5.11)
RDW: 13.2 % (ref 11.5–15.5)
WBC: 8.3 10*3/uL (ref 4.0–10.5)
nRBC: 0 % (ref 0.0–0.2)

## 2021-05-29 NOTE — Evaluation (Signed)
Occupational Therapy Evaluation Patient Details Name: Elaine Roach MRN: [id number] DOB: [birth year] Today's Date: 05/29/2021   History of Present Illness 70 y.o. female presents to [facility] hospital on 05/28/2021 with worsening back pain. Pt underwent T7-L1 PLIF on 05/28/2021. PMH includes OA, CAD, DMII, HTN, insomnia, PNA.   Clinical Impression   Pt admitted for concerns listed above. PTA pt reported that she was independent with all ADL's and IADL's, including driving. At this time, pt presents near her baseline. She is able to complete ADL's with no assist following spinal precautions, has good activity tolerance, and independent with functional mobility. Pt has no further OT needs needs and acute OT will sign off.       Recommendations for follow up therapy are one component of a multi-disciplinary discharge planning process, led by the attending physician.  Recommendations may be updated based on patient status, additional functional criteria and insurance authorization.   Follow Up Recommendations  No OT follow up    Assistance Recommended at Discharge PRN  Functional Status Assessment  Patient has had a recent decline in their functional status and demonstrates the ability to make significant improvements in function in a reasonable and predictable amount of time.  Equipment Recommendations  None recommended by OT    Recommendations for Other Services       Precautions / Restrictions Precautions Precautions: Fall;Back Precaution Booklet Issued: Yes (comment) Precaution Comments: Reviewed precautions and compensatory strategies for ADL's Required Braces or Orthoses: Spinal Brace Spinal Brace: Thoracolumbosacral orthotic;Applied in sitting position (pt needing minA to don brace) Restrictions Weight Bearing Restrictions: No      Mobility Bed Mobility Overal bed mobility: Needs Assistance Bed Mobility: Rolling;Sidelying to Sit Rolling: Supervision Sidelying to sit:  Supervision            Transfers Overall transfer level: Independent                        Balance Overall balance assessment: Needs assistance Sitting-balance support: No upper extremity supported;Feet supported Sitting balance-Leahy Scale: Good     Standing balance support: No upper extremity supported;During functional activity Standing balance-Leahy Scale: Good                             ADL either performed or assessed with clinical judgement   ADL Overall ADL's : Modified independent                                       General ADL Comments: Pt able to complete all ADL's with no difficulties, following compensatory strategies     Vision Baseline Vision/History: 0 No visual deficits Ability to See in Adequate Light: 0 Adequate Patient Visual Report: No change from baseline Vision Assessment?: No apparent visual deficits     Perception     Praxis      Pertinent Vitals/Pain Pain Assessment: 0-10 Pain Score: 2  Pain Location: mid back Pain Descriptors / Indicators: Sore Pain Intervention(s): Monitored during session;Repositioned     Hand Dominance Right   Extremity/Trunk Assessment Upper Extremity Assessment Upper Extremity Assessment: Overall WFL for tasks assessed   Lower Extremity Assessment Lower Extremity Assessment: Overall WFL for tasks assessed   Cervical / Trunk Assessment Cervical / Trunk Assessment: Back Surgery   Communication Communication Communication: No difficulties   Cognition Arousal/Alertness: Awake/alert Behavior During Therapy:  WFL for tasks assessed/performed Overall Cognitive Status: Within Functional Limits for tasks assessed                                       General Comments  Dressings intact, educaated on precautions and compensatory strategies    Exercises     Shoulder Instructions      Home Living Family/patient expects to be discharged to:: Private  residence Living Arrangements: Alone Available Help at Discharge: Family;Available PRN/intermittently (sister, lives next door) Type of Home: House Home Access: Stairs to enter Technical brewer of Steps: 3 Entrance Stairs-Rails: Left Home Layout: One level     Bathroom Shower/Tub: Occupational psychologist: Standard     Home Equipment: Conservation officer, nature (2 wheels);Cane - single point;Shower seat          Prior Functioning/Environment Prior Level of Function : Independent/Modified Independent;Driving             Mobility Comments: PRN use of walker recently due to significant back pain. Pt also reports limited ambulation recently because of pain ADLs Comments: Indep        OT Problem List: Decreased strength;Decreased activity tolerance;Impaired balance (sitting and/or standing);Pain      OT Treatment/Interventions:      OT Goals(Current goals can be found in the care plan section) Acute Rehab OT Goals Patient Stated Goal: To go home OT Goal Formulation: With patient Time For Goal Achievement: 05/29/21 Potential to Achieve Goals: Good  OT Frequency:     Barriers to D/C:            Co-evaluation              AM-PAC OT "6 Clicks" Daily Activity     Outcome Measure Help from another person eating meals?: None Help from another person taking care of personal grooming?: None Help from another person toileting, which includes using toliet, bedpan, or urinal?: None Help from another person bathing (including washing, rinsing, drying)?: None Help from another person to put on and taking off regular upper body clothing?: None Help from another person to put on and taking off regular lower body clothing?: None 6 Click Score: 24   End of Session Equipment Utilized During Treatment: Back brace Nurse Communication: Mobility status  Activity Tolerance: Patient tolerated treatment well Patient left: in bed;with call bell/phone within reach  OT Visit  Diagnosis: Unsteadiness on feet (R26.81);Other abnormalities of gait and mobility (R26.89);Muscle weakness (generalized) (M62.81)                Time: 5102-5852 OT Time Calculation (min): 19 min Charges:  OT General Charges $OT Visit: 1 Visit OT Evaluation $OT Eval Low Complexity: [place]., OTR/L Acute Rehabilitation  Aara Jacquot Elane Yolanda Bonine 05/29/2021, 10:17 AM

## 2021-05-29 NOTE — Progress Notes (Signed)
Subjective: The patient is alert and pleasant.  Her back is appropriately sore.  She wants to stay another day.  Objective: Vital signs in last 24 hours: Temp:  [97.5 F (36.4 C)-98.9 F (37.2 C)] 98.1 F (36.7 C) (12/15 0741) Pulse Rate:  [74-93] 81 (12/15 0741) Resp:  [12-18] 18 (12/15 0741) BP: (113-163)/(49-88) 113/49 (12/15 0741) SpO2:  [93 %-99 %] 94 % (12/15 0741) Weight:  [70.3 kg] 70.3 kg (12/14 1021) Estimated body mass index is 28.35 kg/m as calculated from the following:   Height as of this encounter: 5' 2"  (1.575 m).   Weight as of this encounter: 70.3 kg.   Intake/Output from previous day: 12/14 0701 - 12/15 0700 In: 3000 [I.V.:3000] Out: 1860 [Urine:1150; Drains:410; Blood:300] Intake/Output this shift: Total I/O In: 240 [P.O.:240] Out: -   Physical exam the patient is alert and pleasant.  Her strength is normal.  Lab Results: Recent Labs    05/26/21 1009 05/29/21 0539  WBC 5.9 8.3  HGB 13.4 9.8*  HCT 41.4 29.9*  PLT 254 244   BMET Recent Labs    05/26/21 1009 05/29/21 0539  NA 138 138  K 4.0 4.5  CL 102 102  CO2 25 28  GLUCOSE 115* 165*  BUN 17 11  CREATININE 0.88 0.94  CALCIUM 9.3 8.6*    Studies/Results: DG Thoracic Spine 2 View  Result Date: 05/28/2021 CLINICAL DATA:  Fluoroscopic assistance for lower thoracic spinal fusion EXAM: THORACIC SPINE 2 VIEWS COMPARISON:  None. FINDINGS: Fluoroscopic images show posterior surgical fusion at 2 levels in the lower thoracic spine. Fluoroscopic time was 16 seconds. Estimated radiation dose is 4.2 mGy. IMPRESSION: Fluoroscopic assistance was provided for posterior fusion in the lower thoracic spine. Electronically Signed   By: Elmer Picker M.D.   On: 05/28/2021 16:16   DG C-Arm 1-60 Min-No Report  Result Date: 05/28/2021 Fluoroscopy was utilized by the requesting physician.  No radiographic interpretation.    Assessment/Plan: The patient is doing well.  We will mobilize with PT and OT.   She will likely go home tomorrow.  LOS: 1 day     Elaine Roach 05/29/2021, 8:13 AM     Patient ID: Elaine Roach, female   DOB: 1951-03-08, 70 y.o.   MRN: [id number]

## 2021-05-29 NOTE — Evaluation (Signed)
Physical Therapy Evaluation Patient Details Name: Elaine Roach MRN: [id number] DOB: February 19, 1951 Today's Date: 05/29/2021  History of Present Illness  70 y.o. female presents to [facility] hospital on 05/28/2021 with worsening back pain. Pt underwent T7-L1 PLIF on 05/28/2021. PMH includes OA, CAD, DMII, HTN, insomnia, PNA.  Clinical Impression  Pt presents to PT with deficits in balance and gait. Pt ambulates with increased drift laterally, reporting mild dizziness (BP stable). Some improvement in gait quality noted with increased ambulation distances, however not at baseline quality. Pt requires assistance to don TLSO brace at this time, reporting her sister can assist with this at the time of discharge. Pt will benefit from continued acute PT services to aide in restoring independence in mobility.       Recommendations for follow up therapy are one component of a multi-disciplinary discharge planning process, led by the attending physician.  Recommendations may be updated based on patient status, additional functional criteria and insurance authorization.  Follow Up Recommendations No PT follow up    Assistance Recommended at Discharge Intermittent Supervision/Assistance (assist for brace management)  Functional Status Assessment Patient has had a recent decline in their functional status and demonstrates the ability to make significant improvements in function in a reasonable and predictable amount of time.  Equipment Recommendations  None recommended by PT    Recommendations for Other Services       Precautions / Restrictions Precautions Precautions: Fall;Back Precaution Booklet Issued: Yes (comment) Required Braces or Orthoses: Spinal Brace Spinal Brace: Thoracolumbosacral orthotic;Applied in sitting position (pt needing minA to don brace) Restrictions Weight Bearing Restrictions: No      Mobility  Bed Mobility Overal bed mobility: Needs Assistance Bed Mobility: Rolling;Sidelying  to Sit Rolling: Supervision Sidelying to sit: Supervision            Transfers Overall transfer level: Independent                      Ambulation/Gait Ambulation/Gait assistance: Supervision Gait Distance (Feet): 200 Feet Assistive device: None Gait Pattern/deviations: Step-through pattern;Drifts right/left Gait velocity: reduced Gait velocity interpretation: 1.31 - 2.62 ft/sec, indicative of limited community ambulator   General Gait Details: pt with lateral drift to both sides  Stairs Stairs: Yes Stairs assistance: Supervision Stair Management: One rail Right;Forwards Number of Stairs: 8    Wheelchair Mobility    Modified Rankin (Stroke Patients Only)       Balance Overall balance assessment: Needs assistance Sitting-balance support: No upper extremity supported;Feet supported Sitting balance-Leahy Scale: Good     Standing balance support: No upper extremity supported;During functional activity Standing balance-Leahy Scale: Good                               Pertinent Vitals/Pain Pain Assessment: 0-10 Pain Score: 2  Pain Location: mid back Pain Descriptors / Indicators: Sore Pain Intervention(s): Monitored during session    Home Living Family/patient expects to be discharged to:: Private residence Living Arrangements: Alone Available Help at Discharge: Family;Available PRN/intermittently (sister, lives next door) Type of Home: House Home Access: Stairs to enter Entrance Stairs-Rails: Left Entrance Stairs-Number of Steps: 3   Home Layout: One level Home Equipment: Conservation officer, nature (2 wheels);Cane - single point;Shower seat      Prior Function Prior Level of Function : Independent/Modified Independent;Driving             Mobility Comments: PRN use of walker recently due to significant back  pain. Pt also reports limited ambulation recently because of pain       Hand Dominance        Extremity/Trunk Assessment    Upper Extremity Assessment Upper Extremity Assessment: Overall WFL for tasks assessed    Lower Extremity Assessment Lower Extremity Assessment: Overall WFL for tasks assessed    Cervical / Trunk Assessment Cervical / Trunk Assessment: Back Surgery  Communication   Communication: No difficulties  Cognition Arousal/Alertness: Awake/alert Behavior During Therapy: WFL for tasks assessed/performed Overall Cognitive Status: Within Functional Limits for tasks assessed                                          General Comments General comments (skin integrity, edema, etc.): VSS on RA    Exercises     Assessment/Plan    PT Assessment Patient needs continued PT services  PT Problem List Decreased balance;Decreased mobility       PT Treatment Interventions Gait training;Stair training;Balance training;Neuromuscular re-education;Patient/family education    PT Goals (Current goals can be found in the Care Plan section)  Acute Rehab PT Goals Patient Stated Goal: to return to independence PT Goal Formulation: With patient Time For Goal Achievement: 06/03/21 Potential to Achieve Goals: Good    Frequency Min 5X/week   Barriers to discharge        Co-evaluation               AM-PAC PT "6 Clicks" Mobility  Outcome Measure Help needed turning from your back to your side while in a flat bed without using bedrails?: A Little Help needed moving from lying on your back to sitting on the side of a flat bed without using bedrails?: A Little Help needed moving to and from a bed to a chair (including a wheelchair)?: None Help needed standing up from a chair using your arms (e.g., wheelchair or bedside chair)?: None Help needed to walk in hospital room?: A Little Help needed climbing 3-5 steps with a railing? : A Little 6 Click Score: 20    End of Session Equipment Utilized During Treatment: Back brace Activity Tolerance: Patient tolerated treatment well Patient  left: in bed;with call bell/phone within reach Nurse Communication: Mobility status PT Visit Diagnosis: Other abnormalities of gait and mobility (R26.89)    Time: 7017-7939 PT Time Calculation (min) (ACUTE ONLY): 19 min   Charges:   PT Evaluation $PT Eval Low Complexity: 1 Low          Zenaida Niece, PT, DPT Acute Rehabilitation Pager: [phone] Office [phone]   Zenaida Niece 05/29/2021, 9:26 AM

## 2021-05-30 LAB — GLUCOSE, CAPILLARY: Glucose-Capillary: 94 mg/dL (ref 70–99)

## 2021-05-30 MED ORDER — CYCLOBENZAPRINE HCL 10 MG PO TABS: 10.0000 mg | ORAL_TABLET | Freq: Three times a day (TID) | ORAL | 0 refills | Status: AC | PRN

## 2021-05-30 MED ORDER — OXYCODONE-ACETAMINOPHEN 10-325 MG PO TABS: 1.0000 | ORAL_TABLET | ORAL | 0 refills | Status: DC | PRN

## 2021-05-30 NOTE — Discharge Instructions (Addendum)
Wound Care Keep incision covered and dry for two days.    Do not put any creams, lotions, or ointments on incision. Leave steri-strips on back.  They will fall off by themselves.  Activity Walk each and every day, increasing distance each day. No lifting greater than 5 lbs.  Avoid excessive back motion. No driving for 2 weeks; may ride as a passenger locally.  Diet Resume your normal diet.    Call Your Doctor If Any of These Occur Redness, drainage, or swelling at the wound.  Temperature greater than 101 degrees. Severe pain not relieved by pain medication. Incision starts to come apart.  Follow Up Appt Call [phone] for any problems.  If you have any hardware placed in your spine, you will need an x-ray before your appointment.

## 2021-05-30 NOTE — Progress Notes (Signed)
PT Cancellation Note  Patient Details Name: Elaine Roach MRN: [id number] DOB: 11-Dec-1950   Cancelled Treatment:    Reason Eval/Treat Not Completed: Patient declined, no reason specified. Pt reports she has ambulated multiple times in the hallway this morning, denies concerns about mobility or brace use and is able to verbalize back precautions. Pt declines the need for PT intervention at this time and is eager to discharge.   Zenaida Niece 05/30/2021, 11:27 AM

## 2021-05-30 NOTE — Discharge Summary (Signed)
Physician Discharge Summary     Providing Compassionate, Quality Care - Together   Patient ID: Elaine Roach MRN: [id number] DOB/AGE: 1951/04/05 70 y.o.  Admit date: 05/28/2021 Discharge date: 05/30/2021  Admission Diagnoses: DDD, thoracic  Discharge Diagnoses:  Principal Problem:   DDD (degenerative disc disease), thoracic   Discharged Condition: good  Hospital Course: Patient underwent extension of her fusion to T7 by Dr. Arnoldo Morale on 05/28/2021. She was admitted to [facility] following recovery from anesthesia in the PACU. Her postoperative course has been uncomplicated. She has worked with both physical and occupational therapies who feel the patient is ready for discharge home. She is ambulating independently and without difficulty. She is tolerating a normal diet. She is not having any bowel or bladder dysfunction. Her pain is well-controlled with oral pain medication. She is ready for discharge home.   Consults: PT, OT  Significant Diagnostic Studies: radiology: DG Thoracic Spine 2 View  Result Date: 05/28/2021 CLINICAL DATA:  Fluoroscopic assistance for lower thoracic spinal fusion EXAM: THORACIC SPINE 2 VIEWS COMPARISON:  None. FINDINGS: Fluoroscopic images show posterior surgical fusion at 2 levels in the lower thoracic spine. Fluoroscopic time was 16 seconds. Estimated radiation dose is 4.2 mGy. IMPRESSION: Fluoroscopic assistance was provided for posterior fusion in the lower thoracic spine. Electronically Signed   By: Elmer Picker M.D.   On: 05/28/2021 16:16   DG C-Arm 1-60 Min-No Report  Result Date: 05/28/2021 Fluoroscopy was utilized by the requesting physician.  No radiographic interpretation.     Treatments: surgery: Exploration of thoracolumbar fusion; right T10-11 laminotomy/discectomy; posterior lateral arthrodesis T7-8, T8-9, T9-10 and T10-11 using local morselized autograft bone and Trinity bone graft extender; posterior segmental instrumentation T7-L1  with globus titanium pedicle screws and rods.   Discharge Exam: Blood pressure (!) 112/45, pulse 68, temperature 99 F (37.2 C), temperature source Oral, resp. rate 18, height 5' 2"  (1.575 m), weight 70.3 kg, SpO2 94 %.  Alert and oriented x 4 PERRLA CN II-XII grossly intact MAE, Strength and sensation intact Incision is covered with Honeycomb dressing and Steri Strips; Dressing is clean, dry, and intact   Disposition: Discharge disposition: 01-Home or Self Care        Allergies as of 05/30/2021       Reactions   Codeine Itching, Swelling   Facial swelling   Lisinopril Hives   Hydrocodone Bit-homatrop Mbr Rash, Itching   Tape Rash   Surgical tape         Medication List     TAKE these medications    aspirin 81 MG EC tablet Take 81 mg by mouth at bedtime.   cyclobenzaprine 10 MG tablet Commonly known as: FLEXERIL Take 1 tablet (10 mg total) by mouth 3 (three) times daily as needed for muscle spasms.   diltiazem 240 MG 24 hr capsule Commonly known as: CARDIZEM CD TAKE 1 CAPSULE BY MOUTH DAILY   docusate sodium 100 MG capsule Commonly known as: COLACE Take 1 capsule (100 mg total) by mouth 2 (two) times daily.   ezetimibe 10 MG tablet Commonly known as: ZETIA Take 1 tablet (10 mg total) by mouth every evening.   ferrous sulfate 325 (65 FE) MG tablet Take 325 mg by mouth daily with breakfast.   glucose blood test strip 1 strip by Misc.(Non-Drug; Combo Route) route daily.   hydroxypropyl methylcellulose / hypromellose 2.5 % ophthalmic solution Commonly known as: ISOPTO TEARS / GONIOVISC Place 1 drop into both eyes 3 (three) times daily as needed for dry  eyes.   isosorbide mononitrate 60 MG 24 hr tablet Commonly known as: IMDUR Take 1 tablet (60 mg total) by mouth daily.   losartan 50 MG tablet Commonly known as: COZAAR Take 50 mg by mouth daily as needed (Blood Pressure is greater than 160 on top).   metFORMIN 500 MG tablet Commonly known as:  GLUCOPHAGE Take 500 mg by mouth 2 (two) times daily with a meal.   mirabegron ER 25 MG Tb24 tablet Commonly known as: MYRBETRIQ Take 25 mg by mouth daily.   nitroGLYCERIN 0.4 MG SL tablet Commonly known as: NITROSTAT Place 0.4 mg under the tongue every 5 (five) minutes as needed for chest pain.   omeprazole 40 MG capsule Commonly known as: PRILOSEC Take 40 mg by mouth every morning.   oxyCODONE-acetaminophen 10-325 MG tablet Commonly known as: Percocet Take 1 tablet by mouth every 4 (four) hours as needed for pain (Postoperative pain).   ranolazine 500 MG 12 hr tablet Commonly known as: RANEXA Take 1 tablet (500 mg total) by mouth 2 (two) times daily.   sertraline 100 MG tablet Commonly known as: ZOLOFT Take 100 mg by mouth at bedtime.   vitamin B-12 500 MCG tablet Commonly known as: CYANOCOBALAMIN Take 500 mcg by mouth daily.   Vitamin D3 125 MCG (5000 UT) Caps Take 5,000 Units by mouth daily.   zolpidem 10 MG tablet Commonly known as: AMBIEN Take 10 mg by mouth at bedtime.        Follow-up Information     Newman Pies, MD. Go on 06/24/2021.   Specialty: Neurosurgery Why: First post op appointment with x-rays is 06/24/2021 at 1:45 PM. Contact information: [address] [place] [address] [phone]                 Signed: Viona Gilmore, DNP, AGNP-C Nurse Practitioner  [facility] [place] [address], [address], [place], [place] [address] P: [phone]     F: [phone]  05/30/2021, 9:19 AM

## 2021-05-30 NOTE — Progress Notes (Signed)
Patient alert and oriented, mae's well, voiding adequate amount of urine, swallowing without difficulty, no c/o pain at time of discharge. Patient discharged home with family. Script and discharged instructions given to patient. Patient and family stated understanding of instructions given. Patient has an appointment with Arnoldo Morale in 3 weeks

## 2021-06-13 ENCOUNTER — Encounter (HOSPITAL_COMMUNITY): Payer: Self-pay | Admitting: Neurosurgery

## 2021-07-04 DIAGNOSIS — Z789 Other specified health status: Secondary | ICD-10-CM | POA: Insufficient documentation

## 2021-07-04 DIAGNOSIS — R110 Nausea: Secondary | ICD-10-CM | POA: Insufficient documentation

## 2021-08-14 ENCOUNTER — Other Ambulatory Visit: Payer: Self-pay | Admitting: Family Medicine

## 2021-08-14 ENCOUNTER — Other Ambulatory Visit: Payer: Self-pay | Admitting: Obstetrics and Gynecology

## 2021-08-20 ENCOUNTER — Other Ambulatory Visit: Payer: Self-pay | Admitting: Obstetrics and Gynecology

## 2021-08-20 DIAGNOSIS — Z13820 Encounter for screening for osteoporosis: Secondary | ICD-10-CM

## 2021-08-20 DIAGNOSIS — Z1231 Encounter for screening mammogram for malignant neoplasm of breast: Secondary | ICD-10-CM

## 2021-08-23 ENCOUNTER — Other Ambulatory Visit: Payer: Self-pay | Admitting: Cardiology

## 2021-08-27 ENCOUNTER — Other Ambulatory Visit: Payer: Self-pay | Admitting: Obstetrics and Gynecology

## 2021-08-27 ENCOUNTER — Other Ambulatory Visit: Payer: Self-pay

## 2021-08-27 ENCOUNTER — Ambulatory Visit
Admission: RE | Admit: 2021-08-27 | Discharge: 2021-08-27 | Disposition: A | Discharge status: Home / Self Care | Payer: PPO | Source: Ambulatory Visit | Attending: Obstetrics and Gynecology | Admitting: Obstetrics and Gynecology

## 2021-08-27 DIAGNOSIS — N6310 Unspecified lump in the right breast, unspecified quadrant: Secondary | ICD-10-CM

## 2021-08-27 DIAGNOSIS — Z1231 Encounter for screening mammogram for malignant neoplasm of breast: Secondary | ICD-10-CM

## 2021-09-02 ENCOUNTER — Other Ambulatory Visit: Payer: Self-pay | Admitting: Obstetrics and Gynecology

## 2021-09-02 DIAGNOSIS — N6310 Unspecified lump in the right breast, unspecified quadrant: Secondary | ICD-10-CM

## 2021-09-19 ENCOUNTER — Ambulatory Visit
Admission: RE | Admit: 2021-09-19 | Discharge: 2021-09-19 | Disposition: A | Discharge status: Home / Self Care | Payer: PPO | Source: Ambulatory Visit | Attending: Obstetrics and Gynecology | Admitting: Obstetrics and Gynecology

## 2021-09-19 DIAGNOSIS — N6310 Unspecified lump in the right breast, unspecified quadrant: Secondary | ICD-10-CM

## 2021-10-03 ENCOUNTER — Telehealth: Payer: Self-pay

## 2021-10-03 ENCOUNTER — Emergency Department (HOSPITAL_COMMUNITY)
Admission: EM | Admit: 2021-10-03 | Discharge: 2021-10-03 | Disposition: A | Discharge status: Home / Self Care | Payer: PPO | Attending: Emergency Medicine | Admitting: Emergency Medicine

## 2021-10-03 ENCOUNTER — Encounter: Payer: Self-pay | Admitting: Cardiology

## 2021-10-03 ENCOUNTER — Other Ambulatory Visit: Payer: Self-pay

## 2021-10-03 ENCOUNTER — Ambulatory Visit: Payer: PPO | Admitting: Cardiology

## 2021-10-03 ENCOUNTER — Emergency Department (HOSPITAL_COMMUNITY): Payer: PPO

## 2021-10-03 ENCOUNTER — Encounter (HOSPITAL_COMMUNITY): Payer: Self-pay

## 2021-10-03 VITALS — BP 132/72 | HR 72 | Ht 63.0 in | Wt 148.2 lb

## 2021-10-03 DIAGNOSIS — E119 Type 2 diabetes mellitus without complications: Secondary | ICD-10-CM | POA: Insufficient documentation

## 2021-10-03 DIAGNOSIS — Z7984 Long term (current) use of oral hypoglycemic drugs: Secondary | ICD-10-CM | POA: Diagnosis not present

## 2021-10-03 DIAGNOSIS — E782 Mixed hyperlipidemia: Secondary | ICD-10-CM

## 2021-10-03 DIAGNOSIS — Z7982 Long term (current) use of aspirin: Secondary | ICD-10-CM | POA: Diagnosis not present

## 2021-10-03 DIAGNOSIS — I10 Essential (primary) hypertension: Secondary | ICD-10-CM

## 2021-10-03 DIAGNOSIS — I2510 Atherosclerotic heart disease of native coronary artery without angina pectoris: Secondary | ICD-10-CM | POA: Diagnosis not present

## 2021-10-03 DIAGNOSIS — I951 Orthostatic hypotension: Secondary | ICD-10-CM | POA: Diagnosis not present

## 2021-10-03 DIAGNOSIS — R072 Precordial pain: Secondary | ICD-10-CM | POA: Insufficient documentation

## 2021-10-03 DIAGNOSIS — I209 Angina pectoris, unspecified: Secondary | ICD-10-CM | POA: Diagnosis not present

## 2021-10-03 DIAGNOSIS — R0609 Other forms of dyspnea: Secondary | ICD-10-CM | POA: Diagnosis not present

## 2021-10-03 DIAGNOSIS — R112 Nausea with vomiting, unspecified: Secondary | ICD-10-CM | POA: Diagnosis not present

## 2021-10-03 DIAGNOSIS — M549 Dorsalgia, unspecified: Secondary | ICD-10-CM | POA: Diagnosis not present

## 2021-10-03 DIAGNOSIS — R5383 Other fatigue: Secondary | ICD-10-CM | POA: Insufficient documentation

## 2021-10-03 DIAGNOSIS — I25118 Atherosclerotic heart disease of native coronary artery with other forms of angina pectoris: Secondary | ICD-10-CM | POA: Diagnosis not present

## 2021-10-03 DIAGNOSIS — R0602 Shortness of breath: Secondary | ICD-10-CM | POA: Diagnosis not present

## 2021-10-03 LAB — BASIC METABOLIC PANEL
Anion gap: 9 (ref 5–15)
BUN: 14 mg/dL (ref 8–23)
CO2: 25 mmol/L (ref 22–32)
Calcium: 9.1 mg/dL (ref 8.9–10.3)
Chloride: 105 mmol/L (ref 98–111)
Creatinine, Ser: 0.84 mg/dL (ref 0.44–1.00)
GFR, Estimated: >60 mL/min (ref >60)
Glucose, Bld: 96 mg/dL (ref 70–99)
Potassium: 3.4 mmol/L — ABNORMAL LOW (ref 3.5–5.1)
Sodium: 139 mmol/L (ref 135–145)

## 2021-10-03 LAB — BRAIN NATRIURETIC PEPTIDE: B Natriuretic Peptide: 24.3 pg/mL (ref 0.0–100.0)

## 2021-10-03 LAB — HEPATIC FUNCTION PANEL
ALT: 50 U/L — ABNORMAL HIGH (ref 0–44)
AST: 61 U/L — ABNORMAL HIGH (ref 15–41)
Albumin: 3.9 g/dL (ref 3.5–5.0)
Alkaline Phosphatase: 94 U/L (ref 38–126)
Bilirubin, Direct: 0.1 mg/dL (ref 0.0–0.2)
Indirect Bilirubin: 1.3 mg/dL — ABNORMAL HIGH (ref 0.3–0.9)
Total Bilirubin: 1.4 mg/dL — ABNORMAL HIGH (ref 0.3–1.2)
Total Protein: 6.5 g/dL (ref 6.5–8.1)

## 2021-10-03 LAB — CBC
HCT: 34.9 % — ABNORMAL LOW (ref 36.0–46.0)
Hemoglobin: 11.5 g/dL — ABNORMAL LOW (ref 12.0–15.0)
MCH: 28.3 pg (ref 26.0–34.0)
MCHC: 33 g/dL (ref 30.0–36.0)
MCV: 86 fL (ref 80.0–100.0)
Platelets: 241 10*3/uL (ref 150–400)
RBC: 4.06 MIL/uL (ref 3.87–5.11)
RDW: 14.7 % (ref 11.5–15.5)
WBC: 5.2 10*3/uL (ref 4.0–10.5)
nRBC: 0 % (ref 0.0–0.2)

## 2021-10-03 LAB — TROPONIN I (HIGH SENSITIVITY)
Troponin I (High Sensitivity): 3 ng/L (ref <18)
Troponin I (High Sensitivity): 3 ng/L (ref <18)

## 2021-10-03 LAB — LIPASE, BLOOD: Lipase: 30 U/L (ref 11–51)

## 2021-10-03 MED ORDER — POTASSIUM CHLORIDE CRYS ER 20 MEQ PO TBCR
40.0000 meq | EXTENDED_RELEASE_TABLET | Freq: Once | ORAL | Status: AC
  Administered 2021-10-03: 40 meq via ORAL
  Filled 2021-10-03: qty 2

## 2021-10-03 MED ORDER — OMEPRAZOLE 40 MG PO CPDR: 40.0000 mg | DELAYED_RELEASE_CAPSULE | Freq: Two times a day (BID) | ORAL | 3 refills | Status: AC

## 2021-10-03 NOTE — Telephone Encounter (Signed)
Patient came to our office for a follow up office visit after being in the ER at [facility] this past weekend. Patient was seen by Dr. Bettina Gavia and orders were given for her to drive to [facility] ER and be seen and evaluated there. Patient stated that she could drive there and she left the office. About 30 minutes later the patient returned to the office and stated that she did not think she could make the drive. She was brought back to a room and she was very dizzy and light headed and stated that she was having chest pressure like an elephant sitting on her chest with a pain score of 4 out of 10. 911 was called and the patient was hooked up to the EKG machine for continuous monitoring. The patient was monitored and did not progress with their symptoms until EMS arrived. EMS arrived and took over the care of the patient. They called a county ambulance to transport the patient to [facility] because Dr. Bettina Gavia wanted her to go there. When the county ambulance arrived they assessed the patient and transported the patient to [facility]. ?

## 2021-10-03 NOTE — ED Notes (Signed)
Cardiology at bedside at this time to assess patient ?

## 2021-10-03 NOTE — Progress Notes (Signed)
?Cardiology Office Note:   ? ?She left our office was in her drive to [facility].  She felt increasingly weak we treated her as a rapid response.  The patient room looked up to the EKG EMS called and she will be transferred to [facility] ED ? ? ? ? ?Date:  10/03/2021  ? ?ID:  Elaine Roach, DOB 1951/01/12, MRN [id number] ? ?PCP:  Algis Greenhouse, MD  ?Cardiologist:  Shirlee More, MD   ? ?Referring MD: Algis Greenhouse, MD  ? ? ?ASSESSMENT:   ? ?1. Mild CAD   ?2. Essential hypertension   ?3. Orthostatic hypotension   ?4. Mixed hyperlipidemia   ? ?PLAN:   ? ?In order of problems listed above: ? ?Her presentation is not typical of GI reflux am suspicious that this is troponin normal angina becoming unstable.  EKG is subtly abnormal I think she is best served by direct referral the ED [facility] check high-sensitivity troponin and may require repeat coronary angiography she agrees.  I think she is safe to drive there ? ? ?Next appointment: As needed following reevaluation ? ? ?Medication Adjustments/Labs and Tests Ordered: ?Current medicines are reviewed at length with the patient today.  Concerns regarding medicines are outlined above.  ?No orders of the defined types were placed in this encounter. ? ?No orders of the defined types were placed in this encounter. ? ? ?Chief complaint I am very concerned and seen in the emergency room Tuesday at [facility] and continue to have symptoms she uses a clenched fist her chest to describe her heart for ? ?History of Present Illness:   ? ?Elaine Roach is a 71 y.o. female with a hx of  mild CAD diabetes hypertension hyperlipidemia and orthostatic hypotension  last seen 04/03/2021. ?Coronary angiogram performed 04/27/2018 showed normal left ventricular function EF greater than 65% she had mild CAD most severe stenosis of the mid right coronary artery 65%. ? ?Compliance with diet, lifestyle and medications: yes ? ?Recent labs 07/04/2021: Ferritin  decreased 23 iron saturation decreased 14% ?03/20/2021 GFR 71 cc sodium 141 potassium 5.0 ?Hemoglobin 12.6 platelets 191,000 ?01/01/2021: LDL 32 cholesterol 103 HDL 53 non-HDL cholesterol 50 ? ?She was seen in [facility] ED 09/30/2021 for what she calls heartburn which she describes as severe substernal pain rating through to her back unrelieved with nitroglycerin associated shortness of breath nausea dizziness and feeling weak.  Her CBC was normal hemoglobin was 13.2 BMP was normal creatinine was 0.6.  She had a initial and repeat troponin both undetectable.  Chest x-ray was read as no active disease.  She had CTA chest abdomen which showed no evidence of aortopathy no pulmonary embolism she was noted to have coronary artery calcification aortic atherosclerosis there is narrowing in the proximal celiac trunk noted and findings most cholecystectomy. ? ?Her EKG shows a pattern of possible old anterior septal MI no acute ST-T changes and this finding is different compared to last October ? ?Today she does not feel well she still has vague mild discomfort and I told her I think what she should do is go directly to the emergency room and [facility] I will call ahead she needs to have high-sensitivity troponin may need to have repeat coronary angiography.  She feels comfortable driving check she drove to [facility] to be seen by a back surgeon yesterday. ? ?Past Medical History:  ?Diagnosis Date  ? Arthritis   ? Carotid artery calcification   ?  Complication of anesthesia   ? pt. woke up during hand surgery and colonoscopy, cataract surgery  ? Coronary artery disease   ? Diabetes mellitus without complication ([place])   ? type 2    dx 10 yrs ago  ? Dysrhythmia   ? BENIGN PVC'S  ? GERD (gastroesophageal reflux disease)   ? Headache   ? History of OCD (obsessive compulsive disorder)   ? TO A DEGREE  ? Hypertension   ? Insomnia   ? Mixed hyperlipidemia   ? Nonalcoholic steatohepatitis ([facility])   ? DX IN 1995  ?  Osteoarthritis   ? Pneumonia   ? walking pneu. in the past  ? PONV (postoperative nausea and vomiting)   ? ? ?Past Surgical History:  ?Procedure Laterality Date  ? ANTERIOR CERVICAL DECOMP/DISCECTOMY FUSION  07/19/2017  ? Procedure: ANTERIOR CERVICAL DECOMPRESSION/DISCECTOMY FUSION , INTERBODY PROSTHESIS, ANTERIOR PLATE CERVICAL SIX- CERVICAL SEVEN, CERVICAL SEVEN- THORACIC ONE, EXPLORE OLD FUSION;  Surgeon: Newman Pies, MD;  Location: [facility];  Service: Neurosurgery;;  ? BACK SURGERY    ? x3  ? BUNIONECTOMY    ? RIGHT  ? CARDIAC CATHETERIZATION    ? CARPAL TUNNEL RELEASE    ? RIGHT  ? CHOLECYSTECTOMY    ? GANGLION CYST EXCISION Right   ? LEFT HEART CATH AND CORONARY ANGIOGRAPHY N/A 04/27/2018  ? Procedure: LEFT HEART CATH AND CORONARY ANGIOGRAPHY;  Surgeon: Leonie Man, MD;  Location: [place] CV LAB;  Service: Cardiovascular;  Laterality: N/A;  ? MYELOGRAM    ? SHOULDER ARTHROSCOPY W/ ROTATOR CUFF REPAIR    ? 4 SCOPES.Marland KitchenMarland KitchenALL ON THE RIGHT  ? TUBAL LIGATION    ? WOUND EXPLORATION  03/19/2015  ? CERVICAL WOUND   ? WOUND EXPLORATION N/A 03/19/2015  ? Procedure: CERVICAL WOUND EXPLORATION, EVACUATION OF CERVICAL HEMATOMA;  Surgeon: Newman Pies, MD;  Location: [place] [facility] NEURO ORS;  Service: Neurosurgery;  Laterality: N/A;  cervical wound exploration, evacuation of cervical hematoma  ? ? ?Current Medications: ?No outpatient medications have been marked as taking for the 10/03/21 encounter (Appointment) with Richardo Priest, MD.  ?  ? ?Allergies:   Codeine, Lisinopril, Hydrocodone bit-homatrop mbr, and Tape  ? ?Social History  ? ?Socioeconomic History  ? Marital status: Divorced  ?  Spouse name: Not on file  ? Number of children: Not on file  ? Years of education: Not on file  ? Highest education level: Not on file  ?Occupational History  ? Not on file  ?Tobacco Use  ? Smoking status: Former  ?  Packs/day: 2.00  ?  Years: 17.00  ?  Pack years: 34.00  ?  Types: Cigarettes  ? Smokeless tobacco: Never  ? Tobacco comments:   ?  quit when she was 35  ?Vaping Use  ? Vaping Use: Never used  ?Substance and Sexual Activity  ? Alcohol use: Yes  ?  Comment: OCCASIONAL  WINE  ? Drug use: No  ? Sexual activity: Not on file  ?Other Topics Concern  ? Not on file  ?Social History Narrative  ? Not on file  ? ?Social Determinants of Health  ? ?Financial Resource Strain: Not on file  ?Food Insecurity: Not on file  ?Transportation Needs: Not on file  ?Physical Activity: Not on file  ?Stress: Not on file  ?Social Connections: Not on file  ?  ? ?Family History: ?The patient's family history includes Breast cancer in her maternal grandmother and mother; Heart Problems in her father; Heart failure in her  paternal grandfather and paternal grandmother. ?ROS:   ?Please see the history of present illness.    ?All other systems reviewed and are negative. ? ?EKGs/Labs/Other Studies Reviewed:   ? ?The following studies were reviewed today: ?Coronary angiography 04/29/2018 showed mild to moderate diffuse CAD most severe lesion 70% eccentric stenosis in a small caliber codominant right coronary artery treated medically. ?  ?LEFT HEART CATH AND CORONARY ANGIOGRAPHY  ?  ?Conclusion ?There is hyperdynamic left ventricular systolic function. ?LV end diastolic pressure is normal. ?The left ventricular ejection fraction is greater than 65% by visual estimate. ?There is no aortic valve stenosis. ?Mid Cx lesion is 45% stenosed. ?Ost Cx to Prox Cx lesion is 20% stenosed. ?Prox LAD to Mid LAD lesion is 40% stenosed. ?Mid RCA lesion is 65% stenosed. ?Ost LM to Prox LAD lesion is 5% stenosed. ?  ?SUMMARY ?Mild-Moderate diffuse CAD consistent with diabetic coronary arteries. ?Most significant lesion being a 70% eccentric lesion in the relatively small caliber codominant RCA (recommend medical management, not positive by CT FFR) ?Suspect abnormal coronary CTA FFR result was related to diffuse mild disease as opposed to any specific significant lesion. ?Normal LVEDP with  hyperdynamic left ventricle. ?  ?RECOMMENDATION ?Optimize medical management -consider adding long-acting nitrate versus Ranexa. ?Aggressive cardiovascular risk modification. ?Recommend Aspirin 65m daily for

## 2021-10-03 NOTE — Addendum Note (Signed)
Addended by: Hadlei Stitt, Jonelle Sidle L on: 10/03/2021 03:51 PM ? ? Modules accepted: Orders ? ?

## 2021-10-03 NOTE — ED Provider Notes (Signed)
?[facility] ?Provider Note ? ? ?CSN: [id number] ?Arrival date & time: 10/03/21  1302 ? ?  ? ?History ? ?Chief Complaint  ?Patient presents with  ? Chest Pain  ? ? ?Elaine Roach is a 71 y.o. female. ? ?The history is provided by the patient and medical records. No language interpreter was used.  ?Chest Pain ?Pain location:  Substernal area ?Pain quality: aching, burning, dull, pressure and tightness   ?Pain radiates to:  Upper back ?Pain severity:  Moderate ?Onset quality:  Gradual ?Duration:  1 week ?Timing:  Constant ?Progression:  Waxing and waning ?Chronicity:  New ?Relieved by:  Nothing ?Worsened by:  Exertion ?Ineffective treatments:  None tried ?Associated symptoms: back pain, fatigue, nausea, shortness of breath and vomiting   ?Associated symptoms: no abdominal pain, no anxiety, no cough, no diaphoresis, no dizziness, no fever, no headache, no lower extremity edema, no near-syncope, no numbness, no palpitations and no weakness   ?Risk factors: coronary artery disease   ? ?  ? ?Home Medications ?Prior to Admission medications   ?Medication Sig Start Date End Date Taking? Authorizing Provider  ?aspirin 81 MG EC tablet Take 81 mg by mouth at bedtime.    [provider]  ?atorvastatin (LIPITOR) 80 MG tablet Take 80 mg by mouth daily. 05/27/21   [provider]  ?Cholecalciferol (VITAMIN D3) 5000 units CAPS Take 5,000 Units by mouth daily.    [provider]  ?cyclobenzaprine (FLEXERIL) 10 MG tablet Take 1 tablet (10 mg total) by mouth 3 (three) times daily as needed for muscle spasms. 05/30/21   Viona Gilmore D, NP  ?diltiazem (CARDIZEM CD) 240 MG 24 hr capsule TAKE 1 CAPSULE BY MOUTH DAILY 12/03/20   Richardo Priest, MD  ?docusate sodium (COLACE) 100 MG capsule Take 1 capsule (100 mg total) by mouth 2 (two) times daily. 08/30/20   Newman Pies, MD  ?ezetimibe (ZETIA) 10 MG tablet TAKE 1 TABLET(10 MG) BY MOUTH EVERY EVENING 08/25/21   Richardo Priest, MD  ?glucose blood test strip 1 strip by Misc.(Non-Drug; Combo Route) route daily. 07/06/17   [provider]  ?hydroxypropyl methylcellulose / hypromellose (ISOPTO TEARS / GONIOVISC) 2.5 % ophthalmic solution Place 1 drop into both eyes 3 (three) times daily as needed for dry eyes.    [provider]  ?isosorbide mononitrate (IMDUR) 60 MG 24 hr tablet Take 1 tablet (60 mg total) by mouth daily. 08/28/20   Richardo Priest, MD  ?losartan (COZAAR) 50 MG tablet Take 50 mg by mouth daily as needed (Blood Pressure is greater than 160 on top).    [provider]  ?metFORMIN (GLUCOPHAGE) 500 MG tablet Take 500 mg by mouth 2 (two) times daily with a meal.    [provider]  ?nitroGLYCERIN (NITROSTAT) 0.4 MG SL tablet Place 0.4 mg under the tongue every 5 (five) minutes as needed for chest pain.    [provider]  ?omeprazole (PRILOSEC) 40 MG capsule Take 40 mg by mouth every morning.     [provider]  ?oxyCODONE-acetaminophen (PERCOCET) 10-325 MG tablet Take 1 tablet by mouth every 4 (four) hours as needed for pain (Postoperative pain). 05/30/21 05/30/22  Viona Gilmore D, NP  ?ranolazine (RANEXA) 500 MG 12 hr tablet Take 1 tablet (500 mg total) by mouth 2 (two) times daily. 11/29/20   Richardo Priest, MD  ?sertraline (ZOLOFT) 100 MG tablet Take 100 mg by mouth at bedtime.    [provider]  ?sucralfate (CARAFATE) 1 g tablet Take 1 g by mouth at bedtime. 10/01/21   [provider]  ?vitamin B-12 (CYANOCOBALAMIN) 500 MCG tablet Take 500 mcg by mouth daily.    [provider]  ?zolpidem (AMBIEN) 5 MG tablet Take 5 mg by mouth at bedtime as needed for sleep. 09/15/21   [provider]  ?   ? ?Allergies    ?Codeine, Lisinopril, Atorvastatin, and Tape   ? ?Review of Systems   ?Review of Systems  ?Constitutional:  Positive for fatigue. Negative for chills, diaphoresis and fever.  ?HENT:  Negative for congestion.   ?Respiratory:   Positive for shortness of breath. Negative for cough and chest tightness.   ?Cardiovascular:  Positive for chest pain. Negative for palpitations and near-syncope.  ?Gastrointestinal:  Positive for nausea and vomiting. Negative for abdominal pain, constipation and diarrhea.  ?Genitourinary:  Negative for dysuria, flank pain and frequency.  ?Musculoskeletal:  Positive for back pain. Negative for neck pain.  ?Skin:  Negative for rash and wound.  ?Neurological:  Negative for dizziness, weakness, light-headedness, numbness and headaches.  ?Psychiatric/Behavioral:  Negative for agitation and confusion.   ?All other systems reviewed and are negative. ? ?Physical Exam ?Updated Vital Signs ?BP 140/69 (BP Location: Right Arm)   Pulse 69   Temp 98.4 ?F (36.9 ?C) (Oral)   Resp 18   Ht 5' 3"  (1.6 m)   Wt 64.9 kg   BMI 25.33 kg/m?  ?Physical Exam ?Vitals and nursing note reviewed.  ?Constitutional:   ?   General: She is not in acute distress. ?   Appearance: She is well-developed. She is not ill-appearing, toxic-appearing or diaphoretic.  ?HENT:  ?   Head: Normocephalic and atraumatic.  ?Eyes:  ?   Conjunctiva/sclera: Conjunctivae normal.  ?   Pupils: Pupils are equal, round, and reactive to light.  ?Cardiovascular:  ?   Rate and Rhythm: Normal rate and regular rhythm.  ?   Heart sounds: Murmur heard.  ?Pulmonary:  ?   Effort: Pulmonary effort is normal. No respiratory distress.  ?   Breath sounds: Normal breath sounds. No decreased breath sounds, wheezing, rhonchi or rales.  ?Chest:  ?   Chest wall: No tenderness.  ?Abdominal:  ?   Palpations: Abdomen is soft.  ?   Tenderness: There is no abdominal tenderness.  ?Musculoskeletal:     ?   General: No swelling.  ?   Cervical back: Neck supple.  ?   Right lower leg: No tenderness. No edema.  ?   Left lower leg: No tenderness. No edema.  ?Skin: ?   General: Skin is warm and dry.  ?   Capillary Refill: Capillary refill takes less than 2 seconds.  ?Neurological:  ?   Mental  Status: She is alert.  ?Psychiatric:     ?   Mood and Affect: Mood normal.  ? ? ?ED Results / Procedures / Treatments   ?Labs ?(all labs ordered are listed, but only abnormal results are displayed) ?Labs Reviewed  ?BASIC METABOLIC PANEL - Abnormal; Notable for the following components:  ?    Result Value  ? Potassium 3.4 (*)   ? All other components within normal limits  ?CBC - Abnormal; Notable for the following components:  ? Hemoglobin 11.5 (*)   ? HCT 34.9 (*)   ? All other components within normal limits  ?HEPATIC FUNCTION PANEL - Abnormal; Notable for the following components:  ? AST 61 (*)   ?  ALT 50 (*)   ? Total Bilirubin 1.4 (*)   ? Indirect Bilirubin 1.3 (*)   ? All other components within normal limits  ?LIPASE, BLOOD  ?BRAIN NATRIURETIC PEPTIDE  ?TROPONIN I (HIGH SENSITIVITY)  ?TROPONIN I (HIGH SENSITIVITY)  ? ? ?EKG ?EKG Interpretation ? ?Date/Time:  Friday October 03 2021 13:08:51 EDT ?Ventricular Rate:  67 ?PR Interval:  156 ?QRS Duration: 102 ?QT Interval:  424 ?QTC Calculation: 448 ?R Axis:   40 ?Text Interpretation: Sinus rhythm RSR' in V1 or V2, right VCD or RVH when compared to prior, similar appearance. No STEMI Confirmed by Antony Blackbird [phone]) on 10/03/2021 1:50:57 PM ? ?Radiology ?DG Chest 2 View ? ?Result Date: 10/03/2021 ?CLINICAL DATA:  Chest pain EXAM: CHEST - 2 VIEW COMPARISON:  None. FINDINGS: The heart size and mediastinal contours are within normal limits. Both lungs are clear. Extensive posterior spinal fusion of the lower thoracic spine. Anterior cervical discectomy and fusion hardware. Surgical anchor about the right humeral head. IMPRESSION: No active cardiopulmonary disease. Electronically Signed   By: Keane Police D.O.   On: 10/03/2021 13:44   ? ?Procedures ?Procedures  ? ? ?Medications Ordered in ED ?Medications - No data to display ? ?ED Course/ Medical Decision Making/ A&P ?  ?                        ?Medical Decision Making ?Amount and/or Complexity of Data Reviewed ?Labs:  ordered. ?Radiology: ordered. ? ? ?NEOSHA SWITALSKI is a 71 y.o. female with a past medical history significant for hypertension, hyperlipidemia, CAD, GERD, diabetes, carotid disease, and previous cholecystectomy w

## 2021-10-03 NOTE — Discharge Instructions (Signed)
If you develop recurrent, continued, or worsening chest pain, shortness of breath, fever, vomiting, abdominal or back pain, or any other new/concerning symptoms then return to the ER for evaluation.  

## 2021-10-03 NOTE — ED Triage Notes (Signed)
[facility] reports pt is coming from doctors office for routine check up. Pt has been having chest pain since Sunday, described it as a pressure, also having pain in back between her shoulder blades, sob on exertion. ?

## 2021-10-03 NOTE — H&P (View-Only) (Signed)
?Cardiology Consult:  ? ?Patient ID: Elaine Roach ?MRN: [id number]; DOB: 1951-05-27  ? ?Admission date: 10/03/2021 ? ?PCP:  Algis Greenhouse, MD ?  ?[facility] Prov[facility] ?Cardiologist:  Shirlee More, MD      ? ? ?Chief Complaint:  Chest pain ? ?Patient Profile:  ? ?Elaine Roach is a 71 y.o. female with a hx of  mild CAD, diabetes, hypertension, hyperlipidemia, and orthostatic hypotension  last seen 04/03/2021. ? ?She went to see Dr. Bettina Gavia earlier today, and was having chest pain, symptoms concerning for unstable angina.  She was transferred to [facility] is being seen for the evaluation of chest pain and possible cardiac cath at the request of Dr. Bettina Gavia. ? ?History of Present Illness:  ? ?[name] had Coronary angiogram performed 04/27/2018, which showed normal left ventricular function EF greater than 65% she had mild CAD most severe stenosis was of the mid right coronary artery at 65%. ? ?She was seen in [facility] ED 09/30/2021 for what she calls heartburn which she describes as severe substernal pain rating through to her back unrelieved with nitroglycerin.  The symptoms were associated shortness of breath, nausea, dizziness, and feeling weak.   ? ?Her CBC was normal hemoglobin was 13, BMP was normal, creatinine was 0.6.  She had a initial and repeat troponin both undetectable.  Chest x-ray was read as no active disease.  She had CTA chest abdomen which showed no evidence of aortopathy and no pulmonary embolism.  She was noted to have coronary artery calcification and aortic atherosclerosis there is narrowing in the proximal celiac trunk noted and findings most consistent with history of cholecystectomy. ? ?For the last several weeks, she has been having problems with intermittent chest tightness.  She is not sure what brings it on or what makes it go away.  It reaches less than a 5/10. ? ?She has been having significant dyspnea on exertion.  She feels that although she manages to get housework  done she is not really able to do anything and cannot walk for exercise as she had wanted to do.  She is not sure if the chest tightness brings on shortness of breath, but she definitely has the shortness of breath without the chest tightness. ? ?She has been having nausea for a while.  After she had been having nausea for several days, she developed severe chest pain that is different from the tightness.  It reached a 10/10.  She felt like it was a Bazooka going through her chest to her back.  She took Gaviscon, but that did not help.  She took nitroglycerin x2, that did not help. ? ?She was not eating very much because of the pain and because she was nauseated all the time. ? ?Finally, the symptoms bothered her to the point that she went to [facility] on 09/30/2021.  After the nitroglycerin, she felt weak.  Her troponin was undetectable and her chest x-ray was normal.  A CTA of the chest and abdomen showed coronary artery calcifications and aortic atherosclerosis as well as narrowing in the proximal celiac trunk. ? ?When she went to see Dr. Bettina Gavia today, she was still having intermittent chest tightness and continuous nausea.  She has not really felt like eating today. ? ? ?Past Medical History:  ?Diagnosis Date  ? Arthritis   ? Carotid artery calcification   ? Complication of anesthesia   ? pt. woke up during hand surgery and colonoscopy, cataract surgery  ? Coronary  artery disease   ? Diabetes mellitus without complication ([place])   ? type 2    dx 10 yrs ago  ? Dysrhythmia   ? BENIGN PVC'S  ? GERD (gastroesophageal reflux disease)   ? Headache   ? History of OCD (obsessive compulsive disorder)   ? TO A DEGREE  ? Hypertension   ? Insomnia   ? Mixed hyperlipidemia   ? Nonalcoholic steatohepatitis ([facility])   ? DX IN 1995  ? Osteoarthritis   ? Pneumonia   ? walking pneu. in the past  ? PONV (postoperative nausea and vomiting)   ? ? ?Past Surgical History:  ?Procedure Laterality Date  ? ANTERIOR CERVICAL  DECOMP/DISCECTOMY FUSION  07/19/2017  ? Procedure: ANTERIOR CERVICAL DECOMPRESSION/DISCECTOMY FUSION , INTERBODY PROSTHESIS, ANTERIOR PLATE CERVICAL SIX- CERVICAL SEVEN, CERVICAL SEVEN- THORACIC ONE, EXPLORE OLD FUSION;  Surgeon: Newman Pies, MD;  Location: [facility];  Service: Neurosurgery;;  ? BACK SURGERY    ? x3  ? BUNIONECTOMY    ? RIGHT  ? CARDIAC CATHETERIZATION    ? CARPAL TUNNEL RELEASE    ? RIGHT  ? CHOLECYSTECTOMY    ? GANGLION CYST EXCISION Right   ? LEFT HEART CATH AND CORONARY ANGIOGRAPHY N/A 04/27/2018  ? Procedure: LEFT HEART CATH AND CORONARY ANGIOGRAPHY;  Surgeon: Leonie Man, MD;  Location: [facility];  Service: Cardiovascular;  Laterality: N/A;  ? MYELOGRAM    ? SHOULDER ARTHROSCOPY W/ ROTATOR CUFF REPAIR    ? 4 SCOPES.Marland KitchenMarland KitchenALL ON THE RIGHT  ? TUBAL LIGATION    ? WOUND EXPLORATION  03/19/2015  ? CERVICAL WOUND   ? WOUND EXPLORATION N/A 03/19/2015  ? Procedure: CERVICAL WOUND EXPLORATION, EVACUATION OF CERVICAL HEMATOMA;  Surgeon: Newman Pies, MD;  Location: [facility];  Service: Neurosurgery;  Laterality: N/A;  cervical wound exploration, evacuation of cervical hematoma  ?  ? ?Medications Prior to Admission: ?Prior to Admission medications   ?Medication Sig Start Date End Date Taking? Authorizing Provider  ?Ascorbic Acid (VITAMIN C PO) Take 1 tablet by mouth daily.   Yes [provider]  ?aspirin 81 MG EC tablet Take 81 mg by mouth daily.   Yes [provider]  ?Cholecalciferol (VITAMIN D3) 5000 units CAPS Take 5,000 Units by mouth daily.   Yes [provider]  ?cyclobenzaprine (FLEXERIL) 10 MG tablet Take 1 tablet (10 mg total) by mouth 3 (three) times daily as needed for muscle spasms. 05/30/21  Yes Viona Gilmore D, NP  ?diltiazem (CARDIZEM CD) 240 MG 24 hr capsule TAKE 1 CAPSULE BY MOUTH DAILY 12/03/20  Yes Richardo Priest, MD  ?docusate sodium (COLACE) 100 MG capsule Take 1 capsule (100 mg total) by mouth 2 (two) times daily. ?Patient taking  differently: Take 100 mg by mouth 2 (two) times daily as needed for mild constipation. 08/30/20  Yes Newman Pies, MD  ?ezetimibe (ZETIA) 10 MG tablet TAKE 1 TABLET(10 MG) BY MOUTH EVERY EVENING ?Patient taking differently: Take 10 mg by mouth every evening. 08/25/21  Yes Richardo Priest, MD  ?hydroxypropyl methylcellulose / hypromellose (ISOPTO TEARS / GONIOVISC) 2.5 % ophthalmic solution Place 1 drop into both eyes 3 (three) times daily as needed for dry eyes.   Yes [provider]  ?isosorbide mononitrate (IMDUR) 60 MG 24 hr tablet Take 1 tablet (60 mg total) by mouth daily. 08/28/20  Yes Richardo Priest, MD  ?losartan (COZAAR) 50 MG tablet Take 50 mg by mouth daily as needed (Blood Pressure is greater than 160  on top).   Yes [provider]  ?metFORMIN (GLUCOPHAGE) 500 MG tablet Take 500 mg by mouth 2 (two) times daily with a meal.   Yes [provider]  ?Multiple Vitamin (MULTIVITAMIN WITH MINERALS) TABS tablet Take 1 tablet by mouth daily.   Yes [provider]  ?nitroGLYCERIN (NITROSTAT) 0.4 MG SL tablet Place 0.4 mg under the tongue every 5 (five) minutes as needed for chest pain.   Yes [provider]  ?omeprazole (PRILOSEC) 40 MG capsule Take 40 mg by mouth every morning.    Yes [provider]  ?ranolazine (RANEXA) 500 MG 12 hr tablet Take 1 tablet (500 mg total) by mouth 2 (two) times daily. 11/29/20  Yes Richardo Priest, MD  ?sertraline (ZOLOFT) 100 MG tablet Take 100 mg by mouth daily.   Yes [provider]  ?vitamin B-12 (CYANOCOBALAMIN) 500 MCG tablet Take 500 mcg by mouth daily.   Yes [provider]  ?zolpidem (AMBIEN) 5 MG tablet Take 5 mg by mouth at bedtime. 09/15/21  Yes [provider]  ?glucose blood test strip 1 strip by Misc.(Non-Drug; Combo Route) route daily. 07/06/17   [provider]  ?oxyCODONE (OXY IR/ROXICODONE) 5 MG immediate release tablet Take 5 mg by mouth every 6 (six) hours as needed for  pain. ?Patient not taking: Reported on 10/03/2021 10/01/21   [provider]  ?sucralfate (CARAFATE) 1 g tablet Take 1 g by mouth at bedtime. ?Patient not taking: Reported on 10/03/2021 10/01/21   Provider, Historical

## 2021-10-03 NOTE — Patient Instructions (Signed)
[facility]  ?Tips to measure your blood pressure correctly ? ?To determine whether you have hypertension, a medical professional will take a blood pressure reading. How you prepare for the test, the position of your arm, and other factors can change a blood pressure reading by 10% or more. That could be enough to hide high blood pressure, start you on a drug you don't really need, or lead your doctor to incorrectly adjust your medications. ?National and international guidelines offer specific instructions for measuring blood pressure. If a doctor, nurse, or medical assistant isn't doing it right, don't hesitate to ask him or her to get with the guidelines. ?Here's what you can do to ensure a correct reading: ? Don't drink a caffeinated beverage or smoke during the 30 minutes before the test. ? Sit quietly for five minutes before the test begins. ? During the measurement, sit in a chair with your feet on the floor and your arm supported so your elbow is at about heart level. ? The inflatable part of the cuff should completely cover at least 80% of your upper arm, and the cuff should be placed on bare skin, not over a shirt. ? Don't talk during the measurement. ? Have your blood pressure measured twice, with a brief break in between. If the readings are different by 5 points or more, have it done a third time. ?There are times to break these rules. If you sometimes feel lightheaded when getting out of bed in the morning or when you stand after sitting, you should have your blood pressure checked while seated and then while standing to see if it falls from one position to the next. ?Because blood pressure varies throughout the day, your doctor will rarely diagnose hypertension on the basis of a single reading. Instead, he or she will want to confirm the measurements on at least two occasions, usually within a few weeks of one another. The exception to this rule is if you have a blood pressure reading of 180/110 mm Hg  or higher. A result this high usually calls for prompt treatment. ?It's also a good idea to have your blood pressure measured in both arms at least once, since the reading in one arm (usually the right) may be higher than that in the left. A 2014 study in The American Journal of Medicine of nearly 3,400 people found average arm- to-arm differences in systolic blood pressure of about 5 points. The higher number should be used to make treatment decisions. ?In 2017, new guidelines from the [place], the [employer] of Cardiology, and nine other health organizations lowered the diagnosis of high blood pressure to 130/80 mm Hg or higher for all adults. The guidelines also redefined the various blood pressure categories to now include normal, elevated, Stage 1 hypertension, Stage 2 hypertension, and hypertensive crisis (see "Blood pressure categories"). ?Blood pressure categories  ?Blood pressure category SYSTOLIC ?(upper number)  DIASTOLIC ?(lower number)  ?Normal Less than 120 mm Hg and Less than 80 mm Hg  ?Elevated 120-129 mm Hg and Less than 80 mm Hg  ?High blood pressure: Stage 1 hypertension 130-139 mm Hg or 80-89 mm Hg  ?High blood pressure: Stage 2 hypertension 140 mm Hg or higher or 90 mm Hg or higher  ?Hypertensive crisis (consult your doctor immediately) Higher than 180 mm Hg and/or Higher than 120 mm Hg  ?Source: American Heart Association and American Stroke Association. ?For more on getting your blood pressure under control, buy Controlling Your Blood  Pressure, a Special Health Report from [facility].  ?

## 2021-10-03 NOTE — ED Provider Notes (Signed)
Care transferred to me.  Patient has been seen by cardiology and recommended for discharge.  She feels well.  She understands to return if symptoms worsen or recur. ?  ?Sherwood Gambler, MD ?10/03/21 1836 ? ?

## 2021-10-03 NOTE — Consult Note (Addendum)
?Cardiology Consult:  ? ?Patient ID: Elaine Roach ?MRN: [id number]; DOB: Sep 22, 1950  ? ?Admission date: 10/03/2021 ? ?PCP:  Elaine Greenhouse, MD ?  ?[facility] Providers ?Cardiologist:  Elaine More, MD      ? ? ?Chief Complaint:  Chest pain ? ?Patient Profile:  ? ?Elaine Roach is a 71 y.o. female with a hx of  mild CAD, diabetes, hypertension, hyperlipidemia, and orthostatic hypotension  last seen 04/03/2021. ? ?She went to see Dr. Bettina Roach earlier today, and was having chest pain, symptoms concerning for unstable angina.  She was transferred to [facility] is being seen for the evaluation of chest pain and possible cardiac cath at the request of Dr. Bettina Roach. ? ?History of Present Illness:  ? ?Elaine Roach had Coronary angiogram performed 04/27/2018, which showed normal left ventricular function EF greater than 65% she had mild CAD most severe stenosis was of the mid right coronary artery at 65%. ? ?She was seen in [facility] ED 09/30/2021 for what she calls heartburn which she describes as severe substernal pain rating through to her back unrelieved with nitroglycerin.  The symptoms were associated shortness of breath, nausea, dizziness, and feeling weak.   ? ?Her CBC was normal hemoglobin was 13, BMP was normal, creatinine was 0.6.  She had a initial and repeat troponin both undetectable.  Chest x-ray was read as no active disease.  She had CTA chest abdomen which showed no evidence of aortopathy and no pulmonary embolism.  She was noted to have coronary artery calcification and aortic atherosclerosis there is narrowing in the proximal celiac trunk noted and findings most consistent with history of cholecystectomy. ? ?For the last several weeks, she has been having problems with intermittent chest tightness.  She is not sure what brings it on or what makes it go away.  It reaches less than a 5/10. ? ?She has been having significant dyspnea on exertion.  She feels that although she manages to get housework  done she is not really able to do anything and cannot walk for exercise as she had wanted to do.  She is not sure if the chest tightness brings on shortness of breath, but she definitely has the shortness of breath without the chest tightness. ? ?She has been having nausea for a while.  After she had been having nausea for several days, she developed severe chest pain that is different from the tightness.  It reached a 10/10.  She felt like it was a Elaine Roach going through her chest to her back.  She took Gaviscon, but that did not help.  She took nitroglycerin x2, that did not help. ? ?She was not eating very much because of the pain and because she was nauseated all the time. ? ?Finally, the symptoms bothered her to the point that she went to [facility] on 09/30/[facility]21.  After the nitroglycerin, she felt weak.  Her troponin was undetectable and her chest x-ray was normal.  A CTA of the chest and abdomen showed coronary artery calcifications and aortic atherosclerosis as well as narrowing in the proximal celiac trunk. ? ?When she went to see Dr. Bettina Roach today, she was still having intermittent chest tightness and continuous nausea.  She has not really felt like eating today. ? ? ?Past Medical History:  ?Diagnosis Date  ? Arthritis   ? Carotid artery calcification   ? Complication of anesthesia   ? pt. woke up during hand surgery and colonoscopy, cataract surgery  ? Coronary  artery disease   ? Diabetes mellitus without complication ([place])   ? type 2    dx 10 yrs ago  ? Dysrhythmia   ? BENIGN PVC'S  ? GERD (gastroesophageal reflux disease)   ? Headache   ? History of OCD (obsessive compulsive disorder)   ? TO A DEGREE  ? Hypertension   ? Insomnia   ? Mixed hyperlipidemia   ? Nonalcoholic steatohepatitis ([facility])   ? DX IN 1995  ? Osteoarthritis   ? Pneumonia   ? walking pneu. in the past  ? PONV (postoperative nausea and vomiting)   ? ? ?Past Surgical History:  ?Procedure Laterality Date  ? ANTERIOR CERVICAL  DECOMP/DISCECTOMY FUSION  07/19/2017  ? Procedure: ANTERIOR CERVICAL DECOMPRESSION/DISCECTOMY FUSION , INTERBODY PROSTHESIS, ANTERIOR PLATE CERVICAL SIX- CERVICAL SEVEN, CERVICAL SEVEN- THORACIC ONE, EXPLORE OLD FUSION;  Surgeon: Newman Pies, MD;  Location: [facility];  Service: Neurosurgery;;  ? BACK SURGERY    ? x3  ? BUNIONECTOMY    ? RIGHT  ? CARDIAC CATHETERIZATION    ? CARPAL TUNNEL RELEASE    ? RIGHT  ? CHOLECYSTECTOMY    ? GANGLION CYST EXCISION Right   ? LEFT HEART CATH AND CORONARY ANGIOGRAPHY N/A 04/27/2018  ? Procedure: LEFT HEART CATH AND CORONARY ANGIOGRAPHY;  Surgeon: Leonie Man, MD;  Location: [facility];  Service: Cardiovascular;  Laterality: N/A;  ? MYELOGRAM    ? SHOULDER ARTHROSCOPY W/ ROTATOR CUFF REPAIR    ? 4 SCOPES.Marland KitchenMarland KitchenALL ON THE RIGHT  ? TUBAL LIGATION    ? WOUND EXPLORATION  03/19/2015  ? CERVICAL WOUND   ? WOUND EXPLORATION N/A 03/19/2015  ? Procedure: CERVICAL WOUND EXPLORATION, EVACUATION OF CERVICAL HEMATOMA;  Surgeon: Newman Pies, MD;  Location: [facility];  Service: Neurosurgery;  Laterality: N/A;  cervical wound exploration, evacuation of cervical hematoma  ?  ? ?Medications Prior to Admission: ?Prior to Admission medications   ?Medication Sig Start Date End Date Taking? Authorizing Provider  ?Ascorbic Acid (VITAMIN C PO) Take 1 tablet by mouth daily.   Yes [provider]  ?aspirin 81 MG EC tablet Take 81 mg by mouth daily.   Yes [provider]  ?Cholecalciferol (VITAMIN D3) 5000 units CAPS Take 5,000 Units by mouth daily.   Yes [provider]  ?cyclobenzaprine (FLEXERIL) 10 MG tablet Take 1 tablet (10 mg total) by mouth 3 (three) times daily as needed for muscle spasms. 05/30/21  Yes Viona Gilmore D, NP  ?diltiazem (CARDIZEM CD) 240 MG 24 hr capsule TAKE 1 CAPSULE BY MOUTH DAILY 12/03/20  Yes Richardo Priest, MD  ?docusate sodium (COLACE) 100 MG capsule Take 1 capsule (100 mg total) by mouth 2 (two) times daily. ?Patient taking  differently: Take 100 mg by mouth 2 (two) times daily as needed for mild constipation. 08/30/20  Yes Newman Pies, MD  ?ezetimibe (ZETIA) 10 MG tablet TAKE 1 TABLET(10 MG) BY MOUTH EVERY EVENING ?Patient taking differently: Take 10 mg by mouth every evening. 08/25/21  Yes Richardo Priest, MD  ?hydroxypropyl methylcellulose / hypromellose (ISOPTO TEARS / GONIOVISC) 2.5 % ophthalmic solution Place 1 drop into both eyes 3 (three) times daily as needed for dry eyes.   Yes [provider]  ?isosorbide mononitrate (IMDUR) 60 MG 24 hr tablet Take 1 tablet (60 mg total) by mouth daily. 08/28/20  Yes Richardo Priest, MD  ?losartan (COZAAR) 50 MG tablet Take 50 mg by mouth daily as needed (Blood Pressure is greater than 160  on top).   Yes [provider]  ?metFORMIN (GLUCOPHAGE) 500 MG tablet Take 500 mg by mouth 2 (two) times daily with a meal.   Yes [provider]  ?Multiple Vitamin (MULTIVITAMIN WITH MINERALS) TABS tablet Take 1 tablet by mouth daily.   Yes [provider]  ?nitroGLYCERIN (NITROSTAT) 0.4 MG SL tablet Place 0.4 mg under the tongue every 5 (five) minutes as needed for chest pain.   Yes [provider]  ?omeprazole (PRILOSEC) 40 MG capsule Take 40 mg by mouth every morning.    Yes [provider]  ?ranolazine (RANEXA) 500 MG 12 hr tablet Take 1 tablet (500 mg total) by mouth 2 (two) times daily. 11/29/20  Yes Richardo Priest, MD  ?sertraline (ZOLOFT) 100 MG tablet Take 100 mg by mouth daily.   Yes [provider]  ?vitamin B-12 (CYANOCOBALAMIN) 500 MCG tablet Take 500 mcg by mouth daily.   Yes [provider]  ?zolpidem (AMBIEN) 5 MG tablet Take 5 mg by mouth at bedtime. 09/15/21  Yes [provider]  ?glucose blood test strip 1 strip by Misc.(Non-Drug; Combo Route) route daily. 07/06/17   [provider]  ?oxyCODONE (OXY IR/ROXICODONE) 5 MG immediate release tablet Take 5 mg by mouth every 6 (six) hours as needed for  pain. ?Patient not taking: Reported on 10/03/2021 10/01/21   [provider]  ?sucralfate (CARAFATE) 1 g tablet Take 1 g by mouth at bedtime. ?Patient not taking: Reported on 10/03/2021 10/01/21   Provider, Historical

## 2021-10-06 ENCOUNTER — Telehealth: Payer: Self-pay | Admitting: *Deleted

## 2021-10-06 NOTE — Telephone Encounter (Signed)
No answer, voicemail message. 

## 2021-10-06 NOTE — Telephone Encounter (Addendum)
Cardiac Catheterization scheduled at [facility] for: Tuesday October 07, 2021 12 Noon ?Arrival time and place: [place] Entrance A at: 9:30 AM-needs BMP/CBC ? ? ?No solid food after midnight prior to cath, clear liquids until 5 AM day of procedure. ? ?Medication instructions: ?-Hold: ? Metformin-day of procedure and 48 hours post procedure ?-Except hold medications usual morning medications can be taken with sips of water including aspirin 81 mg. ? ?Confirmed patient has responsible adult to drive home post procedure and be with patient first 24 hours after arriving home. ? ?Patient reports no new symptoms concerning for COVID-19/no exposure to COVID-19 in the past 10 days. ? ?Reviewed procedure instructions with patient. ?

## 2021-10-07 ENCOUNTER — Encounter (HOSPITAL_COMMUNITY): Payer: Self-pay | Admitting: Interventional Cardiology

## 2021-10-07 ENCOUNTER — Other Ambulatory Visit: Payer: Self-pay

## 2021-10-07 ENCOUNTER — Ambulatory Visit (HOSPITAL_COMMUNITY)
Admission: RE | Admit: 2021-10-07 | Discharge: 2021-10-07 | Disposition: A | Discharge status: Home / Self Care | Payer: PPO | Attending: Interventional Cardiology | Admitting: Interventional Cardiology

## 2021-10-07 ENCOUNTER — Encounter (HOSPITAL_COMMUNITY)
Admission: RE | Disposition: A | Discharge status: Home / Self Care | Payer: Self-pay | Source: Home / Self Care | Attending: Interventional Cardiology

## 2021-10-07 DIAGNOSIS — Z87891 Personal history of nicotine dependence: Secondary | ICD-10-CM | POA: Diagnosis not present

## 2021-10-07 DIAGNOSIS — E785 Hyperlipidemia, unspecified: Secondary | ICD-10-CM | POA: Insufficient documentation

## 2021-10-07 DIAGNOSIS — I10 Essential (primary) hypertension: Secondary | ICD-10-CM | POA: Diagnosis not present

## 2021-10-07 DIAGNOSIS — R110 Nausea: Secondary | ICD-10-CM | POA: Diagnosis not present

## 2021-10-07 DIAGNOSIS — I951 Orthostatic hypotension: Secondary | ICD-10-CM | POA: Diagnosis not present

## 2021-10-07 DIAGNOSIS — E119 Type 2 diabetes mellitus without complications: Secondary | ICD-10-CM | POA: Diagnosis not present

## 2021-10-07 DIAGNOSIS — R0609 Other forms of dyspnea: Secondary | ICD-10-CM | POA: Insufficient documentation

## 2021-10-07 DIAGNOSIS — I25118 Atherosclerotic heart disease of native coronary artery with other forms of angina pectoris: Secondary | ICD-10-CM | POA: Insufficient documentation

## 2021-10-07 HISTORY — PX: LEFT HEART CATH AND CORONARY ANGIOGRAPHY: CATH118249

## 2021-10-07 LAB — CBC
HCT: 40.1 % (ref 36.0–46.0)
Hemoglobin: 12.9 g/dL (ref 12.0–15.0)
MCH: 27.8 pg (ref 26.0–34.0)
MCHC: 32.2 g/dL (ref 30.0–36.0)
MCV: 86.4 fL (ref 80.0–100.0)
Platelets: 241 10*3/uL (ref 150–400)
RBC: 4.64 MIL/uL (ref 3.87–5.11)
RDW: 15.2 % (ref 11.5–15.5)
WBC: 4.9 10*3/uL (ref 4.0–10.5)
nRBC: 0 % (ref 0.0–0.2)

## 2021-10-07 LAB — BASIC METABOLIC PANEL
Anion gap: 10 (ref 5–15)
BUN: 15 mg/dL (ref 8–23)
CO2: 25 mmol/L (ref 22–32)
Calcium: 9.5 mg/dL (ref 8.9–10.3)
Chloride: 104 mmol/L (ref 98–111)
Creatinine, Ser: 0.81 mg/dL (ref 0.44–1.00)
GFR, Estimated: >60 mL/min (ref >60)
Glucose, Bld: 115 mg/dL — ABNORMAL HIGH (ref 70–99)
Potassium: 3.7 mmol/L (ref 3.5–5.1)
Sodium: 139 mmol/L (ref 135–145)

## 2021-10-07 LAB — GLUCOSE, CAPILLARY
Glucose-Capillary: 124 mg/dL — ABNORMAL HIGH (ref 70–99)
Glucose-Capillary: 104 mg/dL — ABNORMAL HIGH (ref 70–99)

## 2021-10-07 SURGERY — LEFT HEART CATH AND CORONARY ANGIOGRAPHY
Anesthesia: LOCAL

## 2021-10-07 MED ORDER — IOHEXOL 350 MG/ML SOLN
INTRAVENOUS | Status: DC | PRN
  Administered 2021-10-07: 60 mL

## 2021-10-07 MED ORDER — SODIUM CHLORIDE 0.9 % IV SOLN: 250.0000 mL | INTRAVENOUS | Status: DC | PRN

## 2021-10-07 MED ORDER — HEPARIN (PORCINE) IN NACL 1000-0.9 UT/500ML-% IV SOLN
INTRAVENOUS | Status: AC
  Filled 2021-10-07: qty 1000

## 2021-10-07 MED ORDER — MIDAZOLAM HCL 2 MG/2ML IJ SOLN
INTRAMUSCULAR | Status: AC
  Filled 2021-10-07: qty 2

## 2021-10-07 MED ORDER — ONDANSETRON HCL 4 MG/2ML IJ SOLN
4.0000 mg | Freq: Four times a day (QID) | INTRAMUSCULAR | Status: DC | PRN
Start: 2021-10-07 — End: 2021-10-07

## 2021-10-07 MED ORDER — SODIUM CHLORIDE 0.9% FLUSH: 3.0000 mL | Freq: Two times a day (BID) | INTRAVENOUS | Status: DC

## 2021-10-07 MED ORDER — LIDOCAINE HCL (PF) 1 % IJ SOLN
INTRAMUSCULAR | Status: AC
  Filled 2021-10-07: qty 30

## 2021-10-07 MED ORDER — VERAPAMIL HCL 2.5 MG/ML IV SOLN
INTRAVENOUS | Status: AC
  Filled 2021-10-07: qty 2

## 2021-10-07 MED ORDER — SODIUM CHLORIDE 0.9 % IV SOLN: INTRAVENOUS | Status: DC

## 2021-10-07 MED ORDER — ACETAMINOPHEN 325 MG PO TABS: 650.0000 mg | ORAL_TABLET | ORAL | Status: DC | PRN

## 2021-10-07 MED ORDER — VERAPAMIL HCL 2.5 MG/ML IV SOLN
INTRAVENOUS | Status: DC | PRN
  Administered 2021-10-07 (×2): 10 mL via INTRA_ARTERIAL

## 2021-10-07 MED ORDER — SODIUM CHLORIDE 0.9% FLUSH: 3.0000 mL | INTRAVENOUS | Status: DC | PRN

## 2021-10-07 MED ORDER — HEPARIN SODIUM (PORCINE) 1000 UNIT/ML IJ SOLN
INTRAMUSCULAR | Status: DC | PRN
  Administered 2021-10-07: 3500 [IU] via INTRAVENOUS

## 2021-10-07 MED ORDER — FENTANYL CITRATE (PF) 100 MCG/2ML IJ SOLN
INTRAMUSCULAR | Status: AC
  Filled 2021-10-07: qty 2

## 2021-10-07 MED ORDER — MIDAZOLAM HCL 2 MG/2ML IJ SOLN
INTRAMUSCULAR | Status: DC | PRN
  Administered 2021-10-07: 2 mg via INTRAVENOUS
  Administered 2021-10-07: 1 mg via INTRAVENOUS

## 2021-10-07 MED ORDER — LABETALOL HCL 5 MG/ML IV SOLN: 10.0000 mg | INTRAVENOUS | Status: DC | PRN

## 2021-10-07 MED ORDER — FENTANYL CITRATE (PF) 100 MCG/2ML IJ SOLN
INTRAMUSCULAR | Status: DC | PRN
  Administered 2021-10-07 (×2): 25 ug via INTRAVENOUS

## 2021-10-07 MED ORDER — HYDRALAZINE HCL 20 MG/ML IJ SOLN: 10.0000 mg | INTRAMUSCULAR | Status: DC | PRN

## 2021-10-07 MED ORDER — LIDOCAINE HCL (PF) 1 % IJ SOLN
INTRAMUSCULAR | Status: DC | PRN
  Administered 2021-10-07: 2 mL

## 2021-10-07 MED ORDER — HEPARIN (PORCINE) IN NACL 1000-0.9 UT/500ML-% IV SOLN
INTRAVENOUS | Status: DC | PRN
Start: 2021-10-07 — End: 2021-10-07
  Administered 2021-10-07 (×2): 500 mL

## 2021-10-07 MED ORDER — HEPARIN SODIUM (PORCINE) 1000 UNIT/ML IJ SOLN
INTRAMUSCULAR | Status: AC
  Filled 2021-10-07: qty 10

## 2021-10-07 MED ORDER — SODIUM CHLORIDE 0.9 % WEIGHT BASED INFUSION
3.0000 mL/kg/h | INTRAVENOUS | Status: AC
  Administered 2021-10-07: 3 mL/kg/h via INTRAVENOUS

## 2021-10-07 MED ORDER — METFORMIN HCL 500 MG PO TABS: 500.0000 mg | ORAL_TABLET | Freq: Two times a day (BID) | ORAL | Status: DC

## 2021-10-07 MED ORDER — SODIUM CHLORIDE 0.9 % WEIGHT BASED INFUSION: 1.0000 mL/kg/h | INTRAVENOUS | Status: DC

## 2021-10-07 MED ORDER — ASPIRIN 81 MG PO CHEW: 81.0000 mg | CHEWABLE_TABLET | ORAL | Status: DC

## 2021-10-07 SURGICAL SUPPLY — 9 items

## 2021-10-07 NOTE — Discharge Instructions (Signed)

## 2021-10-07 NOTE — Interval H&P Note (Signed)
Cath Lab Visit (complete for each Cath Lab visit) ? ?Clinical Evaluation Leading to the Procedure:  ? ?ACS: Yes.   ? ?Non-ACS:   ? ?Anginal Classification: CCS IV ? ?Anti-ischemic medical therapy: Minimal Therapy (1 class of medications) ? ?Non-Invasive Test Results: No non-invasive testing performed ? ?Prior CABG: No previous CABG ? ? ? ? ? ?History and Physical Interval Note: ? ?10/07/2021 ?11:06 AM ? ?Elaine Roach  has presented today for surgery, with the diagnosis of chest pain.  The various methods of treatment have been discussed with the patient and family. After consideration of risks, benefits and other options for treatment, the patient has consented to  Procedure(s): ?LEFT HEART CATH AND CORONARY ANGIOGRAPHY (N/A) as a surgical intervention.  The patient's history has been reviewed, patient examined, no change in status, stable for surgery.  I have reviewed the patient's chart and labs.  Questions were answered to the patient's satisfaction.   ? ? ?Larae Grooms ? ? ?

## 2021-10-24 ENCOUNTER — Other Ambulatory Visit: Payer: Self-pay | Admitting: Cardiology

## 2021-10-24 NOTE — Telephone Encounter (Signed)
Ranexa 500 mg # 180 tablets only with message patient needs appointment for future refills ?

## 2021-10-28 ENCOUNTER — Telehealth: Payer: Self-pay | Admitting: *Deleted

## 2021-10-28 NOTE — Telephone Encounter (Signed)
Per Dr Margaretha Glassing to see patient back routinely in 6 months. ?I left message on patient's voicemail, plan to see Dr Bettina Gavia in 6 months, recall placed.  ?

## 2021-11-18 ENCOUNTER — Telehealth: Payer: Self-pay

## 2021-11-18 NOTE — Telephone Encounter (Signed)
-----   Message from Katrine Coho, RN sent at 10/28/2021 11:12 AM EDT ----- Regarding: Follow up appointment Hi Dr Bettina Gavia,  Left Heart Cath 10/07/21, no follow up scheduled. Does she need to schedule a follow up appointment with you? If so, how soon?  Thanks, Webb Silversmith

## 2021-11-18 NOTE — Telephone Encounter (Signed)
Patient returned call

## 2021-11-18 NOTE — Telephone Encounter (Signed)
Attempted to call patient to schedule a follow up appointment with Dr. Bettina Gavia. Patient did not answer the phone. Left message for patient to call us back.

## 2021-11-24 ENCOUNTER — Other Ambulatory Visit: Payer: Self-pay | Admitting: Cardiology

## 2021-11-24 NOTE — Telephone Encounter (Signed)
Isosorbide Mononitrate 60 mg # 90 x 3 refills sent to  [facility]

## 2021-11-29 ENCOUNTER — Other Ambulatory Visit: Payer: Self-pay | Admitting: Cardiology

## 2021-12-01 NOTE — Telephone Encounter (Signed)
Rx refill sent to pharmacy. 

## 2021-12-09 NOTE — Telephone Encounter (Signed)
Left message for patient to call back  

## 2021-12-10 ENCOUNTER — Telehealth: Payer: Self-pay | Admitting: Cardiology

## 2021-12-10 NOTE — Telephone Encounter (Signed)
Follow Up:     Patient is returning Elaine Roach's call from yesterday.

## 2021-12-11 ENCOUNTER — Encounter: Payer: Self-pay | Admitting: Cardiology

## 2021-12-11 NOTE — Telephone Encounter (Signed)
Another message thread was opened and I have started responding to the patient on that thread. I will be closing out this thread.

## 2021-12-11 NOTE — Telephone Encounter (Signed)
Left message for patient to call back  

## 2021-12-12 NOTE — Telephone Encounter (Signed)
I have started communicating with this patient via MyChart. I will close out this thread and continue communicating with her via MyChart.

## 2022-01-21 ENCOUNTER — Other Ambulatory Visit: Payer: Self-pay | Admitting: Cardiology

## 2022-01-27 ENCOUNTER — Ambulatory Visit
Admission: RE | Admit: 2022-01-27 | Discharge: 2022-01-27 | Disposition: A | Discharge status: Home / Self Care | Payer: PPO | Source: Ambulatory Visit | Attending: Obstetrics and Gynecology | Admitting: Obstetrics and Gynecology

## 2022-01-27 DIAGNOSIS — Z13820 Encounter for screening for osteoporosis: Secondary | ICD-10-CM

## 2022-03-01 ENCOUNTER — Other Ambulatory Visit: Payer: Self-pay | Admitting: Cardiology

## 2022-03-25 ENCOUNTER — Encounter: Payer: Self-pay | Admitting: Cardiology

## 2022-03-25 ENCOUNTER — Ambulatory Visit: Payer: PPO | Attending: Cardiology | Admitting: Cardiology

## 2022-03-25 VITALS — BP 134/70 | HR 74 | Ht 63.0 in | Wt 153.0 lb

## 2022-03-25 DIAGNOSIS — Z789 Other specified health status: Secondary | ICD-10-CM | POA: Diagnosis not present

## 2022-03-25 DIAGNOSIS — E119 Type 2 diabetes mellitus without complications: Secondary | ICD-10-CM

## 2022-03-25 DIAGNOSIS — E782 Mixed hyperlipidemia: Secondary | ICD-10-CM | POA: Diagnosis not present

## 2022-03-25 DIAGNOSIS — I10 Essential (primary) hypertension: Secondary | ICD-10-CM | POA: Diagnosis not present

## 2022-03-25 DIAGNOSIS — I2510 Atherosclerotic heart disease of native coronary artery without angina pectoris: Secondary | ICD-10-CM

## 2022-03-25 MED ORDER — PRAVASTATIN SODIUM 20 MG PO TABS
20.0000 mg | ORAL_TABLET | Freq: Every evening | ORAL | 3 refills | Status: DC
Start: 2022-03-25 — End: 2023-04-16

## 2022-03-25 NOTE — Progress Notes (Signed)
Cardiology Office Note:    Date:  03/25/2022   ID:  Elaine Roach, DOB September 07, 1950, MRN [id number]  PCP:  Algis Greenhouse, MD  Cardiologist:  Shirlee More, MD    Referring MD: Algis Greenhouse, MD    ASSESSMENT:    1. Mixed hyperlipidemia   2. Statin intolerance   3. Mild CAD   4. Essential hypertension   5. Type 2 diabetes mellitus without complication, without long-term current use of insulin (HCC)    PLAN:    In order of problems listed above:  Her primary concern today is lipids and I told her I agree in a diabetic her LDL and non-HDL cholesterol are excessive in the setting of CAD we will try another statin low intensity if not tolerated likely PCSK9 inhibitor and continue Zetia. Stay able CAD on coronary angiography and doing much better symptomatically not having angina we will continue her aspirin calcium channel blocker or nitrate ranolazine intensify lipid-lowering therapy Stable hypertension continue her ARB with diabetes Stable diabetes managed by her PCP   Next appointment: 6 months    Medication Adjustments/Labs and Tests Ordered: Current medicines are reviewed at length with the patient today.  Concerns regarding medicines are outlined above.  Orders Placed This Encounter  Procedures   Lipid panel   Lipoprotein A (LPA)   Meds ordered this encounter  Medications   pravastatin (PRAVACHOL) 20 MG tablet    Sig: Take 1 tablet (20 mg total) by mouth every evening.    Dispense:  90 tablet    Refill:  3    Chief Complaint  Patient presents with   Follow-up  After coronary angiography  History of Present Illness:    Elaine Roach is a 71 y.o. female with a hx of mild CAD diabetes hypertension hyperlipidemia and orthostatic hypotension last seen 10/03/2021.Coronary angiogram performed 04/27/2018 showed normal left ventricular function EF greater than 65% she had mild CAD most severe stenosis of the mid right coronary artery 65%.   Compliance with  diet, lifestyle and medications: Yes  She is reassured by the results of her coronary angiogram and is not having any angina on current medical therapy including oral nitrates and ranolazine. Her concern is lipid profile 02/10/2022 cholesterol 198 LDL elevated at 114 not on statin non-HDL 141 and triglycerides 166. She was intolerant of Lipitor in the past We reviewed options.  She will continue Zetia and we will try a low intensity statin pravastatin with plan to recheck her labs in 2 months and if not at target we could either use bempedoic acid or PCSK9 inhibitor.  I do not think she requires additional therapy for triglycerides at this time. Her prior problem is worsened chronic back pain and is considering spinal cord stimulator As mentioned she has had no angina edema shortness of breath palpitation or syncope  Left heart catheterization 10/07/2021: LEFT HEART CATH AND CORONARY ANGIOGRAPHY   Conclusion   Ost LM to Prox LAD lesion is 5% stenosed.   Prox LAD to Mid LAD lesion is 40% stenosed.   Mid Cx lesion is 45% stenosed.   Ost Cx to Prox Cx lesion is 20% stenosed.   Mid RCA lesion is 50% stenosed.   The left ventricular systolic function is normal.   LV end diastolic pressure is normal.   The left ventricular ejection fraction is 55-65% by visual estimate.   Mild to moderate nonobstructive coronary artery disease. RCA disease appears less severe. Past Medical History:  Diagnosis  Date   Arthritis    Carotid artery calcification    Complication of anesthesia    pt. woke up during hand surgery and colonoscopy, cataract surgery   Coronary artery disease    Diabetes mellitus without complication ([place])    type 2    dx 10 yrs ago   Dysrhythmia    BENIGN PVC'S   GERD (gastroesophageal reflux disease)    Headache    History of OCD (obsessive compulsive disorder)    TO A DEGREE   Hypertension    Insomnia    Mixed hyperlipidemia    Nonalcoholic steatohepatitis ([facility])    DX IN  1995   Osteoarthritis    Pneumonia    walking pneu. in the past   PONV (postoperative nausea and vomiting)     Past Surgical History:  Procedure Laterality Date   ANTERIOR CERVICAL DECOMP/DISCECTOMY FUSION  07/19/2017   Procedure: ANTERIOR CERVICAL DECOMPRESSION/DISCECTOMY FUSION , INTERBODY PROSTHESIS, ANTERIOR PLATE CERVICAL SIX- CERVICAL SEVEN, CERVICAL SEVEN- THORACIC ONE, EXPLORE OLD FUSION;  Surgeon: Newman Pies, MD;  Location: [facility];  Service: Neurosurgery;;   BACK SURGERY     x3   BUNIONECTOMY     RIGHT   CARDIAC CATHETERIZATION     CARPAL TUNNEL RELEASE     RIGHT   CHOLECYSTECTOMY     GANGLION CYST EXCISION Right    LEFT HEART CATH AND CORONARY ANGIOGRAPHY N/A 04/27/2018   Procedure: LEFT HEART CATH AND CORONARY ANGIOGRAPHY;  Surgeon: Leonie Man, MD;  Location: [facility];  Service: Cardiovascular;  Laterality: N/A;   LEFT HEART CATH AND CORONARY ANGIOGRAPHY N/A 10/07/2021   Procedure: LEFT HEART CATH AND CORONARY ANGIOGRAPHY;  Surgeon: Jettie Booze, MD;  Location: [facility];  Service: Cardiovascular;  Laterality: N/A;   MYELOGRAM     SHOULDER ARTHROSCOPY W/ ROTATOR CUFF REPAIR     4 SCOPES.Marland KitchenMarland KitchenALL ON THE RIGHT   TUBAL LIGATION     WOUND EXPLORATION  03/19/2015   CERVICAL WOUND    WOUND EXPLORATION N/A 03/19/2015   Procedure: CERVICAL WOUND EXPLORATION, EVACUATION OF CERVICAL HEMATOMA;  Surgeon: Newman Pies, MD;  Location: [facility];  Service: Neurosurgery;  Laterality: N/A;  cervical wound exploration, evacuation of cervical hematoma    Current Medications: Current Meds  Medication Sig   aspirin 81 MG EC tablet Take 81 mg by mouth daily.   BIOTIN PO Take 1,000 mg by mouth daily.   Calcium Carb-Cholecalciferol (CALCIUM 500 + D PO) Take 1 tablet by mouth daily. 5000 units of D   cyclobenzaprine (FLEXERIL) 10 MG tablet Take 1 tablet (10 mg total) by mouth 3 (three) times daily as needed for muscle spasms.   diltiazem (CARDIZEM CD)  240 MG 24 hr capsule TAKE 1 CAPSULE BY MOUTH DAILY   docusate sodium (COLACE) 100 MG capsule Take 1 capsule (100 mg total) by mouth 2 (two) times daily. (Patient taking differently: Take 100 mg by mouth 2 (two) times daily as needed for mild constipation.)   ezetimibe (ZETIA) 10 MG tablet Take 1 tablet (10 mg total) by mouth daily.   HYDROcodone-acetaminophen (NORCO/VICODIN) 5-325 MG tablet Take 1 tablet by mouth every 4 (four) hours as needed for pain.   hydroxypropyl methylcellulose / hypromellose (ISOPTO TEARS / GONIOVISC) 2.5 % ophthalmic solution Place 1 drop into both eyes 3 (three) times daily as needed for dry eyes.   isosorbide mononitrate (IMDUR) 60 MG 24 hr tablet TAKE 1 TABLET(60 MG) BY MOUTH DAILY  losartan (COZAAR) 50 MG tablet Take 50 mg by mouth daily as needed (Blood Pressure is greater than 160 on top).   Magnesium 500 MG CAPS Take 1 capsule by mouth daily.   metFORMIN (GLUCOPHAGE) 500 MG tablet Take 1 tablet (500 mg total) by mouth 2 (two) times daily with a meal.   nitroGLYCERIN (NITROSTAT) 0.4 MG SL tablet Place 0.4 mg under the tongue every 5 (five) minutes as needed for chest pain.   omeprazole (PRILOSEC) 40 MG capsule Take 1 capsule (40 mg total) by mouth 2 (two) times daily before a meal.   pravastatin (PRAVACHOL) 20 MG tablet Take 1 tablet (20 mg total) by mouth every evening.   ranolazine (RANEXA) 500 MG 12 hr tablet Take 1 tablet (500 mg total) by mouth 2 (two) times daily.   sertraline (ZOLOFT) 100 MG tablet Take 100 mg by mouth daily.   vitamin B-12 (CYANOCOBALAMIN) 500 MCG tablet Take 500 mcg by mouth daily.   zolpidem (AMBIEN) 5 MG tablet Take 5 mg by mouth at bedtime.     Allergies:   Lisinopril, Atorvastatin, and Tape   Social History   Socioeconomic History   Marital status: Divorced    Spouse name: Not on file   Number of children: Not on file   Years of education: Not on file   Highest education level: Not on file  Occupational History   Not on file   Tobacco Use   Smoking status: Former    Packs/day: 2.00    Years: 17.00    Total pack years: 34.00    Types: Cigarettes   Smokeless tobacco: Never   Tobacco comments:    quit when she was 32  Vaping Use   Vaping Use: Never used  Substance and Sexual Activity   Alcohol use: Yes    Comment: OCCASIONAL  WINE   Drug use: No   Sexual activity: Not on file  Other Topics Concern   Not on file  Social History Narrative   Not on file   Social Determinants of Health   Financial Resource Strain: Not on file  Food Insecurity: Not on file  Transportation Needs: Not on file  Physical Activity: Not on file  Stress: Not on file  Social Connections: Not on file     Family History: The patient's family history includes Breast cancer in her maternal grandmother and mother; Heart Problems in her father; Heart failure in her paternal grandfather and paternal grandmother. ROS:   Please see the history of present illness.    All other systems reviewed and are negative.  EKGs/Labs/Other Studies Reviewed:    The following studies were reviewed today:  Recent Labs: 10/03/2021: ALT 50; B Natriuretic Peptide 24.3 10/07/2021: BUN 15; Creatinine, Ser 0.81; Hemoglobin 12.9; Platelets 241; Potassium 3.7; Sodium 139  Recent Lipid Panel See history  Physical Exam:    VS:  BP 134/70 (BP Location: Left Arm, Patient Position: Sitting, Cuff Size: Normal)   Pulse 74   Ht 5' 3"  (1.6 m)   Wt 153 lb (69.4 kg)   SpO2 94%   BMI 27.10 kg/m     Wt Readings from Last 3 Encounters:  03/25/22 153 lb (69.4 kg)  10/07/21 143 lb (64.9 kg)  10/03/21 143 lb (64.9 kg)     GEN:  Well nourished, well developed in no acute distress HEENT: Normal NECK: No JVD; No carotid bruits LYMPHATICS: No lymphadenopathy CARDIAC: RRR, no murmurs, rubs, gallops RESPIRATORY:  Clear to auscultation without rales, wheezing  or rhonchi  ABDOMEN: Soft, non-tender, non-distended MUSCULOSKELETAL:  No edema; No deformity   SKIN: Warm and dry NEUROLOGIC:  Alert and oriented x 3 PSYCHIATRIC:  Normal affect    Signed, Shirlee More, MD  03/25/2022 11:44 AM    [place]

## 2022-03-25 NOTE — Patient Instructions (Signed)
Medication Instructions:  Your physician has recommended you make the following change in your medication:  Start Pravastatin 20 mg once daily at night  *If you need a refill on your cardiac medications before your next appointment, please call your pharmacy*   Lab Work: Your physician recommends that you return for lab work in: 2 months after starting the pravastatin for a Fasting Lipid Profile and LPa Lab opens at [place] an appointment. Best time to come is between 8am and 12noon and between 1:30 and 4:30. If you have been asked to fast for your blood work please have nothing to eat or drink after midnight. You may have water.   If you have labs (blood work) drawn today and your tests are completely normal, you will receive your results only by: [place] (if you have MyChart) OR A paper copy in the mail If you have any lab test that is abnormal or we need to change your treatment, we will call you to review the results.   Testing/Procedures: NONE   Follow-Up: At [facility], you and your health needs are our priority.  As part of our continuing mission to provide you with exceptional heart care, we have created designated Provider Care Teams.  These Care Teams include your primary Cardiologist (physician) and Advanced Practice Providers (APPs -  Physician Assistants and Nurse Practitioners) who all work together to provide you with the care you need, when you need it.  We recommend signing up for the patient portal called "MyChart".  Sign up information is provided on this After Visit Summary.  MyChart is used to connect with patients for Virtual Visits (Telemedicine).  Patients are able to view lab/test results, encounter notes, upcoming appointments, etc.  Non-urgent messages can be sent to your provider as well.   To learn more about what you can do with MyChart, go to [email or web address].    Your next appointment:   9 month(s)  The format for  your next appointment:   In Person  Provider:   Shirlee More, MD    Other Instructions   Important Information About Sugar

## 2022-04-29 ENCOUNTER — Other Ambulatory Visit: Payer: Self-pay | Admitting: Cardiology

## 2022-05-21 LAB — LIPID PANEL
Chol/HDL Ratio: 3.2 ratio (ref 0.0–4.4)
Cholesterol, Total: 167 mg/dL (ref 100–199)
HDL: 52 mg/dL (ref >39)
LDL Chol Calc (NIH): 84 mg/dL (ref 0–99)
Triglycerides: 182 mg/dL — ABNORMAL HIGH (ref 0–149)
VLDL Cholesterol Cal: 31 mg/dL (ref 5–40)

## 2022-05-21 LAB — LIPOPROTEIN A (LPA): Lipoprotein (a): 18.6 nmol/L (ref <75.0)

## 2022-05-22 ENCOUNTER — Telehealth: Payer: Self-pay

## 2022-05-22 NOTE — Telephone Encounter (Signed)
Patient notified via mychart

## 2022-05-22 NOTE — Telephone Encounter (Signed)
-----   Message from Richardo Priest, MD sent at 05/22/2022  7:48 AM EST ----- Good result lipids please stay on pravastatin  The other test for genetic lipoprotein abnormality is normal

## 2022-05-31 ENCOUNTER — Other Ambulatory Visit: Payer: Self-pay | Admitting: Cardiology

## 2022-07-17 ENCOUNTER — Other Ambulatory Visit: Payer: Self-pay | Admitting: Obstetrics and Gynecology

## 2022-07-17 DIAGNOSIS — Z1231 Encounter for screening mammogram for malignant neoplasm of breast: Secondary | ICD-10-CM

## 2022-07-30 DIAGNOSIS — M546 Pain in thoracic spine: Secondary | ICD-10-CM | POA: Insufficient documentation

## 2022-09-03 ENCOUNTER — Other Ambulatory Visit: Payer: Self-pay | Admitting: Cardiology

## 2022-09-04 ENCOUNTER — Ambulatory Visit
Admission: RE | Admit: 2022-09-04 | Discharge: 2022-09-04 | Disposition: A | Discharge status: Home / Self Care | Payer: PPO | Source: Ambulatory Visit | Attending: Obstetrics and Gynecology | Admitting: Obstetrics and Gynecology

## 2022-09-04 DIAGNOSIS — Z1231 Encounter for screening mammogram for malignant neoplasm of breast: Secondary | ICD-10-CM

## 2023-01-16 ENCOUNTER — Other Ambulatory Visit: Payer: Self-pay | Admitting: Cardiology

## 2023-01-18 ENCOUNTER — Telehealth: Payer: Self-pay | Admitting: Cardiology

## 2023-01-18 NOTE — Telephone Encounter (Signed)
Pt c/o medication issue:  1. Name of Medication: ezetimibe (ZETIA) 10 MG tablet   2. How are you currently taking this medication (dosage and times per day)? Take 1 tablet (10 mg total) by mouth daily.   3. Are you having a reaction (difficulty breathing--STAT)? No  4. What is your medication issue? Pt states that due to lab results her PCP recommended that she stops medication. She would like a callback regarding this matter. Please advise

## 2023-01-20 ENCOUNTER — Other Ambulatory Visit: Payer: Self-pay

## 2023-01-20 MED ORDER — NITROGLYCERIN 0.4 MG SL SUBL
0.4000 mg | SUBLINGUAL_TABLET | SUBLINGUAL | 1 refills | Status: DC | PRN
Start: 2023-01-20 — End: 2024-05-01

## 2023-01-20 NOTE — Telephone Encounter (Signed)
Called patient and she reported that her PCP had taken her off of Zetia due to her lab results and started her on Rosuvastatin. Patient would like to verify that it is ok for her not to take Zetia. Please advise

## 2023-01-20 NOTE — Telephone Encounter (Signed)
Patient stated she was taken off this medication by her PCP and wants a call back to confirm this is OK with Dr. Dulce Sellar.  Patient stated she has been off the medication since Friday.

## 2023-01-22 NOTE — Telephone Encounter (Signed)
Called patient and informed her of Dr. Vanetta Shawl recommendation below:  "That is perfectly, if she can take her rosuvastatin that even better"   Patient verbalized understanding and had no further questions at this time.

## 2023-02-16 ENCOUNTER — Telehealth: Payer: Self-pay | Admitting: Cardiology

## 2023-02-16 MED ORDER — RANOLAZINE ER 500 MG PO TB12: 500.0000 mg | ORAL_TABLET | Freq: Two times a day (BID) | ORAL | 1 refills | Status: DC

## 2023-02-16 NOTE — Telephone Encounter (Signed)
*  STAT* If patient is at the pharmacy, call can be transferred to refill team.   1. Which medications need to be refilled? (please list name of each medication and dose if known) ranolazine (RANEXA) 500 MG 12 hr tablet  2. Which pharmacy/location (including street and city if local pharmacy) is medication to be sent to? [facility] [phone] - [place], [place] - [address]  3. Do they need a 30 day or 90 day supply?   90 day supply  Patient is requesting to have her refill increased from a 30 day supply to a 90 day supply.

## 2023-02-21 ENCOUNTER — Other Ambulatory Visit: Payer: Self-pay | Admitting: Cardiology

## 2023-03-02 ENCOUNTER — Other Ambulatory Visit: Payer: Self-pay | Admitting: Cardiology

## 2023-04-16 ENCOUNTER — Encounter: Payer: Self-pay | Admitting: Cardiology

## 2023-04-16 ENCOUNTER — Ambulatory Visit: Payer: PPO | Attending: Cardiology | Admitting: Cardiology

## 2023-04-16 VITALS — BP 130/62 | HR 64 | Ht 63.0 in | Wt 161.6 lb

## 2023-04-16 DIAGNOSIS — I10 Essential (primary) hypertension: Secondary | ICD-10-CM | POA: Diagnosis not present

## 2023-04-16 DIAGNOSIS — E782 Mixed hyperlipidemia: Secondary | ICD-10-CM

## 2023-04-16 DIAGNOSIS — E119 Type 2 diabetes mellitus without complications: Secondary | ICD-10-CM | POA: Diagnosis not present

## 2023-04-16 DIAGNOSIS — I951 Orthostatic hypotension: Secondary | ICD-10-CM

## 2023-04-16 DIAGNOSIS — I2510 Atherosclerotic heart disease of native coronary artery without angina pectoris: Secondary | ICD-10-CM | POA: Diagnosis not present

## 2023-04-16 DIAGNOSIS — Z789 Other specified health status: Secondary | ICD-10-CM

## 2023-04-16 NOTE — Patient Instructions (Signed)

## 2023-04-20 ENCOUNTER — Telehealth: Payer: Self-pay | Admitting: Cardiology

## 2023-04-20 ENCOUNTER — Other Ambulatory Visit: Payer: Self-pay

## 2023-04-20 NOTE — Telephone Encounter (Signed)
Called patient and informed her that Dr. Dulce Sellar agreed with Dr. Sol Passer and wanted her to discontinue her Zetia. Patient verbalized understanding and had no further questions at this time.

## 2023-04-20 NOTE — Telephone Encounter (Signed)
Called patient and she reported that during her last office visit she had said that Dr. Sol Passer had taken her off of the isosorbide. She calls back today and reports that she is still taking the isosorbide and it was Zetia that Dr. Sol Passer had discontinued. Did you want her to continue the Zetia or is it ok to discontinue it from our medication list?

## 2023-04-20 NOTE — Telephone Encounter (Signed)
Pt c/o medication issue:  1. Name of Medication:   ezetimibe (ZETIA) 10 MG tablet    2. How are you currently taking this medication (dosage and times per day)?   3. Are you having a reaction (difficulty breathing--STAT)? No  4. What is your medication issue? Pt states she is no longer taking this medication but she is still taking Isosorbide. Please advise

## 2023-04-27 ENCOUNTER — Telehealth: Payer: Self-pay | Admitting: Cardiology

## 2023-04-27 MED ORDER — RANOLAZINE ER 500 MG PO TB12: 500.0000 mg | ORAL_TABLET | Freq: Two times a day (BID) | ORAL | 3 refills | Status: DC

## 2023-04-27 NOTE — Telephone Encounter (Signed)
Refill of Ranexa 50 mg sent to [facility].

## 2023-04-27 NOTE — Telephone Encounter (Signed)
*  STAT* If patient is at the pharmacy, call can be transferred to refill team.   1. Which medications need to be refilled? (please list name of each medication and dose if known)  Ranolazine   2. Would you like to learn more about the convenience, safety, & potential cost savings by using the [facility]?     3. Are you open to using the [facility] (Type [facility]. .   4. Which pharmacy/location (including street and city if local pharmacy) is medication to be sent to?[facility] [place]   5. Do they need a 30 day or 90 day supply?  90 days # 180 and refills

## 2023-05-26 ENCOUNTER — Telehealth: Payer: Self-pay | Admitting: Cardiology

## 2023-05-26 ENCOUNTER — Other Ambulatory Visit: Payer: Self-pay

## 2023-05-26 MED ORDER — DILTIAZEM HCL ER COATED BEADS 240 MG PO CP24: 240.0000 mg | ORAL_CAPSULE | Freq: Every day | ORAL | 3 refills | Status: DC

## 2023-05-26 NOTE — Telephone Encounter (Signed)
*  STAT* If patient is at the pharmacy, call can be transferred to refill team.   1. Which medications need to be refilled? (please list name of each medication and dose if known) diltiazem (CARTIA XT) 240 MG 24 hr capsule    2. Would you like to learn more about the convenience, safety, & potential cost savings by using the [facility]?     3. Are you open to using the [facility] ([facility]. ).   4. Which pharmacy/location (including street and city if local pharmacy) is medication to be sent to? [facility] [phone] - [place], [place] - [address] AT [address] OF [address]    5. Do they need a 30 day or 90 day supply? 90

## 2023-07-09 ENCOUNTER — Other Ambulatory Visit: Payer: Self-pay | Admitting: Cardiology

## 2023-08-10 ENCOUNTER — Other Ambulatory Visit: Payer: Self-pay

## 2023-08-10 ENCOUNTER — Telehealth: Payer: Self-pay | Admitting: Cardiology

## 2023-08-10 NOTE — Telephone Encounter (Signed)
 Patient called the office stating that she should still be taking Imdur. After researching the chart I did not see where it had been discontinued. Do you want her to still be taking Imdur 60 mg daily?

## 2023-08-10 NOTE — Telephone Encounter (Signed)
 Pt c/o medication issue:  1. Name of Medication:    2. How are you currently taking this medication (dosage and times per day)?   3. Are you having a reaction (difficulty breathing--STAT)? no  4. What is your medication issue? Patient calling to see why medication is not listed for current medication. Please advise

## 2023-08-11 ENCOUNTER — Other Ambulatory Visit: Payer: Self-pay

## 2023-08-11 MED ORDER — ISOSORBIDE MONONITRATE ER 60 MG PO TB24: 60.0000 mg | ORAL_TABLET | Freq: Every day | ORAL | 3 refills | Status: DC

## 2023-08-11 NOTE — Telephone Encounter (Signed)
 Called the patient to inform her regarding her Imdur medication recommendation from Dr. Dulce Sellar below:  "Yes restart"   Patient did not answer the phone and a detailed message was left explaining that her Imdur medication had been ordered and sent to her pharmacy. I also explained that if she had any questions regarding this to contact the office.

## 2023-08-26 ENCOUNTER — Other Ambulatory Visit: Payer: Self-pay | Admitting: Family Medicine

## 2023-08-26 DIAGNOSIS — Z1231 Encounter for screening mammogram for malignant neoplasm of breast: Secondary | ICD-10-CM

## 2023-09-09 ENCOUNTER — Telehealth: Payer: Self-pay | Admitting: Cardiology

## 2023-09-09 NOTE — Telephone Encounter (Signed)
 Pt calling to request that a copy of her medication list be sent to PCP. Please advise

## 2023-09-09 NOTE — Telephone Encounter (Signed)
 Called the patient and informed her that her medication list had been faxed to her PCP's office. Patient was appreciative for the call and had no further questions at this time.

## 2023-09-16 ENCOUNTER — Ambulatory Visit

## 2023-09-30 ENCOUNTER — Ambulatory Visit
Admission: RE | Admit: 2023-09-30 | Discharge: 2023-09-30 | Disposition: A | Discharge status: Home / Self Care | Source: Ambulatory Visit | Attending: Family Medicine | Admitting: Family Medicine

## 2023-09-30 DIAGNOSIS — Z1231 Encounter for screening mammogram for malignant neoplasm of breast: Secondary | ICD-10-CM

## 2024-02-05 ENCOUNTER — Other Ambulatory Visit: Payer: Self-pay | Admitting: Cardiology

## 2024-02-27 ENCOUNTER — Other Ambulatory Visit: Payer: Self-pay | Admitting: Cardiology

## 2024-03-01 DIAGNOSIS — E6609 Other obesity due to excess calories: Secondary | ICD-10-CM | POA: Insufficient documentation

## 2024-03-03 DIAGNOSIS — Z0000 Encounter for general adult medical examination without abnormal findings: Secondary | ICD-10-CM | POA: Insufficient documentation

## 2024-04-27 ENCOUNTER — Other Ambulatory Visit: Payer: Self-pay

## 2024-04-27 DIAGNOSIS — T8859XA Other complications of anesthesia, initial encounter: Secondary | ICD-10-CM | POA: Insufficient documentation

## 2024-04-30 NOTE — Progress Notes (Addendum)
 Cardiology Office Note:    Date:  05/01/2024   ID:  Elaine Roach, DOB [birth year], MRN [id number]  PCP:  Elaine Lamar CROME, MD  Cardiologist:  Elaine Leiter, MD    Referring MD: Elaine Lamar CROME, MD    ASSESSMENT:    1. Mild CAD   2. Essential hypertension   3. Mixed hyperlipidemia   4. Statin intolerance   5. Pounding heartbeat    PLAN:    In order of problems listed above:  Is a unusual symptom complex of nocturnal palpitation and positional shortness of breath The differential diagnosis includes primary arrhythmia like atrial fibrillation versus atypical presentation of heart failure Further evaluation with an event monitor and an echocardiogram CAD  is stable on good medical therapy including aspirin  rate limiting calcium  channel blocker oral nitrate ranolazine  and lipid-lowering with rosuvastatin. Good result continue her current statin LLT.   Next appointment: 6 months   Medication Adjustments/Labs and Tests Ordered: Current medicines are reviewed at length with the patient today.  Concerns regarding medicines are outlined above.  Orders Placed This Encounter  Procedures   LONG TERM MONITOR (3-14 DAYS)   EKG 12-Lead   ECHOCARDIOGRAM COMPLETE   Meds ordered this encounter  Medications   nitroGLYCERIN  (NITROSTAT ) 0.4 MG SL tablet    Sig: Place 1 tablet (0.4 mg total) under the tongue every 5 (five) minutes as needed for chest pain.    Dispense:  25 tablet    Refill:  1     History of Present Illness:    Elaine Roach is a 73 y.o. female with a hx of mild CAD with ranolazine  therapy type 2 diabetes hypertension hyperlipidemia with statin intolerance and orthostatic hypotension last seen 04/16/2023.  Recent labs elevated albumin  to creatinine ratio 42 lipid profile cholesterol 140 LDL 62 non-HDL cholesterol 88 both done in September 2025 Compliance with diet, lifestyle and medications: Yes  She has an unusual symptom complex of nighttime palpitation quite  bothersome associated with shortness of breath on her back left side alleviated sleeping to the right. She does not have orthopnea or edema she is not short of breath during the day when she walks a dog. Not had anginal discomfort has not needed nitroglycerin . Overall she remains limited by her back disease and surgeries  Past Medical History:  Diagnosis Date   Arthritis    Carotid artery calcification    Complication of anesthesia    pt. woke up during hand surgery and colonoscopy, cataract surgery   Coronary artery disease    Diabetes mellitus without complication (HCC)    type 2    dx 10 yrs ago   Dysrhythmia    BENIGN PVC'S   GERD (gastroesophageal reflux disease)    Headache    History of OCD (obsessive compulsive disorder)    TO A DEGREE   Hypertension    Insomnia    Mixed hyperlipidemia    Nonalcoholic steatohepatitis (NASH)    DX IN 1995   Osteoarthritis    Pneumonia    walking pneu. in the past   PONV (postoperative nausea and vomiting)     Current Medications: Current Meds  Medication Sig   aspirin  81 MG EC tablet Take 81 mg by mouth daily.   BIOTIN PO Take 1,000 mg by mouth daily.   Blood Glucose Monitoring Suppl (ONE TOUCH ULTRA 2) w/Device KIT daily as needed.   Calcium  Carb-Cholecalciferol  (CALCIUM  500 + D PO) Take 1 tablet by mouth daily. 5000 units of  D   cyclobenzaprine  (FLEXERIL ) 10 MG tablet Take 1 tablet (10 mg total) by mouth 3 (three) times daily as needed for muscle spasms.   diltiazem  (CARDIZEM  CD) 240 MG 24 hr capsule TAKE 1 CAPSULE(240 MG) BY MOUTH DAILY   docusate sodium  (COLACE) 100 MG capsule Take 1 capsule (100 mg total) by mouth 2 (two) times daily. (Patient taking differently: Take 100 mg by mouth 2 (two) times daily as needed for mild constipation.)   HYDROcodone -acetaminophen  (NORCO/VICODIN) 5-325 MG tablet Take 1 tablet by mouth every 4 (four) hours as needed for pain.   hydroxypropyl methylcellulose / hypromellose (ISOPTO TEARS /  GONIOVISC) 2.5 % ophthalmic solution Place 1 drop into both eyes 3 (three) times daily as needed for dry eyes.   isosorbide  mononitrate (IMDUR ) 60 MG 24 hr tablet Take 1 tablet (60 mg total) by mouth daily.   losartan  (COZAAR ) 50 MG tablet Take 50 mg by mouth daily as needed (Blood Pressure is greater than 160 on top).   Magnesium  500 MG CAPS Take 1 capsule by mouth daily.   omeprazole  (PRILOSEC) 40 MG capsule Take 1 capsule (40 mg total) by mouth 2 (two) times daily before a meal.   ONETOUCH ULTRA test strip 1 each by Other route as needed.   OneTouch UltraSoft 2 Lancets MISC daily.   ranolazine  (RANEXA ) 500 MG 12 hr tablet TAKE ONE TABLET BY MOUTH TWICE A DAY   rosuvastatin (CRESTOR) 40 MG tablet Take 40 mg by mouth daily.   sertraline  (ZOLOFT ) 100 MG tablet Take 100 mg by mouth daily.   vitamin B-12 (CYANOCOBALAMIN) 500 MCG tablet Take 500 mcg by mouth daily.   zolpidem  (AMBIEN ) 10 MG tablet Take 10 mg by mouth at bedtime as needed.   [DISCONTINUED] nitroGLYCERIN  (NITROSTAT ) 0.4 MG SL tablet Place 1 tablet (0.4 mg total) under the tongue every 5 (five) minutes as needed for chest pain.      EKGs/Labs/Other Studies Reviewed:    The following studies were reviewed today:  Cardiac Studies & Procedures   ______________________________________________________________________________________________ CARDIAC CATHETERIZATION  CARDIAC CATHETERIZATION 10/07/2021  Conclusion   Ost LM to Prox LAD lesion is 5% stenosed.   Prox LAD to Mid LAD lesion is 40% stenosed.   Mid Cx lesion is 45% stenosed.   Ost Cx to Prox Cx lesion is 20% stenosed.   Mid RCA lesion is 50% stenosed.   The left ventricular systolic function is normal.   LV end diastolic pressure is normal.   The left ventricular ejection fraction is 55-65% by visual estimate.  Mild to moderate nonobstructive coronary artery disease. RCA disease appears less severe.  Continue medical therapy.  Findings Coronary  Findings Diagnostic  Dominance: Co-dominant  Left Main Vessel is large. Ost LM to Prox LAD lesion is 5% stenosed. The lesion is mildly calcified.  Left Anterior Descending Vessel is normal in caliber. By the mid vessel, the LAD tapers to a very small caliber vessel in the distal artery and does not not quite reach the apex. Prox LAD to Mid LAD lesion is 40% stenosed. The lesion is eccentric. The lesion is moderately calcified.  First Diagonal Branch Moderate-large  First Septal Branch Vessel is small in size.  Second Diagonal Branch Vessel is small in size.  Left Circumflex Vessel is large. There is mild diffuse disease throughout the vessel. Ost Cx to Prox Cx lesion is 20% stenosed. Mid Cx lesion is 45% stenosed. The lesion is irregular.  Lateral First Obtuse Marginal Branch Vessel is small  in size.  Right Coronary Artery Vessel is small. There is moderate diffuse disease throughout the vessel. Mid RCA lesion is 50% stenosed. The lesion is eccentric and irregular.  Right Posterior Descending Artery Vessel is small in size.  Intervention  No interventions have been documented.   CARDIAC CATHETERIZATION  CARDIAC CATHETERIZATION 04/27/2018  Conclusion Images from the original result were not included.   There is hyperdynamic left ventricular systolic function.  LV end diastolic pressure is normal.  The left ventricular ejection fraction is greater than 65% by visual estimate.  There is no aortic valve stenosis.  Mid Cx lesion is 45% stenosed.  Ost Cx to Prox Cx lesion is 20% stenosed.  Prox LAD to Mid LAD lesion is 40% stenosed.  Mid RCA lesion is 65% stenosed.  Ost LM to Prox LAD lesion is 5% stenosed.  SUMMARY  Mild-Moderate diffuse CAD consistent with diabetic coronary arteries.  Most significant lesion being a 70% eccentric lesion in the relatively small caliber codominant RCA (recommend medical management, not positive by CT FFR)  Suspect  abnormal coronary CTA FFR result was related to diffuse mild disease as opposed to any specific significant lesion.  Normal LVEDP with hyperdynamic left ventricle.  RECOMMENDATION  Optimize medical management -consider adding long-acting nitrate versus Ranexa .  Aggressive cardiovascular risk modification.  Recommend Aspirin  81mg  daily for moderate CAD.     Alm Clay, M.D., M.S. Interventional Cardiologist  Pager # [phone] Phone # [phone] [address] [place], [place] [address]  Findings Coronary Findings Diagnostic  Dominance: Co-dominant  Left Main Vessel is large. Ost LM to Prox LAD lesion is 5% stenosed. The lesion is mildly calcified.  Left Anterior Descending Vessel is normal in caliber. By the mid vessel, the LAD tapers to a very small caliber vessel in the distal artery and does not not quite reach the apex. Prox LAD to Mid LAD lesion is 40% stenosed. The lesion is eccentric. The lesion is moderately calcified.  First Diagonal Branch Moderate-large  First Septal Branch Vessel is small in size.  Second Diagonal Branch Vessel is small in size.  Left Circumflex Vessel is large. There is mild diffuse disease throughout the vessel. Ost Cx to Prox Cx lesion is 20% stenosed. Mid Cx lesion is 45% stenosed. The lesion is irregular.  Lateral First Obtuse Marginal Branch Vessel is small in size.  Right Coronary Artery Vessel is small. There is moderate diffuse disease throughout the vessel. Mid RCA lesion is 65% stenosed. The lesion is eccentric and irregular. Questionable PCI target given very small caliber downstream vessel.  Also not positive by CT FFR.  Right Posterior Descending Artery Vessel is small in size.  Intervention  No interventions have been documented.        MONITORS  LONG TERM MONITOR (3-14 DAYS) 01/12/2020  Narrative ZIO monitor was performed for 4 days beginning 01/11/2020 in order to assess  palpitation.  The cardiac rhythm throughout was sinus with minimum average and maximum heart rates of 60, 78 and 126 bpm.  There were no pauses of 3 seconds or greater and no episodes of second or third-degree AV nodal block or sinus node exit block.  There were 6 triggered events 1 was associated with rare APC 2 were associated with rare PVCs.  Ventricular ectopy was rare with isolated PVCs  Supraventricular ectopy is rare without episodes of atrial fibrillation or flutter.   Conclusion unremarkable event monitor however 3 the triggered events were associated with isolated PVCs or APCs.  CT SCANS  CT CORONARY FRACTIONAL FLOW RESERVE DATA PREP 04/21/2018  Narrative CLINICAL DATA:  Chest pain  EXAM: CT FFR  MEDICATIONS: No additional medications.  TECHNIQUE: The coronary CTA was sent for FFR.  FINDINGS: FFR 0.83 distal RCA  FFR 0.84 distal LAD  FFR 0.63 distal LCx.  IMPRESSION: 1. Stenosis in mid-distal LCx appears to be hemodynamically significant.  2. Disease in RCA and LAD does not appear to be hemodynamically significant.  Dalton Mclean   Electronically Signed By: Ezra Shuck M.D. On: 04/22/2018 15:30   CT CORONARY MORPH W/CTA COR W/SCORE 04/21/2018  Addendum 04/21/2018  2:12 PM ADDENDUM REPORT: 04/21/2018 14:09  CLINICAL DATA:  Chest pain  EXAM: Cardiac CTA  MEDICATIONS: Sub lingual nitro. 4mg  x 2  TECHNIQUE: The patient was scanned on a Siemens 192 slice scanner. Gantry rotation speed was 250 msecs. Collimation was 0.6 mm. A 100 kV prospective scan was triggered in the ascending thoracic aorta at 35-75% of the R-R interval. Average HR during the scan was 60 bpm. The 3D data set was interpreted on a dedicated work station using MPR, MIP and VRT modes. A total of 80cc of contrast was used.  FINDINGS: Non-cardiac: See separate report from [facility].  Pulmonary veins drained normally to the left atrium.  Calcium  Score: 598  Agatston units.  Coronary Arteries: Codominant with no anomalies  LM: Mixed plaque distal left main, mild (<50%) stenosis.  LAD system: Extensive calcified plaque proximal and mid LAD, suspect mild (<50%) stenosis.  Circumflex system: Mixed plaque proximal and mid LCx. Mild stenosis (<50%) proximal LCx.  RCA system: Relatively smalll RCA. Calcified plaque proximally, suspect mild (< 50%) stenosis.  IMPRESSION: 1. Coronary artery calcium  score 598 Agatston units. This places the patient in the 96th percentile for age and gender, suggesting high risk for future cardiac events.  2. Extensive plaque though all areas look to be < 50% stenosis. Will send for FFR to confirm.  Dalton Mclean   Electronically Signed By: Ezra Shuck M.D. On: 04/21/2018 14:09  Narrative EXAM: OVER-READ INTERPRETATION  CT CHEST  The following report is an over-read performed by radiologist Dr. Marcey Moan of [facility] on 04/21/2018. This over-read does not include interpretation of cardiac or coronary anatomy or pathology. The coronary CTA interpretation by the cardiologist is attached.  COMPARISON:  None.  FINDINGS: Vascular: No incidental findings.  Mediastinum/Nodes: Visualized mediastinum and hilar regions show no evidence of lymphadenopathy or masses.  Lungs/Pleura: Visualized lungs show no evidence of pulmonary edema, consolidation, pneumothorax, nodule or pleural fluid.  Upper Abdomen: No acute abnormality.  Musculoskeletal: No chest wall mass or suspicious bone lesions identified.  IMPRESSION: No significant incidental findings.  Electronically Signed: By: Marcey Moan M.D. On: 04/21/2018 13:18     ______________________________________________________________________________________________      EKG Interpretation Date/Time:  Monday May 01 2024 10:21:16 EST Ventricular Rate:  74 PR Interval:  162 QRS Duration:  94 QT Interval:  402 QTC  Calculation: 446 R Axis:   18  Text Interpretation: Normal sinus rhythm Incomplete right bundle branch block When compared with ECG of 16-Apr-2023 11:08, No significant change was found Confirmed by Monetta Rogue ([id number]) on 05/01/2024 10:33:42 AM   Recent Labs: No results found for requested labs within last 365 days.  Recent Lipid Panel    Component Value Date/Time   CHOL 167 05/20/2022 1059   TRIG 182 (H) 05/20/2022 1059   HDL 52 05/20/2022 1059   CHOLHDL 3.2 05/20/2022 1059  LDLCALC 84 05/20/2022 1059    Physical Exam:    VS:  BP 130/70   Pulse 74   Ht 5' 3 (1.6 m)   Wt 168 lb 9.6 oz (76.5 kg)   SpO2 97%   BMI 29.87 kg/m     Wt Readings from Last 3 Encounters:  05/01/24 168 lb 9.6 oz (76.5 kg)  04/16/23 161 lb 9.6 oz (73.3 kg)  03/25/22 153 lb (69.4 kg)     GEN:  Well nourished, well developed in no acute distress HEENT: Normal NECK: No JVD; No carotid bruits LYMPHATICS: No lymphadenopathy CARDIAC: RRR, no murmurs, rubs, gallops RESPIRATORY:  Clear to auscultation without rales, wheezing or rhonchi  ABDOMEN: Soft, non-tender, non-distended MUSCULOSKELETAL:  No edema; No deformity  SKIN: Warm and dry NEUROLOGIC:  Alert and oriented x 3 PSYCHIATRIC:  Normal affect    Signed, Elaine Leiter, MD  05/01/2024 11:36 AM    [facility]

## 2024-05-01 ENCOUNTER — Ambulatory Visit: Attending: Cardiology | Admitting: Cardiology

## 2024-05-01 ENCOUNTER — Ambulatory Visit: Admitting: Cardiology

## 2024-05-01 ENCOUNTER — Ambulatory Visit: Attending: Cardiology

## 2024-05-01 VITALS — BP 130/70 | HR 74 | Ht 63.0 in | Wt 168.6 lb

## 2024-05-01 DIAGNOSIS — M5412 Radiculopathy, cervical region: Secondary | ICD-10-CM | POA: Insufficient documentation

## 2024-05-01 DIAGNOSIS — I10 Essential (primary) hypertension: Secondary | ICD-10-CM | POA: Diagnosis not present

## 2024-05-01 DIAGNOSIS — I2510 Atherosclerotic heart disease of native coronary artery without angina pectoris: Secondary | ICD-10-CM

## 2024-05-01 DIAGNOSIS — E782 Mixed hyperlipidemia: Secondary | ICD-10-CM | POA: Diagnosis not present

## 2024-05-01 DIAGNOSIS — R002 Palpitations: Secondary | ICD-10-CM

## 2024-05-01 DIAGNOSIS — Z789 Other specified health status: Secondary | ICD-10-CM | POA: Diagnosis not present

## 2024-05-01 DIAGNOSIS — M5414 Radiculopathy, thoracic region: Secondary | ICD-10-CM | POA: Insufficient documentation

## 2024-05-01 DIAGNOSIS — Z981 Arthrodesis status: Secondary | ICD-10-CM | POA: Insufficient documentation

## 2024-05-01 MED ORDER — NITROGLYCERIN 0.4 MG SL SUBL: 0.4000 mg | SUBLINGUAL_TABLET | SUBLINGUAL | 1 refills | Status: AC | PRN

## 2024-05-01 NOTE — Patient Instructions (Signed)
 Medication Instructions:  Your physician recommends that you continue on your current medications as directed. Please refer to the Current Medication list given to you today.  *If you need a refill on your cardiac medications before your next appointment, please call your pharmacy*  Lab Work: NONE If you have labs (blood work) drawn today and your tests are completely normal, you will receive your results only by: MyChart Message (if you have MyChart) OR A paper copy in the mail If you have any lab test that is abnormal or we need to change your treatment, we will call you to review the results.  Testing/Procedures: Your physician has requested that you have an echocardiogram. Echocardiography is a painless test that uses sound waves to create images of your heart. It provides your doctor with information about the size and shape of your heart and how well your heart's chambers and valves are working. This procedure takes approximately one hour. There are no restrictions for this procedure. Please do NOT wear cologne, perfume, aftershave, or lotions (deodorant is allowed). Please arrive 15 minutes prior to your appointment time.  Please note: We ask at that you not bring children with you during ultrasound (echo/ vascular) testing. Due to room size and safety concerns, children are not allowed in the ultrasound rooms during exams. Our front office staff cannot provide observation of children in our lobby area while testing is being conducted. An adult accompanying a patient to their appointment will only be allowed in the ultrasound room at the discretion of the ultrasound technician under special circumstances. We apologize for any inconvenience.   You have been asked to wear a Zio Heart Monitor today. It is to be worn for 7 days. Please remove the monitor on 11/24 and mail back in the box provided.  If you have any questions about the monitor please call the company at [phone]     Follow-Up: At [facility], you and your health needs are our priority.  As part of our continuing mission to provide you with exceptional heart care, our providers are all part of one team.  This team includes your primary Cardiologist (physician) and Advanced Practice Providers or APPs (Physician Assistants and Nurse Practitioners) who all work together to provide you with the care you need, when you need it.  Your next appointment:   6 month(s)  Provider:   Redell Leiter, MD    We recommend signing up for the patient portal called MyChart.  Sign up information is provided on this After Visit Summary.  MyChart is used to connect with patients for Virtual Visits (Telemedicine).  Patients are able to view lab/test results, encounter notes, upcoming appointments, etc.  Non-urgent messages can be sent to your provider as well.   To learn more about what you can do with MyChart, go to [email or web address].   Other Instructions

## 2024-05-21 ENCOUNTER — Ambulatory Visit: Payer: Self-pay | Admitting: Cardiology

## 2024-05-21 DIAGNOSIS — R002 Palpitations: Secondary | ICD-10-CM

## 2024-05-22 ENCOUNTER — Ambulatory Visit: Attending: Cardiology

## 2024-05-22 DIAGNOSIS — I2510 Atherosclerotic heart disease of native coronary artery without angina pectoris: Secondary | ICD-10-CM

## 2024-05-22 LAB — ECHOCARDIOGRAM COMPLETE
AR max vel: 1.71 cm2
AV Area VTI: 1.85 cm2
AV Area mean vel: 1.73 cm2
AV Mean grad: 3.8 mmHg
AV Peak grad: 7.6 mmHg
Ao pk vel: 1.38 m/s
Area-P 1/2: 3.28 cm2
MV VTI: 1.23 cm2
S' Lateral: 2.2 cm

## 2024-05-22 NOTE — Telephone Encounter (Signed)
 Pt returning call

## 2024-05-28 ENCOUNTER — Other Ambulatory Visit: Payer: Self-pay | Admitting: Cardiology

## 2024-06-01 ENCOUNTER — Ambulatory Visit: Admitting: Cardiology

## 2024-07-02 ENCOUNTER — Other Ambulatory Visit: Payer: Self-pay | Admitting: Cardiology
